# Patient Record
Sex: Male | Born: 1935 | Race: White | Hispanic: No | Marital: Married | Smoking: Former smoker
Health system: Southern US, Community
[De-identification: ages and names within clinical notes are randomized; demographics above are authoritative.]

## PROBLEM LIST (undated history)

## (undated) DIAGNOSIS — C61 Malignant neoplasm of prostate: Secondary | ICD-10-CM

## (undated) DIAGNOSIS — I480 Paroxysmal atrial fibrillation: Principal | ICD-10-CM

## (undated) DIAGNOSIS — E079 Disorder of thyroid, unspecified: Secondary | ICD-10-CM

## (undated) DIAGNOSIS — I499 Cardiac arrhythmia, unspecified: Secondary | ICD-10-CM

## (undated) DIAGNOSIS — I10 Essential (primary) hypertension: Secondary | ICD-10-CM

## (undated) DIAGNOSIS — D509 Iron deficiency anemia, unspecified: Secondary | ICD-10-CM

## (undated) DIAGNOSIS — M1990 Unspecified osteoarthritis, unspecified site: Secondary | ICD-10-CM

## (undated) DIAGNOSIS — E7800 Pure hypercholesterolemia, unspecified: Secondary | ICD-10-CM

## (undated) DIAGNOSIS — K5790 Diverticulosis of intestine, part unspecified, without perforation or abscess without bleeding: Secondary | ICD-10-CM

## (undated) DIAGNOSIS — J45909 Unspecified asthma, uncomplicated: Secondary | ICD-10-CM

## (undated) DIAGNOSIS — Z889 Allergy status to unspecified drugs, medicaments and biological substances status: Secondary | ICD-10-CM

## (undated) HISTORY — DX: Essential (primary) hypertension: I10

## (undated) HISTORY — DX: Malignant neoplasm of prostate: C61

## (undated) HISTORY — PX: RETINAL DETACHMENT SURGERY: SHX105

## (undated) HISTORY — DX: Disorder of thyroid, unspecified: E07.9

## (undated) HISTORY — PX: APPENDECTOMY: SHX54

## (undated) HISTORY — DX: Pure hypercholesterolemia, unspecified: E78.00

## (undated) HISTORY — DX: Paroxysmal atrial fibrillation: I48.0

## (undated) HISTORY — DX: Iron deficiency anemia, unspecified: D50.9

## (undated) HISTORY — DX: Allergy status to unspecified drugs, medicaments and biological substances: Z88.9

## (undated) HISTORY — DX: Unspecified osteoarthritis, unspecified site: M19.90

## (undated) HISTORY — PX: HERNIA REPAIR: SHX51

## (undated) HISTORY — DX: Diverticulosis of intestine, part unspecified, without perforation or abscess without bleeding: K57.90

## (undated) HISTORY — DX: Unspecified asthma, uncomplicated: J45.909

---

## 1998-11-25 ENCOUNTER — Ambulatory Visit (HOSPITAL_COMMUNITY): Admission: RE | Admit: 1998-11-25 | Discharge: 1998-11-26 | Payer: Self-pay | Admitting: Ophthalmology

## 1998-11-25 ENCOUNTER — Encounter: Payer: Self-pay | Admitting: Ophthalmology

## 2001-04-14 ENCOUNTER — Ambulatory Visit (HOSPITAL_COMMUNITY): Admission: RE | Admit: 2001-04-14 | Discharge: 2001-04-14 | Payer: Self-pay | Admitting: Gastroenterology

## 2001-04-14 ENCOUNTER — Encounter (INDEPENDENT_AMBULATORY_CARE_PROVIDER_SITE_OTHER): Payer: Self-pay | Admitting: Specialist

## 2001-10-17 ENCOUNTER — Ambulatory Visit (HOSPITAL_BASED_OUTPATIENT_CLINIC_OR_DEPARTMENT_OTHER): Admission: RE | Admit: 2001-10-17 | Discharge: 2001-10-17 | Payer: Self-pay | Admitting: Surgery

## 2010-09-04 ENCOUNTER — Ambulatory Visit
Admission: RE | Admit: 2010-09-04 | Discharge: 2010-12-03 | Payer: Self-pay | Source: Home / Self Care | Attending: Radiation Oncology | Admitting: Radiation Oncology

## 2010-09-14 ENCOUNTER — Ambulatory Visit (HOSPITAL_COMMUNITY): Admission: RE | Admit: 2010-09-14 | Discharge: 2010-09-14 | Payer: Self-pay | Admitting: Urology

## 2010-10-23 ENCOUNTER — Ambulatory Visit (HOSPITAL_BASED_OUTPATIENT_CLINIC_OR_DEPARTMENT_OTHER)
Admission: RE | Admit: 2010-10-23 | Discharge: 2010-10-23 | Payer: Self-pay | Source: Home / Self Care | Admitting: Urology

## 2010-12-05 ENCOUNTER — Ambulatory Visit
Admission: RE | Admit: 2010-12-05 | Discharge: 2011-01-09 | Payer: Self-pay | Source: Home / Self Care | Attending: Radiation Oncology | Admitting: Radiation Oncology

## 2011-01-10 ENCOUNTER — Ambulatory Visit: Payer: Medicare Other | Admitting: Radiation Oncology

## 2011-02-20 LAB — CBC
HCT: 41.2 % (ref 39.0–52.0)
Hemoglobin: 14.1 g/dL (ref 13.0–17.0)
MCH: 33.3 pg (ref 26.0–34.0)
MCHC: 34.1 g/dL (ref 30.0–36.0)
MCV: 97.6 fL (ref 78.0–100.0)
Platelets: 318 10*3/uL (ref 150–400)
RBC: 4.22 MIL/uL (ref 4.22–5.81)
RDW: 12.9 % (ref 11.5–15.5)
WBC: 7.3 10*3/uL (ref 4.0–10.5)

## 2011-02-20 LAB — COMPREHENSIVE METABOLIC PANEL
ALT: 21 U/L (ref 0–53)
AST: 31 U/L (ref 0–37)
Albumin: 3.8 g/dL (ref 3.5–5.2)
Alkaline Phosphatase: 73 U/L (ref 39–117)
BUN: 26 mg/dL — ABNORMAL HIGH (ref 6–23)
CO2: 29 mEq/L (ref 19–32)
Calcium: 9.4 mg/dL (ref 8.4–10.5)
Chloride: 96 mEq/L (ref 96–112)
Creatinine, Ser: 1.43 mg/dL (ref 0.4–1.5)
GFR calc Af Amer: 58 mL/min — ABNORMAL LOW (ref >60)
GFR calc non Af Amer: 48 mL/min — ABNORMAL LOW (ref >60)
Glucose, Bld: 101 mg/dL — ABNORMAL HIGH (ref 70–99)
Potassium: 4.3 mEq/L (ref 3.5–5.1)
Sodium: 134 mEq/L — ABNORMAL LOW (ref 135–145)
Total Bilirubin: 0.7 mg/dL (ref 0.3–1.2)
Total Protein: 7.3 g/dL (ref 6.0–8.3)

## 2011-02-20 LAB — PROTIME-INR
INR: 0.93 (ref 0.00–1.49)
Prothrombin Time: 12.7 seconds (ref 11.6–15.2)

## 2011-04-27 NOTE — Procedures (Signed)
[place]  Patient:    Roy David, Roy David                         MRN: [id number] Proc. Date: 04/14/01 Adm. Date:  [id number] Attending:  Rich Brave CC:         Abran Cantor. Clovis Riley, M.D.   Procedure Report  PROCEDURE:  Colonoscopy with biopsy.  INDICATION:  A 77 year old with Hemoccult-positive stool and a history of intermittent small-volume hematochezia.  He is on Advil twice a day.  FINDINGS:  Diminutive polyp at 40 cm.  Extensive sigmoid diverticulosis.  DESCRIPTION OF PROCEDURE:  The nature, purpose, and risks of the procedure had been discussed with the patient, who provided written consent.  Sedation was fentanyl 75 mcg and Versed 6 mg IV prior to the procedure without arrhythmias or desaturation.  Digital exam of the prostate was unremarkable.  The Olympus adult video colonoscope was advanced through a somewhat fixated, tortuous sigmoid region due to diverticular disease, but with the patient on his back and some external abdominal compression, we were able to reach the cecum and then advance for a short distance into a normal-appearing terminal ileum, whereupon pullback was initiated.  The quality of the prep was very good, and it is felt that all areas were well-seen.  At about 40 cm, I encountered what appeared to be a sessile 3 mm hyperplastic-appearing polyp, although it may have simply been a mucosal tag. I cold biopsied it one time, and this seemed to essentially excise it.  There was quite a bit of sigmoid diverticular disease.  Retroflexion in the rectum was unremarkable.  Pullout through the anal canal demonstrated mild internal hemorrhoids.  No large polyps, cancer, colitis, or vascular malformations were observed during this exam, and the patient tolerated it well.  There were no apparent complications.  IMPRESSION: 1. Probable colon polyp. 2. Sigmoid diverticulosis. 3. Mild internal hemorrhoids, which might  account for intermittent    small-volume hematochezia. 4. No evident cause for heme-positive stool seen.  It is presumed to be due    to gastric irritation from his Advil.  PLAN:  Await pathology on the polyp with consideration for repeat colonoscopy in five years if it is adenomatous in Editor, commissioning. DD:  04/14/01 TD:  04/15/01 Job: [id number]

## 2011-04-27 NOTE — Op Note (Signed)
[place]  Patient:    Roy David, Roy David Visit Number: [id number] MRN: [id number]          Service Type: DSU Location: [facility] Attending Physician:  Charlton Haws Dictated by:   Currie Paris, M.D. Proc. Date: 10/17/01 Admit Date:  10/17/2001   CC:         Melvyn Neth D. Clovis Riley, M.D.   Operative Report  VISIT NUMBER:  [id number]  CCS NUMBER:  [id number]  PREOPERATIVE DIAGNOSIS:  Right inguinal hernia.  POSTOPERATIVE DIAGNOSIS:  Right inguinal hernia.  OPERATION:  Repair of right inguinal hernia with mesh.  SURGEON:  Currie Paris, M.D.  ANESTHESIA:  MAC.  CLINICAL HISTORY:  The patient is a 77 year old who has had a prior left inguinal hernia, and had now presented with a right inguinal hernia which was symptomatic, and he wished to have fixed.  DESCRIPTION OF PROCEDURE:  The patient was seen in the holding area and the operative side identified and marked.  He was taken to the operating room.  He was given IV sedation.  The groin area was prepped and draped.  A combination of 1% Xylocaine with epinephrine with 0.5% Marcaine plain was mixed and used for local.  It was infiltrated along the incision line in a subfascial level at the anterior-superior iliac spine.  A skin incision was made and deepened to the external oblique aponeurosis with bleeders either tied with 4-0 Vicryl or electrocoagulated.  The external oblique was opened in line of its fibers.  The cord was dissected up off the inguinal floor.  There was what appeared to be some preperitoneal fat protruding through the ring, right in the area where there was a hernia, and I did strip this off and simply reduced it, and there appeared to be an indirect defect.  However, the inguinal floor was also quite attenuated.  I took a small mesh plug and actually took one of the three layers off to make it a little smaller, and used it to occlude the deep ring, and held it  in place with a 2-0 Prolene.  The fascia was then overlaid and sutured in with a running suture starting medially and running to the level of the deep ring, and then tacked along the medial aspect, and the superior aspect with several sutures.  Tails were crossed to go around the cord and tacked laterally.  This produced a nice coverage in a tension-free manner.  Everything appeared to be dry.  The external oblique was closed over with 3-0 Vicryl, the Scarpas with 3-0 Vicryl, and the skin with 4-0 Monocryl subcuticular plus Steri-Strips.  The patient tolerated the procedure well.  There were no operative complications, and all counts were correct. Dictated by:   Currie Paris, M.D. Attending Physician:  Charlton Haws DD:  10/17/01 TD:  10/19/01 Job: [id number]

## 2011-12-06 ENCOUNTER — Encounter: Payer: Self-pay | Admitting: *Deleted

## 2011-12-06 NOTE — Progress Notes (Signed)
I-PSS results 09/04/10=1111112 score=8

## 2014-10-01 ENCOUNTER — Other Ambulatory Visit (HOSPITAL_COMMUNITY): Payer: Self-pay | Admitting: Family Medicine

## 2014-10-01 ENCOUNTER — Ambulatory Visit (HOSPITAL_COMMUNITY): Payer: Medicare Other | Attending: Cardiology | Admitting: Cardiology

## 2014-10-01 DIAGNOSIS — I350 Nonrheumatic aortic (valve) stenosis: Secondary | ICD-10-CM

## 2014-10-01 DIAGNOSIS — I10 Essential (primary) hypertension: Secondary | ICD-10-CM | POA: Insufficient documentation

## 2014-10-01 DIAGNOSIS — E785 Hyperlipidemia, unspecified: Secondary | ICD-10-CM | POA: Diagnosis not present

## 2014-10-01 NOTE — Progress Notes (Signed)
Echo performed. 

## 2014-11-19 ENCOUNTER — Other Ambulatory Visit: Payer: Self-pay | Admitting: Physician Assistant

## 2015-02-25 ENCOUNTER — Other Ambulatory Visit: Payer: Self-pay | Admitting: Gastroenterology

## 2015-08-17 ENCOUNTER — Other Ambulatory Visit (HOSPITAL_COMMUNITY): Payer: Self-pay | Admitting: Ophthalmology

## 2015-08-17 ENCOUNTER — Ambulatory Visit (HOSPITAL_COMMUNITY)
Admission: RE | Admit: 2015-08-17 | Discharge: 2015-08-17 | Disposition: A | Discharge status: Home / Self Care | Payer: Medicare Other | Source: Ambulatory Visit | Attending: Ophthalmology | Admitting: Ophthalmology

## 2015-08-17 DIAGNOSIS — H539 Unspecified visual disturbance: Secondary | ICD-10-CM

## 2015-08-17 DIAGNOSIS — H538 Other visual disturbances: Secondary | ICD-10-CM | POA: Diagnosis not present

## 2015-08-17 NOTE — Progress Notes (Signed)
VASCULAR LAB PRELIMINARY  PRELIMINARY  PRELIMINARY  PRELIMINARY  Carotid duplex  completed.    Preliminary report:  Bilateral:  1-39% ICA stenosis.  Vertebral artery flow is antegrade.      Dishon Kehoe, RVT 08/17/2015, 3:48 PM

## 2015-09-13 ENCOUNTER — Telehealth: Payer: Self-pay | Admitting: Cardiovascular Disease

## 2015-09-13 NOTE — Telephone Encounter (Signed)
Received records from Eugenio Saenz for appointment on 10/17/15 with Dr Sallyanne Kuster.  Records given to [facility] (medical records) for Dr Croitoru's schedule on 10/17/15. lp

## 2015-10-17 ENCOUNTER — Ambulatory Visit: Payer: Medicare Other | Admitting: Cardiovascular Disease

## 2015-10-31 ENCOUNTER — Ambulatory Visit: Payer: Medicare Other | Admitting: Cardiovascular Disease

## 2015-11-23 ENCOUNTER — Encounter: Payer: Self-pay | Admitting: Cardiovascular Disease

## 2015-11-23 ENCOUNTER — Ambulatory Visit (INDEPENDENT_AMBULATORY_CARE_PROVIDER_SITE_OTHER): Payer: Medicare Other | Admitting: Cardiovascular Disease

## 2015-11-23 VITALS — BP 134/86 | HR 83 | Ht 68.0 in | Wt 158.0 lb

## 2015-11-23 DIAGNOSIS — I10 Essential (primary) hypertension: Secondary | ICD-10-CM

## 2015-11-23 DIAGNOSIS — I350 Nonrheumatic aortic (valve) stenosis: Secondary | ICD-10-CM

## 2015-11-23 DIAGNOSIS — H3411 Central retinal artery occlusion, right eye: Secondary | ICD-10-CM

## 2015-11-23 DIAGNOSIS — E785 Hyperlipidemia, unspecified: Secondary | ICD-10-CM

## 2015-11-23 DIAGNOSIS — E7800 Pure hypercholesterolemia, unspecified: Secondary | ICD-10-CM | POA: Insufficient documentation

## 2015-11-23 NOTE — Patient Instructions (Addendum)
Your physician has recommended that you receive an implantable loop recorder.  Loop recorders are medical devices that record the heart's electrical activity. Doctors most often use these monitors to diagnose arrhythmias, especially to diagnose the cause for unexplained stroke. Arrhythmias are problems with the speed or rhythm of the heartbeat. The monitor is a small device,  Implanted under the skin of the chest using local anesthesia.   Your physician recommends that you schedule a follow-up appointment in:  3 months

## 2015-11-23 NOTE — Addendum Note (Signed)
Addended by: Sanda Klein on: 11/23/2015 04:38 PM   Modules accepted: Orders

## 2015-11-23 NOTE — Progress Notes (Signed)
Cardiology Office Note   Date:  11/23/2015   ID:  Roy David, DOB 08/02/1930, MRN [id number]  PCP:  Roy Coffin, MD  Cardiologist:   Roy Casino, MD   Chief Complaint  Patient presents with  . New Evaluation    Early September visual disturbance right eye.  Seen by Roy. Ellie David and told he did not have a detached retina.  Carotid doppler done showed less than 39% blockage.      History of Present Illness: Roy David is a 81 y.o. male who presents for evaluation of transient vision loss. He was seen by Roy. Ellie David and thought not to have a detatched retina. He subsequently was send to have carotid dopplers, due to concern for retinal artery occlusion, which showed very minimal carotid disease. He has a history of hypertension, dyslipidemia, detatched retina in the past, hypothyroidism, seasonal allergies, bilateral knee OA and history of prostate cancer in 2011 (with radiation seed implant). Currently, he reports some central vision loss. He denies any chest pain, shortness of breath, palpitations, presyncope or syncope. He was told that he had a heart murmur about 14 years ago - he did have an echocardiogram in 09/2014, which demonstrated moderate aortic stenosis and mild AI, EF 60%. He was not taking daily aspirin at the time of the event.  Past Medical History  Diagnosis Date  . Hypertension   . Hypercholesteremia   . Thyroid disease     hypothyroidism  . H/O seasonal allergies     Roy Roy David  . Asthma   . Diverticulosis   . Arthritis   . Prostate cancer [facility])     12/10- Roy David/Roy David    No past surgical history on file.  Current Outpatient Prescriptions  Medication Sig Dispense Refill  . aspirin 81 MG tablet Take 81 mg by mouth daily.    Marland Kitchen ezetimibe-simvastatin (VYTORIN) 10-20 MG per tablet Take 1 tablet by mouth daily.    Marland Kitchen levothyroxine (SYNTHROID, LEVOTHROID) 112 MCG tablet Take 1 tablet by mouth daily.    Marland Kitchen lisinopril-hydrochlorothiazide  (PRINZIDE,ZESTORETIC) 20-12.5 MG per tablet Take 1 tablet by mouth daily.    . mometasone (ASMANEX 60 METERED DOSES) 220 MCG/INH inhaler Inhale 1 Inhaler into the lungs daily.     No current facility-administered medications for this visit.    Allergies:   Review of patient's allergies indicates no known allergies.   Social History:  The patient  reports that he quit smoking about 55 years ago. He does not have any smokeless tobacco history on file.   Family History:  The patient's family history includes Heart attack in his mother; Stroke in his father and sister.   ROS:   Please see the history of present illness.    Review of Systems  Respiratory: Positive for cough.    All other systems reviewed and are negative.   PHYSICAL EXAM: VS:  BP 134/86 mmHg  Pulse 83  Ht 5\' 8"  (1.727 m)  Wt 158 lb (71.668 kg)  BMI 24.03 kg/m2 , Body mass index is 24.03 kg/(m^2). GEN: Well nourished, well developed, in no acute distress HEENT: normal Neck: no JVD, bilateteral carotid bruits or masses Cardiac: RRR; early peaking 2/6 SEM, rubs, or gallops,no edema  Respiratory:  clear to auscultation bilaterally, normal work of breathing GI: soft, nontender, nondistended, + BS MS: no deformity or atrophy Skin: warm and dry, no rash Neuro:  Alert and Oriented x 3, Strength and sensation are intact Psych: euthymic mood, full  affect   EKG:  EKG is ordered today.  The ekg ordered today demonstrates  Normal sinus rhythm with first-degree AV block  Recent Labs: No results found for requested labs within last 365 days.   Lipid Panel  HDL 73, LDL 67, total cholesterol 153, triglycerides 68, creatinine 1.22   Wt Readings from Last 3 Encounters:  11/23/15 158 lb (71.668 kg)     Other studies Reviewed: Additional studies/ records that were reviewed today include: carotid duplex US. Review of the above records demonstrates: Summary:  Carotid doppler (08/17/2015): - The vertebral arteries appear  patent with antegrade flow. - Findings consistent with 1-39 percent stenosis involving the  right internal carotid artery and the left internal carotid  artery.  Echocardiogram (10/01/14):  Study Conclusions  - Left ventricle: The cavity size was normal. There was mild focal basal hypertrophy of the septum. Systolic function was normal. The estimated ejection fraction was in the range of 55% to 60%. Wall motion was normal; there were no regional wall motion abnormalities. Doppler parameters are consistent with abnormal left ventricular relaxation (grade 1 diastolic dysfunction). Doppler parameters are consistent with high ventricular filling pressure. - Aortic valve: Cusp separation was reduced. There was moderate stenosis. There was mild regurgitation. Mean gradient (S): 20 mm Hg. Valve area (Vmax): 1.17 cm^2. - Mitral valve: Moderately calcified annulus. - Left atrium: The atrium was mildly dilated. - Right ventricle: The cavity size was mildly dilated. Wall thickness was normal.  ASSESSMENT AND PLAN:  1. Central retinal artery occlusion of right eye   2. Central retinal artery occlusion of right eye   3. Moderate aortic valve stenosis   4. Essential hypertension   5. Hyperlipidemia     Roy. David is describing right eye central retinal artery occlusion , the equivalent of a stroke. Carotid duplex ultrasonography has not shown a cause. He has well treated hypertension and hyperlipidemia. Consider asymptomatic atrial fibrillation. We discussed options of wearable event monitor versus implantable loop recorder , I recommended the latter. The procedure was discussed in detail and he agrees to proceed with it.This procedure has been fully reviewed with the patient and written informed consent has been obtained.   He has clear evidence of moderate aortic valve stenosis by echocardiography and physical exam, but this is currently asymptomatic disorder, unlikely to  be related to his visual problem.   He has well treated hypertension and hyperlipidemia.   Medication Adjustments/Labs and Tests Ordered: Current medicines are reviewed at length with the patient today.  Concerns regarding medicines are outlined above.  Labs/Tests ordered today are listed below. Patient Instructions  Your physician has recommended that you receive an implantable loop recorder.  Loop recorders are medical devices that record the heart's electrical activity. Doctors most often use these monitors to diagnose arrhythmias, especially to diagnose the cause for unexplained stroke. Arrhythmias are problems with the speed or rhythm of the heartbeat. The monitor is a small device,  Implanted under the skin of the chest using local anesthesia.   Your physician recommends that you schedule a follow-up appointment in:  3 months     Signed, Roy Casino, MD  11/23/2015 3:27 PM    [place]

## 2015-11-25 ENCOUNTER — Other Ambulatory Visit: Payer: Self-pay | Admitting: *Deleted

## 2015-11-29 ENCOUNTER — Encounter (HOSPITAL_COMMUNITY)
Admission: RE | Disposition: A | Discharge status: Home / Self Care | Payer: Self-pay | Source: Ambulatory Visit | Attending: Cardiovascular Disease

## 2015-11-29 ENCOUNTER — Ambulatory Visit (HOSPITAL_COMMUNITY)
Admission: RE | Admit: 2015-11-29 | Discharge: 2015-11-29 | Disposition: A | Discharge status: Home / Self Care | Payer: Medicare Other | Source: Ambulatory Visit | Attending: Cardiovascular Disease | Admitting: Cardiovascular Disease

## 2015-11-29 DIAGNOSIS — E039 Hypothyroidism, unspecified: Secondary | ICD-10-CM | POA: Insufficient documentation

## 2015-11-29 DIAGNOSIS — I639 Cerebral infarction, unspecified: Secondary | ICD-10-CM | POA: Diagnosis not present

## 2015-11-29 DIAGNOSIS — Z8546 Personal history of malignant neoplasm of prostate: Secondary | ICD-10-CM | POA: Insufficient documentation

## 2015-11-29 DIAGNOSIS — J45909 Unspecified asthma, uncomplicated: Secondary | ICD-10-CM | POA: Diagnosis not present

## 2015-11-29 DIAGNOSIS — E7800 Pure hypercholesterolemia, unspecified: Secondary | ICD-10-CM | POA: Diagnosis not present

## 2015-11-29 DIAGNOSIS — Z7982 Long term (current) use of aspirin: Secondary | ICD-10-CM | POA: Insufficient documentation

## 2015-11-29 DIAGNOSIS — G459 Transient cerebral ischemic attack, unspecified: Secondary | ICD-10-CM | POA: Diagnosis not present

## 2015-11-29 DIAGNOSIS — I350 Nonrheumatic aortic (valve) stenosis: Secondary | ICD-10-CM | POA: Insufficient documentation

## 2015-11-29 DIAGNOSIS — H3410 Central retinal artery occlusion, unspecified eye: Secondary | ICD-10-CM | POA: Insufficient documentation

## 2015-11-29 DIAGNOSIS — Z87891 Personal history of nicotine dependence: Secondary | ICD-10-CM | POA: Insufficient documentation

## 2015-11-29 DIAGNOSIS — M170 Bilateral primary osteoarthritis of knee: Secondary | ICD-10-CM | POA: Diagnosis not present

## 2015-11-29 DIAGNOSIS — H3411 Central retinal artery occlusion, right eye: Secondary | ICD-10-CM | POA: Diagnosis present

## 2015-11-29 DIAGNOSIS — I10 Essential (primary) hypertension: Secondary | ICD-10-CM | POA: Diagnosis not present

## 2015-11-29 HISTORY — PX: EP IMPLANTABLE DEVICE: SHX172B

## 2015-11-29 SURGERY — LOOP RECORDER INSERTION
Anesthesia: LOCAL

## 2015-11-29 MED ORDER — LIDOCAINE HCL (PF) 1 % IJ SOLN
INTRAMUSCULAR | Status: DC | PRN
  Administered 2015-11-29: 20 mL

## 2015-11-29 SURGICAL SUPPLY — 2 items
LOOP REVEAL LINQSYS (Prosthesis & Implant Heart) ×2 IMPLANT
PACK LOOP INSERTION (CUSTOM PROCEDURE TRAY) ×2 IMPLANT

## 2015-11-29 NOTE — Interval H&P Note (Signed)
History and Physical Interval Note:  11/29/2015 1:30 PM  Roy David  has presented today for surgery, with the diagnosis of Palpations, blurred vision  The various methods of treatment have been discussed with the patient and family. After consideration of risks, benefits and other options for treatment, the patient has consented to  Procedure(s): Loop Recorder Insertion (N/A) as a surgical intervention .  The patient's history has been reviewed, patient examined, no change in status, stable for surgery.  I have reviewed the patient's chart and labs.  Questions were answered to the patient's satisfaction.     Leaf [employer]

## 2015-11-29 NOTE — Op Note (Addendum)
LOOP RECORDER IMPLANT    Procedure performed:  Loop recorder implantation   Reason for procedure:  1. Cryptogenic stroke equivalent - central retinal artery embolic occlusion Procedure performed by:  Sanda Klein, MD  Complications:  None  Estimated blood loss:  <5 mL  Medications administered during procedure:  Lidocaine 1% with 1/10,000 epinephrine 10 mL locally Device details:  Medtronic Reveal Linq model number [id number], serial number [id number] Procedure details:  After the risks and benefits of the procedure were discussed the patient provided informed consent. The patient was prepped and draped in usual sterile fashion. Local anesthesia was administered to an area 2 cm to the left of the sternum in the 4th intercostal space. A cutaneous incision was made using the incision tool. The introducer was then used to create a subcutaneous tunnel and carefully deploy the device. Local pressure was held to ensure hemostasis.  The incision was closed with SteriStrips and a sterile dressing was applied.  R waves 0.39 mV  Sanda Klein, MD, [facility] [phone] office [phone] pager 11/29/2015 3:56 PM

## 2015-11-29 NOTE — Progress Notes (Signed)
Procedure completed.  Pt tolerated well. Discharge instructions given verbally and in writing. Pt and family verbalize understanding.  Pt discharged to home with wife.

## 2015-11-29 NOTE — H&P (View-Only) (Signed)
Cardiology Office Note   Date:  11/23/2015   ID:  Roy David, DOB Feb 17, 1930, MRN [id number]  PCP:  Roy Coffin, MD  Cardiologist:   Roy Casino, MD   Chief Complaint  Patient presents with  . New Evaluation    Early September visual disturbance right eye.  Seen by Dr. Ellie Lunch and told he did not have a detached retina.  Carotid doppler done showed less than 39% blockage.      History of Present Illness: Roy David is a 81 y.o. male who presents for evaluation of transient vision loss. He was seen by Dr. Ellie Lunch and thought not to have a detatched retina. He subsequently was send to have carotid dopplers, due to concern for retinal artery occlusion, which showed very minimal carotid disease. He has a history of hypertension, dyslipidemia, detatched retina in the past, hypothyroidism, seasonal allergies, bilateral knee OA and history of prostate cancer in 2011 (with radiation seed implant). Currently, he reports some central vision loss. He denies any chest pain, shortness of breath, palpitations, presyncope or syncope. He was told that he had a heart murmur about 14 years ago - he did have an echocardiogram in 09/2014, which demonstrated moderate aortic stenosis and mild AI, EF 60%. He was not taking daily aspirin at the time of the event.  Past Medical History  Diagnosis Date  . Hypertension   . Hypercholesteremia   . Thyroid disease     hypothyroidism  . H/O seasonal allergies     Dr Roy David  . Asthma   . Diverticulosis   . Arthritis   . Prostate cancer [facility])     12/10- Dr Roy David/Dr Roy David    No past surgical history on file.  Current Outpatient Prescriptions  Medication Sig Dispense Refill  . aspirin 81 MG tablet Take 81 mg by mouth daily.    Marland Kitchen ezetimibe-simvastatin (VYTORIN) 10-20 MG per tablet Take 1 tablet by mouth daily.    Marland Kitchen levothyroxine (SYNTHROID, LEVOTHROID) 112 MCG tablet Take 1 tablet by mouth daily.    Marland Kitchen lisinopril-hydrochlorothiazide  (PRINZIDE,ZESTORETIC) 20-12.5 MG per tablet Take 1 tablet by mouth daily.    . mometasone (ASMANEX 60 METERED DOSES) 220 MCG/INH inhaler Inhale 1 Inhaler into the lungs daily.     No current facility-administered medications for this visit.    Allergies:   Review of patient's allergies indicates no known allergies.   Social History:  The patient  reports that he quit smoking about 55 years ago. He does not have any smokeless tobacco history on file.   Family History:  The patient's family history includes Heart attack in his mother; Stroke in his father and sister.   ROS:   Please see the history of present illness.    Review of Systems  Respiratory: Positive for cough.    All other systems reviewed and are negative.   PHYSICAL EXAM: VS:  BP 134/86 mmHg  Pulse 83  Ht 5\' 8"  (1.727 m)  Wt 158 lb (71.668 kg)  BMI 24.03 kg/m2 , Body mass index is 24.03 kg/(m^2). GEN: Well nourished, well developed, in no acute distress HEENT: normal Neck: no JVD, bilateteral carotid bruits or masses Cardiac: RRR; early peaking 2/6 SEM, rubs, or gallops,no edema  Respiratory:  clear to auscultation bilaterally, normal work of breathing GI: soft, nontender, nondistended, + BS MS: no deformity or atrophy Skin: warm and dry, no rash Neuro:  Alert and Oriented x 3, Strength and sensation are intact Psych: euthymic mood, full  affect   EKG:  EKG is ordered today.  The ekg ordered today demonstrates  Normal sinus rhythm with first-degree AV block  Recent Labs: No results found for requested labs within last 365 days.   Lipid Panel  HDL 73, LDL 67, total cholesterol 153, triglycerides 68, creatinine 1.22   Wt Readings from Last 3 Encounters:  11/23/15 158 lb (71.668 kg)     Other studies Reviewed: Additional studies/ records that were reviewed today include: carotid duplex US. Review of the above records demonstrates: Summary:  Carotid doppler (08/17/2015): - The vertebral arteries appear  patent with antegrade flow. - Findings consistent with 1-39 percent stenosis involving the  right internal carotid artery and the left internal carotid  artery.  Echocardiogram (10/01/14):  Study Conclusions  - Left ventricle: The cavity size was normal. There was mild focal basal hypertrophy of the septum. Systolic function was normal. The estimated ejection fraction was in the range of 55% to 60%. Wall motion was normal; there were no regional wall motion abnormalities. Doppler parameters are consistent with abnormal left ventricular relaxation (grade 1 diastolic dysfunction). Doppler parameters are consistent with high ventricular filling pressure. - Aortic valve: Cusp separation was reduced. There was moderate stenosis. There was mild regurgitation. Mean gradient (S): 20 mm Hg. Valve area (Vmax): 1.17 cm^2. - Mitral valve: Moderately calcified annulus. - Left atrium: The atrium was mildly dilated. - Right ventricle: The cavity size was mildly dilated. Wall thickness was normal.  ASSESSMENT AND PLAN:  1. Central retinal artery occlusion of right eye   2. Central retinal artery occlusion of right eye   3. Moderate aortic valve stenosis   4. Essential hypertension   5. Hyperlipidemia     [name] is describing right eye central retinal artery occlusion , the equivalent of a stroke. Carotid duplex ultrasonography has not shown a cause. He has well treated hypertension and hyperlipidemia. Consider asymptomatic atrial fibrillation. We discussed options of wearable event monitor versus implantable loop recorder , I recommended the latter. The procedure was discussed in detail and he agrees to proceed with it.This procedure has been fully reviewed with the patient and written informed consent has been obtained.   He has clear evidence of moderate aortic valve stenosis by echocardiography and physical exam, but this is currently asymptomatic disorder, unlikely to  be related to his visual problem.   He has well treated hypertension and hyperlipidemia.   Medication Adjustments/Labs and Tests Ordered: Current medicines are reviewed at length with the patient today.  Concerns regarding medicines are outlined above.  Labs/Tests ordered today are listed below. Patient Instructions  Your physician has recommended that you receive an implantable loop recorder.  Loop recorders are medical devices that record the heart's electrical activity. Doctors most often use these monitors to diagnose arrhythmias, especially to diagnose the cause for unexplained stroke. Arrhythmias are problems with the speed or rhythm of the heartbeat. The monitor is a small device,  Implanted under the skin of the chest using local anesthesia.   Your physician recommends that you schedule a follow-up appointment in:  3 months     Signed, Roy Casino, MD  11/23/2015 3:27 PM    [place]

## 2015-11-29 NOTE — Discharge Instructions (Signed)
Loop instructions given to family and pt

## 2015-11-30 ENCOUNTER — Encounter (HOSPITAL_COMMUNITY): Payer: Self-pay | Admitting: Cardiovascular Disease

## 2015-12-15 ENCOUNTER — Ambulatory Visit (INDEPENDENT_AMBULATORY_CARE_PROVIDER_SITE_OTHER): Payer: Medicare Other | Admitting: *Deleted

## 2015-12-15 DIAGNOSIS — H3411 Central retinal artery occlusion, right eye: Secondary | ICD-10-CM

## 2015-12-15 LAB — CUP PACEART INCLINIC DEVICE CHECK: Date Time Interrogation Session: 20170105143436

## 2015-12-15 NOTE — Progress Notes (Signed)
Loop wound check appointment. Steri-strips previously removed by patient. Small amount dried blood cleaned from R lateral incision, incision well healed and without redness or edema. Slight healing purplish to yellow bruising noted below incision. Small red bump noted slightly below R lateral incision, no pain or abnormal warmth, possible ingrown hair from having chest shaved per patient. Chanetta Marshall, NP, checked site, advised washing area well with soap and water. Patient aware to call with any worsening symptoms or signs of infection at site. R-waves 0.70mV. No symptom, tachy, brady, pause, or AF episodes. Monthly summary reports and ROV with Vincent on 02/28/16 at 10:30am.

## 2015-12-22 NOTE — Addendum Note (Signed)
Addended by: Therisa Doyne on: 12/22/2015 08:04 AM   Modules accepted: Orders

## 2015-12-29 ENCOUNTER — Ambulatory Visit (INDEPENDENT_AMBULATORY_CARE_PROVIDER_SITE_OTHER): Payer: Medicare Other | Admitting: *Deleted

## 2015-12-29 DIAGNOSIS — I639 Cerebral infarction, unspecified: Secondary | ICD-10-CM | POA: Diagnosis not present

## 2015-12-30 NOTE — Progress Notes (Signed)
Carelink Summary Report / Loop Recorder 

## 2016-01-09 ENCOUNTER — Telehealth: Payer: Self-pay | Admitting: *Deleted

## 2016-01-09 ENCOUNTER — Encounter: Payer: Self-pay | Admitting: *Deleted

## 2016-01-09 ENCOUNTER — Encounter: Payer: Self-pay | Admitting: Cardiovascular Disease

## 2016-01-09 ENCOUNTER — Ambulatory Visit: Payer: Medicare Other | Admitting: Cardiovascular Disease

## 2016-01-09 NOTE — Telephone Encounter (Signed)
Opened in error.  See documentation encounter.

## 2016-01-09 NOTE — Progress Notes (Signed)
AF episodes from 01/06/16 and 01/07/16 printed for review. Episode from 01/06/16 appropriate, true AF, duration 16min, +ASA 81mg . Dr. Sallyanne Kuster made aware. Monthly summary reports and ROV with Dr. Sallyanne Kuster on 02/28/16 at 10:30am.

## 2016-01-09 NOTE — Progress Notes (Signed)
Called Roy David and discuss findings on loop recorder. We debated whether or not it is time to start true anticoagulation instead of just aspirin. After some discussion, decided to defer this decision until his follow-up appointment in March.

## 2016-01-17 ENCOUNTER — Encounter: Payer: Self-pay | Admitting: Cardiovascular Disease

## 2016-01-22 ENCOUNTER — Encounter: Payer: Self-pay | Admitting: Cardiovascular Disease

## 2016-01-23 DIAGNOSIS — J069 Acute upper respiratory infection, unspecified: Secondary | ICD-10-CM | POA: Diagnosis not present

## 2016-01-30 ENCOUNTER — Ambulatory Visit (INDEPENDENT_AMBULATORY_CARE_PROVIDER_SITE_OTHER): Payer: Medicare Other | Admitting: *Deleted

## 2016-01-30 DIAGNOSIS — I639 Cerebral infarction, unspecified: Secondary | ICD-10-CM

## 2016-02-01 NOTE — Progress Notes (Signed)
Carelink Summary Report / Loop Recorder 

## 2016-02-02 ENCOUNTER — Encounter: Payer: Self-pay | Admitting: Cardiovascular Disease

## 2016-02-06 ENCOUNTER — Encounter: Payer: Self-pay | Admitting: Cardiovascular Disease

## 2016-02-07 ENCOUNTER — Encounter: Payer: Self-pay | Admitting: Cardiovascular Disease

## 2016-02-17 LAB — CUP PACEART REMOTE DEVICE CHECK: MDC IDC SESS DTM: 20170119200521

## 2016-02-27 ENCOUNTER — Ambulatory Visit (INDEPENDENT_AMBULATORY_CARE_PROVIDER_SITE_OTHER): Payer: Medicare Other | Admitting: *Deleted

## 2016-02-27 DIAGNOSIS — I639 Cerebral infarction, unspecified: Secondary | ICD-10-CM

## 2016-02-28 ENCOUNTER — Encounter: Payer: Medicare Other | Admitting: Cardiovascular Disease

## 2016-02-28 NOTE — Progress Notes (Signed)
Carelink Summary Report / Loop Recorder 

## 2016-03-06 ENCOUNTER — Ambulatory Visit (INDEPENDENT_AMBULATORY_CARE_PROVIDER_SITE_OTHER): Payer: Medicare Other | Admitting: Cardiovascular Disease

## 2016-03-06 ENCOUNTER — Encounter: Payer: Self-pay | Admitting: Cardiovascular Disease

## 2016-03-06 VITALS — BP 168/92 | HR 100 | Ht 68.5 in | Wt 157.6 lb

## 2016-03-06 DIAGNOSIS — I350 Nonrheumatic aortic (valve) stenosis: Secondary | ICD-10-CM

## 2016-03-06 DIAGNOSIS — H3411 Central retinal artery occlusion, right eye: Secondary | ICD-10-CM

## 2016-03-06 DIAGNOSIS — E785 Hyperlipidemia, unspecified: Secondary | ICD-10-CM

## 2016-03-06 DIAGNOSIS — I480 Paroxysmal atrial fibrillation: Secondary | ICD-10-CM

## 2016-03-06 DIAGNOSIS — I10 Essential (primary) hypertension: Secondary | ICD-10-CM | POA: Diagnosis not present

## 2016-03-06 HISTORY — DX: Paroxysmal atrial fibrillation: I48.0

## 2016-03-06 MED ORDER — APIXABAN 5 MG PO TABS: 5.0000 mg | ORAL_TABLET | Freq: Two times a day (BID) | ORAL | Status: DC

## 2016-03-06 NOTE — Progress Notes (Signed)
Patient ID: Roy David, male   DOB: 30-Jul-1930, 82 y.o.   MRN: [id number]    Cardiology Office Note    Date:  03/06/2016   ID:  Roy David, DOB [birth year], MRN [id number]  PCP:  Donnie Coffin, MD  Cardiologist:   Sanda Klein, MD   Chief Complaint  Patient presents with  . Follow-up    pt states no chest pain    History of Present Illness:  Roy David is a 82 y.o. 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 The device has now recorded 3 separate episodes of brief paroxysmal atrial fibrillation. Most recently he had a 4 minute episode on March 23 and a 10 minute episode on March 26. Both episodes showed extremely irregular rhythm with very high likelihood that this is atrial fibrillation. On both occasions it was with rapid ventricular response, up to about 170 bpm. He was unaware of palpitations. He thinks at least one of the events happened while he was working in the yard. He denies syncope, dizziness, lightheadedness, shortness of breath or angina pectoris. He has not had any new ophthalmological or neurological problems.  Additional problems include hypertension, hypercholesterolemia and moderate aortic valve stenosis. His blood pressure is quite elevated today. He states that at home this morning's blood pressure was 133/68 mmHg, a typical blood pressure for him. He offers that his blood pressure is often elevated when he comes to the doctor's office.  Past Medical History  Diagnosis Date  . Hypertension   . Hypercholesteremia   . Thyroid disease     hypothyroidism  . H/O seasonal allergies     Dr [name]  . Asthma   . Diverticulosis   . Arthritis   . Prostate cancer [facility])     12/10- Dr Dahlstedt/Dr Valere Dross    Past Surgical History  Procedure Laterality Date  . Ep implantable device N/A 11/29/2015    Procedure: Loop Recorder Insertion;  Surgeon: Sanda Klein,  MD;  Location: [place] [facility];  Service: Cardiovascular;  Laterality: N/A;    Outpatient Prescriptions Prior to Visit  Medication Sig Dispense Refill  . albuterol (PROVENTIL HFA;VENTOLIN HFA) 108 (90 BASE) MCG/ACT inhaler Inhale 1 puff into the lungs every 6 (six) hours as needed for wheezing or shortness of breath.    Marland Kitchen aspirin 81 MG tablet Take 81 mg by mouth daily.    Marland Kitchen ezetimibe-simvastatin (VYTORIN) 10-20 MG per tablet Take 1 tablet by mouth daily.    . hydrOXYzine (ATARAX/VISTARIL) 10 MG tablet Reported on 12/15/2015  2  . levothyroxine (SYNTHROID, LEVOTHROID) 112 MCG tablet Take 1 tablet by mouth daily.    Marland Kitchen lisinopril-hydrochlorothiazide (PRINZIDE,ZESTORETIC) 20-25 MG tablet Take 1 tablet by mouth daily.    . mometasone (ASMANEX 60 METERED DOSES) 220 MCG/INH inhaler Inhale 1 Inhaler into the lungs daily.    Marland Kitchen triamcinolone cream (KENALOG) 0.1 % Reported on 12/15/2015  2   No facility-administered medications prior to visit.     Allergies:   Bee venom   Social History   Social History  . Marital Status: Married    Spouse Name: N/A  . Number of Children: N/A  . Years of Education: N/A   Social History Main Topics  . Smoking status: Former Smoker    Quit date: 12/09/1959  . Smokeless tobacco: None  . Alcohol Use: 3.6 oz/week    0 Standard drinks or equivalent, 6 Shots of liquor  per week  . Drug Use: None  . Sexual Activity: Not Asked   Other Topics Concern  . None   Social History Narrative   Tobacco use cigarettes : Former smoker, Quit in year 1955   Occupation : retired/  married     Family History:  The patient's family history includes Heart attack in his mother; Stroke in his father and sister.   ROS:   Please see the history of present illness.    ROS All other systems reviewed and are negative.   PHYSICAL EXAM:   VS:  BP 168/92 mmHg  Pulse 100  Ht 5' 8.5" (1.74 m)  Wt 71.487 kg (157 lb 9.6 oz)  BMI 23.61 kg/m2   GEN: Well nourished, well  developed, in no acute distress HEENT: normal Neck: no JVD, carotid bruits, or masses Cardiac: RRR; no murmurs, rubs, or gallops,no edema  Respiratory:  clear to auscultation bilaterally, normal work of breathing GI: soft, nontender, nondistended, + BS MS: no deformity or atrophy Skin: warm and dry, no rash Neuro:  Alert and Oriented x 3, Strength and sensation are intact Psych: euthymic mood, full affect  Wt Readings from Last 3 Encounters:  03/06/16 71.487 kg (157 lb 9.6 oz)  11/29/15 71.668 kg (158 lb)  11/23/15 71.668 kg (158 lb)      Studies/Labs Reviewed:   EKG:  EKG is not ordered today.     ASSESSMENT:    1. Paroxysmal atrial fibrillation (HCC)   2. History of recent right central artery occlusion   3. Essential hypertension   4. Hyperlipidemia      PLAN:  In order of problems listed above:  1. PAFib: He has now had 3 episodes of atrial fibrillation, all of them asymptomatic. His risk of future embolic events as high. CHADSVasc 5 (age 12, "CVA"  2, HTN). We discussed the relative value of aspirin versus true anticoagulants and prevention of future stroke or other embolic events. We also reviewed and compared the pros and cons of INR targeted warfarin anticoagulation versus a direct oral anticoagulant.  Discussed possible bleeding complications. In the end I recommended that he start treatment with Eliquis 5 mg twice daily. The burden of atrial fibrillation is low. Even though he has very fast ventricular response, at this point I did not recommend starting any rate control medications or antiarrhythmics. 2. History of embolism: His central retinal artery occlusion was likely a cardioembolic event, related to atrial fibrillation. 3. HTN: Blood pressure unusually high for him today, may be due to anxiety. Asked him to continue monitoring at home.Consider switching from lisinopril-hydrochlorothiazide to a beta blocker. 4. HLP: Target LDL less than 100   Medication  Adjustments/Labs and Tests Ordered: Current medicines are reviewed at length with the patient today.  Concerns regarding medicines are outlined above.  Medication changes, Labs and Tests ordered today are listed in the Patient Instructions below. Patient Instructions  Dr Sallyanne Kuster has recommended making the following medication changes: 1. START Eliquis 5 mg - take 1 tablet by mouth twice daily  Dr Sallyanne Kuster recommends that you schedule a follow-up appointment in 3 months.  If you need a refill on your cardiac medications before your next appointment, please call your pharmacy.   Medication samples have been provided to the patient. Drug name: Eliquis 5 mg Qty: 28 tabs LOT: [id number] Exp.Date: 04/2018  Donivan Scull 11:29 AM 03/06/2016      Mikael Spray, MD  03/06/2016 7:50 PM    [facility]  [address], [place], [place]  [address] Phone: [phone]; Fax: [phone]

## 2016-03-06 NOTE — Patient Instructions (Addendum)
Dr Sallyanne Kuster has recommended making the following medication changes: 1. START Eliquis 5 mg - take 1 tablet by mouth twice daily  Dr Sallyanne Kuster recommends that you schedule a follow-up appointment in 3 months.  If you need a refill on your cardiac medications before your next appointment, please call your pharmacy.   Medication samples have been provided to the patient. Drug name: Eliquis 5 mg Qty: 28 tabs LOT: [id number] Exp.Date: 04/2018  Roy David 11:29 AM 03/06/2016

## 2016-03-09 ENCOUNTER — Telehealth: Payer: Self-pay | Admitting: Cardiovascular Disease

## 2016-03-09 NOTE — Telephone Encounter (Signed)
Pt called in wanting to speak with a nurse about the Eliquis . He states that he has noticed some dark blood in his stool. Please f/u with him  Thanks
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
## 2016-03-12 ENCOUNTER — Encounter: Payer: Self-pay | Admitting: Cardiovascular Disease

## 2016-03-12 NOTE — Telephone Encounter (Signed)
A little bleeding after BM from hemmorhoids/irritation is a common complaint, as is gingival bleeding after brushing teeth, small nose bleeds and easy bruising. Please remind him that his device detected 3 separate episodes of AFib and that we are concerned that this was the cause of his stroke.  He should call back and stop Eliquis if he has bleeding between BMs or large amounts of bleeding after BM.

## 2016-03-13 NOTE — Telephone Encounter (Signed)
Returned call to patient, recommendations communicated.  Patient voiced understanding and thanks.

## 2016-03-28 ENCOUNTER — Ambulatory Visit (INDEPENDENT_AMBULATORY_CARE_PROVIDER_SITE_OTHER): Payer: Medicare Other | Admitting: *Deleted

## 2016-03-28 DIAGNOSIS — I639 Cerebral infarction, unspecified: Secondary | ICD-10-CM | POA: Diagnosis not present

## 2016-03-29 NOTE — Progress Notes (Signed)
Carelink Summary Report / Loop Recorder 

## 2016-04-03 DIAGNOSIS — E039 Hypothyroidism, unspecified: Secondary | ICD-10-CM | POA: Diagnosis not present

## 2016-04-03 DIAGNOSIS — I350 Nonrheumatic aortic (valve) stenosis: Secondary | ICD-10-CM | POA: Diagnosis not present

## 2016-04-03 DIAGNOSIS — E7800 Pure hypercholesterolemia, unspecified: Secondary | ICD-10-CM | POA: Diagnosis not present

## 2016-04-03 DIAGNOSIS — M150 Primary generalized (osteo)arthritis: Secondary | ICD-10-CM | POA: Diagnosis not present

## 2016-04-03 DIAGNOSIS — J4520 Mild intermittent asthma, uncomplicated: Secondary | ICD-10-CM | POA: Diagnosis not present

## 2016-04-03 DIAGNOSIS — I10 Essential (primary) hypertension: Secondary | ICD-10-CM | POA: Diagnosis not present

## 2016-04-13 LAB — CUP PACEART REMOTE DEVICE CHECK: MDC IDC SESS DTM: 20170218200626

## 2016-04-27 ENCOUNTER — Ambulatory Visit (INDEPENDENT_AMBULATORY_CARE_PROVIDER_SITE_OTHER): Payer: Medicare Other | Admitting: *Deleted

## 2016-04-27 DIAGNOSIS — I639 Cerebral infarction, unspecified: Secondary | ICD-10-CM | POA: Diagnosis not present

## 2016-04-30 NOTE — Progress Notes (Signed)
Carelink Summary Report / Loop Recorder 

## 2016-05-02 ENCOUNTER — Encounter: Payer: Self-pay | Admitting: Cardiovascular Disease

## 2016-05-04 NOTE — Progress Notes (Signed)
This encounter was created in error - please disregard.

## 2016-05-05 LAB — CUP PACEART REMOTE DEVICE CHECK: MDC IDC SESS DTM: 20170320203630

## 2016-05-05 NOTE — Progress Notes (Signed)
Carelink summary report received. Battery status OK. Normal device function. No new symptom episodes, tachy episodes, brady, or pause episodes. 1.5% AF burden, V rates overall slightly elevated. 3 tachy episodes are AF with RVR. +Eliquis. Monthly summary reports and ROV/PRN

## 2016-05-07 LAB — CUP PACEART REMOTE DEVICE CHECK: Date Time Interrogation Session: 20170419210618

## 2016-05-07 NOTE — Progress Notes (Signed)
Carelink summary report received. Battery status OK. Normal device function. No new symptom episodes, brady, or pause episodes. 0.7% AF, +Eliquis, V rates relatively well controlled. Monthly summary reports and ROV/PRN

## 2016-05-24 ENCOUNTER — Ambulatory Visit: Payer: Medicare Other | Admitting: Cardiovascular Disease

## 2016-05-28 ENCOUNTER — Ambulatory Visit (INDEPENDENT_AMBULATORY_CARE_PROVIDER_SITE_OTHER): Payer: Medicare Other | Admitting: *Deleted

## 2016-05-28 DIAGNOSIS — I639 Cerebral infarction, unspecified: Secondary | ICD-10-CM

## 2016-05-28 NOTE — Progress Notes (Signed)
Carelink Summary Report / Loop Recorder 

## 2016-06-06 LAB — CUP PACEART REMOTE DEVICE CHECK
Date Time Interrogation Session: 20170519213549
MDC IDC SESS DTM: 20170618213758

## 2016-06-19 DIAGNOSIS — E039 Hypothyroidism, unspecified: Secondary | ICD-10-CM | POA: Diagnosis not present

## 2016-06-26 ENCOUNTER — Ambulatory Visit (INDEPENDENT_AMBULATORY_CARE_PROVIDER_SITE_OTHER): Payer: Medicare Other | Admitting: *Deleted

## 2016-06-26 DIAGNOSIS — I639 Cerebral infarction, unspecified: Secondary | ICD-10-CM

## 2016-06-27 NOTE — Progress Notes (Signed)
Carelink Summary Report / Loop Recorder 

## 2016-07-19 LAB — CUP PACEART REMOTE DEVICE CHECK: Date Time Interrogation Session: 20170718223718

## 2016-07-25 ENCOUNTER — Ambulatory Visit: Payer: Medicare Other | Admitting: Cardiovascular Disease

## 2016-07-26 ENCOUNTER — Encounter: Payer: Self-pay | Admitting: Cardiovascular Disease

## 2016-07-26 ENCOUNTER — Encounter (INDEPENDENT_AMBULATORY_CARE_PROVIDER_SITE_OTHER): Payer: Self-pay

## 2016-07-26 ENCOUNTER — Ambulatory Visit (INDEPENDENT_AMBULATORY_CARE_PROVIDER_SITE_OTHER): Payer: Medicare Other | Admitting: *Deleted

## 2016-07-26 ENCOUNTER — Ambulatory Visit (INDEPENDENT_AMBULATORY_CARE_PROVIDER_SITE_OTHER): Payer: Medicare Other | Admitting: Cardiovascular Disease

## 2016-07-26 VITALS — BP 144/84 | HR 71 | Ht 68.0 in | Wt 155.0 lb

## 2016-07-26 DIAGNOSIS — I480 Paroxysmal atrial fibrillation: Secondary | ICD-10-CM | POA: Diagnosis not present

## 2016-07-26 DIAGNOSIS — I639 Cerebral infarction, unspecified: Secondary | ICD-10-CM | POA: Diagnosis not present

## 2016-07-26 DIAGNOSIS — I10 Essential (primary) hypertension: Secondary | ICD-10-CM | POA: Diagnosis not present

## 2016-07-26 DIAGNOSIS — I350 Nonrheumatic aortic (valve) stenosis: Secondary | ICD-10-CM

## 2016-07-26 DIAGNOSIS — Z4509 Encounter for adjustment and management of other cardiac device: Secondary | ICD-10-CM

## 2016-07-26 DIAGNOSIS — E785 Hyperlipidemia, unspecified: Secondary | ICD-10-CM

## 2016-07-26 LAB — CUP PACEART INCLINIC DEVICE CHECK: Date Time Interrogation Session: 20170817125742

## 2016-07-26 NOTE — Progress Notes (Signed)
Patient ID: Roy David, male   DOB: June 28, 1930, 82 y.o.   MRN: [id number]    Cardiology Office Note    Date:  07/27/2016   ID:  Roy David, DOB 1929/12/23, MRN [id number]  PCP:  Donnie Coffin, MD  Cardiologist:   Sanda Klein, MD   Chief Complaint  Patient presents with  . Follow-up    3 MONTHS    History of Present Illness:  Roy David is a 82 y.o. 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 Many episodes of atrial fibrillation seem to start during sinus tachycardia related to physical activity. He remains unaware of palpitations. He denies syncope, dizziness, lightheadedness, shortness of breath or angina pectoris.   Vice check today shows at least one clear-cut episode of atrial fibrillation, but also several recordings that represent sinus tachycardia with or without PACs. Sinus tachycardia events are always just barely above the upper recording rate.  He has easy bruising after starting on oral anticoagulants but has not had any serious bleeding complications. He has not had any new ophthalmological or neurological problems.  Additional problems include hypertension, hypercholesterolemia and moderate aortic valve stenosis. He reports that his blood pressure is often elevated when he comes to the doctor's office. It is only marginally high today  Past Medical History:  Diagnosis Date  . Arthritis   . Asthma   . Diverticulosis   . H/O seasonal allergies    Dr [name]  . Hypercholesteremia   . Hypertension   . Paroxysmal atrial fibrillation ([place]) 03/06/2016  . Prostate cancer [facility])    12/10- Dr Dahlstedt/Dr Valere Dross  . Thyroid disease    hypothyroidism    Past Surgical History:  Procedure Laterality Date  . EP IMPLANTABLE DEVICE N/A 11/29/2015   Procedure: Loop Recorder  Insertion;  Surgeon: Sanda Klein, MD;  Location: [facility];  Service: Cardiovascular;  Laterality: N/A;    Outpatient Medications Prior to Visit  Medication Sig Dispense Refill  . albuterol (PROVENTIL HFA;VENTOLIN HFA) 108 (90 BASE) MCG/ACT inhaler Inhale 1 puff into the lungs every 6 (six) hours as needed for wheezing or shortness of breath.    Marland Kitchen aspirin 81 MG tablet Take 81 mg by mouth daily.    Marland Kitchen doxycycline (VIBRAMYCIN) 100 MG capsule Take 100 mg by mouth as directed.  2  . ezetimibe-simvastatin (VYTORIN) 10-20 MG per tablet Take 1 tablet by mouth daily.    Marland Kitchen levothyroxine (SYNTHROID, LEVOTHROID) 112 MCG tablet Take 110 mcg by mouth daily.     Marland Kitchen lisinopril-hydrochlorothiazide (PRINZIDE,ZESTORETIC) 20-25 MG tablet Take 1 tablet by mouth daily.    . mometasone (ASMANEX 60 METERED DOSES) 220 MCG/INH inhaler Inhale 1 Inhaler into the lungs daily.    . montelukast (SINGULAIR) 10 MG tablet Take 1 tablet by mouth at bedtime.    . predniSONE (DELTASONE) 10 MG tablet Take 10 mg by mouth as directed.    . triamcinolone cream (KENALOG) 0.1 % Reported on 12/15/2015  2  . hydrOXYzine (ATARAX/VISTARIL) 10 MG tablet Reported on 12/15/2015  2  . apixaban (ELIQUIS) 5 MG TABS tablet Take 1 tablet (5 mg total) by mouth 2 (two) times daily. (Patient not taking: Reported on 07/26/2016) 60 tablet 11   No facility-administered medications prior to visit.      Allergies:   Bee venom   Social History  Social History  . Marital status: Married    Spouse name: N/A  . Number of children: N/A  . Years of education: N/A   Social History Main Topics  . Smoking status: Former Smoker    Quit date: 12/09/1959  . Smokeless tobacco: Never Used  . Alcohol use 3.6 oz/week    6 Shots of liquor per week  . Drug use: Unknown  . Sexual activity: Not Asked   Other Topics Concern  . None   Social History Narrative   Tobacco use cigarettes : Former smoker, Quit in year 1955   Occupation : retired/   married     Family History:  The patient's family history includes Heart attack in his mother; Stroke in his father and sister.   ROS:   Please see the history of present illness.    ROS All other systems reviewed and are negative.   PHYSICAL EXAM:   VS:  BP (!) 144/84   Pulse 71   Ht 5\' 8"  (1.727 m)   Wt 155 lb (70.3 kg)   BMI 23.57 kg/m    GEN: Well nourished, well developed, in no acute distress  HEENT: normal  Neck: no JVD, carotid bruits, or masses Cardiac: RRR; no murmurs, rubs, or gallops,no edema  Respiratory:  clear to auscultation bilaterally, normal work of breathing GI: soft, nontender, nondistended, + BS MS: no deformity or atrophy  Skin: warm and dry, no rash Neuro:  Alert and Oriented x 3, Strength and sensation are intact Psych: euthymic mood, full affect  Wt Readings from Last 3 Encounters:  07/26/16 155 lb (70.3 kg)  03/06/16 157 lb 9.6 oz (71.5 kg)  11/29/15 158 lb (71.7 kg)      Studies/Labs Reviewed:   EKG:  EKG is ordered today.  It shows sinus rhythm with a QTc interval of 441 ms   ASSESSMENT:    1. Paroxysmal atrial fibrillation (HCC)   2. Moderate aortic valve stenosis   3. Essential hypertension   4. Hyperlipidemia   5. Encounter for loop recorder check       PLAN:  In order of problems listed above:  1. PAFib: His risk of future embolic events as high. CHADSVasc 5 (age 38, "CVA"  2, HTN). Continue Eliquis 5 mg twice daily. The burden of atrial fibrillation is low. Even though he has very fast ventricular response, at this point I did not recommend starting any rate control medications or antiarrhythmics. 2. AS: Moderate, asymptomatic 3. HTN: Blood pressure is usually well within target range. If the prevalence of atrial fibrillation increases, consider switching from lisinopril-hydrochlorothiazide to a beta blocker. 4. HLP: Target LDL less than 100. On statin. 5. ILR: The recording range was increased to 154 beats per minutes and  the length of detection increase to 48 beats to avoid recording of episodes of sinus tachycardia   Medication Adjustments/Labs and Tests Ordered: Current medicines are reviewed at length with the patient today.  Concerns regarding medicines are outlined above.  Medication changes, Labs and Tests ordered today are listed in the Patient Instructions below. Patient Instructions  Dr Sallyanne Kuster recommends that you schedule a follow-up appointment in 12 months. You will receive a reminder letter in the mail two months in advance. If you don't receive a letter, please call our office to schedule the follow-up appointment.  If you need a refill on your cardiac medications before your next appointment, please call your pharmacy.      Signed, Sanda Klein, MD  07/27/2016 6:36 PM    [facility] [address], [place], [place]  [address] Phone: [phone]; Fax: [phone]

## 2016-07-26 NOTE — Patient Instructions (Signed)
Dr Croitoru recommends that you schedule a follow-up appointment in 12 months. You will receive a reminder letter in the mail two months in advance. If you don't receive a letter, please call our office to schedule the follow-up appointment.  If you need a refill on your cardiac medications before your next appointment, please call your pharmacy. 

## 2016-07-27 DIAGNOSIS — Z4509 Encounter for adjustment and management of other cardiac device: Secondary | ICD-10-CM | POA: Insufficient documentation

## 2016-07-27 NOTE — Progress Notes (Signed)
Carelink Summary Report / Loop Recorder 

## 2016-07-31 DIAGNOSIS — Z961 Presence of intraocular lens: Secondary | ICD-10-CM | POA: Diagnosis not present

## 2016-07-31 DIAGNOSIS — H35371 Puckering of macula, right eye: Secondary | ICD-10-CM | POA: Diagnosis not present

## 2016-07-31 DIAGNOSIS — H2511 Age-related nuclear cataract, right eye: Secondary | ICD-10-CM | POA: Diagnosis not present

## 2016-08-07 LAB — CUP PACEART INCLINIC DEVICE CHECK: MDC IDC SESS DTM: 20170817125742

## 2016-08-10 ENCOUNTER — Encounter: Payer: Self-pay | Admitting: Cardiovascular Disease

## 2016-08-21 LAB — CUP PACEART REMOTE DEVICE CHECK: Date Time Interrogation Session: 20170817223639

## 2016-08-27 ENCOUNTER — Ambulatory Visit (INDEPENDENT_AMBULATORY_CARE_PROVIDER_SITE_OTHER): Payer: Medicare Other | Admitting: *Deleted

## 2016-08-27 DIAGNOSIS — I639 Cerebral infarction, unspecified: Secondary | ICD-10-CM

## 2016-08-27 NOTE — Progress Notes (Signed)
Carelink Summary Report / Loop Recorder 

## 2016-09-05 DIAGNOSIS — M1712 Unilateral primary osteoarthritis, left knee: Secondary | ICD-10-CM | POA: Diagnosis not present

## 2016-09-05 DIAGNOSIS — M1711 Unilateral primary osteoarthritis, right knee: Secondary | ICD-10-CM | POA: Diagnosis not present

## 2016-09-13 DIAGNOSIS — Z23 Encounter for immunization: Secondary | ICD-10-CM | POA: Diagnosis not present

## 2016-09-16 LAB — CUP PACEART REMOTE DEVICE CHECK: Date Time Interrogation Session: 20170916223742

## 2016-09-16 NOTE — Progress Notes (Signed)
Carelink summary report received. Battery status OK. Normal device function. No new symptom episodes, tachy episodes, brady, or pause episodes. No new AF episodes. Monthly summary reports and ROV/PRN 

## 2016-09-24 ENCOUNTER — Ambulatory Visit (INDEPENDENT_AMBULATORY_CARE_PROVIDER_SITE_OTHER): Payer: Medicare Other | Admitting: *Deleted

## 2016-09-24 DIAGNOSIS — I639 Cerebral infarction, unspecified: Secondary | ICD-10-CM | POA: Diagnosis not present

## 2016-09-25 NOTE — Progress Notes (Signed)
Carelink Summary Report / Loop Recorder 

## 2016-10-04 DIAGNOSIS — I10 Essential (primary) hypertension: Secondary | ICD-10-CM | POA: Diagnosis not present

## 2016-10-04 DIAGNOSIS — E7800 Pure hypercholesterolemia, unspecified: Secondary | ICD-10-CM | POA: Diagnosis not present

## 2016-10-04 DIAGNOSIS — H9190 Unspecified hearing loss, unspecified ear: Secondary | ICD-10-CM | POA: Diagnosis not present

## 2016-10-04 DIAGNOSIS — M150 Primary generalized (osteo)arthritis: Secondary | ICD-10-CM | POA: Diagnosis not present

## 2016-10-04 DIAGNOSIS — I350 Nonrheumatic aortic (valve) stenosis: Secondary | ICD-10-CM | POA: Diagnosis not present

## 2016-10-04 DIAGNOSIS — J4520 Mild intermittent asthma, uncomplicated: Secondary | ICD-10-CM | POA: Diagnosis not present

## 2016-10-04 DIAGNOSIS — E039 Hypothyroidism, unspecified: Secondary | ICD-10-CM | POA: Diagnosis not present

## 2016-10-24 ENCOUNTER — Ambulatory Visit (INDEPENDENT_AMBULATORY_CARE_PROVIDER_SITE_OTHER): Payer: Medicare Other | Admitting: *Deleted

## 2016-10-24 DIAGNOSIS — I639 Cerebral infarction, unspecified: Secondary | ICD-10-CM

## 2016-10-25 NOTE — Progress Notes (Signed)
Carelink Summary Report / Loop Recorder 

## 2016-10-27 LAB — CUP PACEART REMOTE DEVICE CHECK
Date Time Interrogation Session: 20171017000603
Implantable Pulse Generator Implant Date: 20161220

## 2016-10-27 NOTE — Progress Notes (Signed)
Carelink summary report received. Battery status OK. Normal device function. No new symptom episodes, tachy episodes, brady, or pause episodes. 1 AF episode (appears SR with PAC's), burden 0%, +Eliquis.  Monthly summary reports and ROV/PRN

## 2016-11-06 DIAGNOSIS — H5340 Unspecified visual field defects: Secondary | ICD-10-CM | POA: Diagnosis not present

## 2016-11-23 ENCOUNTER — Ambulatory Visit (INDEPENDENT_AMBULATORY_CARE_PROVIDER_SITE_OTHER): Payer: Medicare Other | Admitting: *Deleted

## 2016-11-23 DIAGNOSIS — I639 Cerebral infarction, unspecified: Secondary | ICD-10-CM

## 2016-11-24 LAB — CUP PACEART REMOTE DEVICE CHECK
Date Time Interrogation Session: 20171216013855
MDC IDC PG IMPLANT DT: 20161220

## 2016-11-26 NOTE — Progress Notes (Signed)
Carelink Summary Report / Loop Recorder 

## 2016-12-06 LAB — CUP PACEART REMOTE DEVICE CHECK
Date Time Interrogation Session: 20171116010709
MDC IDC PG IMPLANT DT: 20161220

## 2016-12-24 ENCOUNTER — Ambulatory Visit (INDEPENDENT_AMBULATORY_CARE_PROVIDER_SITE_OTHER): Payer: Medicare Other | Admitting: *Deleted

## 2016-12-24 DIAGNOSIS — I639 Cerebral infarction, unspecified: Secondary | ICD-10-CM

## 2016-12-25 NOTE — Progress Notes (Signed)
Carelink Summary Report 

## 2016-12-28 DIAGNOSIS — R972 Elevated prostate specific antigen [PSA]: Secondary | ICD-10-CM | POA: Diagnosis not present

## 2017-01-04 DIAGNOSIS — N5201 Erectile dysfunction due to arterial insufficiency: Secondary | ICD-10-CM | POA: Diagnosis not present

## 2017-01-04 DIAGNOSIS — Z8546 Personal history of malignant neoplasm of prostate: Secondary | ICD-10-CM | POA: Diagnosis not present

## 2017-01-04 DIAGNOSIS — R351 Nocturia: Secondary | ICD-10-CM | POA: Diagnosis not present

## 2017-01-13 NOTE — Progress Notes (Signed)
Carelink summary report received. Battery status OK. Normal device function. No new symptom episodes, tachy episodes, brady, or pause episodes. 1 AF 0% +Eliquis. Monthly summary reports and ROV/PRN

## 2017-01-22 ENCOUNTER — Ambulatory Visit (INDEPENDENT_AMBULATORY_CARE_PROVIDER_SITE_OTHER): Payer: Medicare Other | Admitting: *Deleted

## 2017-01-22 DIAGNOSIS — I639 Cerebral infarction, unspecified: Secondary | ICD-10-CM | POA: Diagnosis not present

## 2017-01-23 NOTE — Progress Notes (Signed)
Carelink Summary Report / Loop Recorder 

## 2017-02-02 LAB — CUP PACEART REMOTE DEVICE CHECK
Date Time Interrogation Session: 20180115023811
Implantable Pulse Generator Implant Date: 20161220

## 2017-02-02 NOTE — Progress Notes (Signed)
Carelink summary report received. Battery status OK. Normal device function. No new symptom episodes, tachy episodes, brady, or pause episodes. 8 AF 0% +Eliquis. Monthly summary reports and ROV/PRN

## 2017-02-18 LAB — CUP PACEART REMOTE DEVICE CHECK
Implantable Pulse Generator Implant Date: 20161220
MDC IDC SESS DTM: 20180214023810

## 2017-02-21 ENCOUNTER — Ambulatory Visit (INDEPENDENT_AMBULATORY_CARE_PROVIDER_SITE_OTHER): Payer: Medicare Other | Admitting: *Deleted

## 2017-02-21 DIAGNOSIS — I639 Cerebral infarction, unspecified: Secondary | ICD-10-CM

## 2017-02-22 NOTE — Progress Notes (Signed)
Carelink Summary Report / Loop Recorder 

## 2017-03-02 LAB — CUP PACEART REMOTE DEVICE CHECK
Implantable Pulse Generator Implant Date: 20161220
MDC IDC SESS DTM: 20180316034053

## 2017-03-02 NOTE — Progress Notes (Signed)
Carelink summary report received. Battery status OK. Normal device function. No new symptom episodes, tachy episodes, brady, or pause episodes. 13 AF 0.1% 3 w/ ECG appear SR w/ PACS +Eliquis for known PAF. Monthly summary reports and ROV/PRN

## 2017-03-25 ENCOUNTER — Ambulatory Visit (INDEPENDENT_AMBULATORY_CARE_PROVIDER_SITE_OTHER): Payer: Medicare Other | Admitting: *Deleted

## 2017-03-25 DIAGNOSIS — I639 Cerebral infarction, unspecified: Secondary | ICD-10-CM

## 2017-03-25 NOTE — Progress Notes (Signed)
Carelink Summary Report / Loop Recorder 

## 2017-04-04 DIAGNOSIS — I10 Essential (primary) hypertension: Secondary | ICD-10-CM | POA: Diagnosis not present

## 2017-04-04 DIAGNOSIS — E039 Hypothyroidism, unspecified: Secondary | ICD-10-CM | POA: Diagnosis not present

## 2017-04-04 DIAGNOSIS — M150 Primary generalized (osteo)arthritis: Secondary | ICD-10-CM | POA: Diagnosis not present

## 2017-04-04 DIAGNOSIS — E7800 Pure hypercholesterolemia, unspecified: Secondary | ICD-10-CM | POA: Diagnosis not present

## 2017-04-06 LAB — CUP PACEART REMOTE DEVICE CHECK
Date Time Interrogation Session: 20180415034025
MDC IDC PG IMPLANT DT: 20161220

## 2017-04-06 NOTE — Progress Notes (Signed)
Carelink summary report received. Battery status OK. Normal device function. No new symptom episodes, tachy episodes, brady, or pause episodes. No new AF episodes. Monthly summary reports and ROV/PRN 

## 2017-04-22 ENCOUNTER — Ambulatory Visit (INDEPENDENT_AMBULATORY_CARE_PROVIDER_SITE_OTHER): Payer: Medicare Other | Admitting: *Deleted

## 2017-04-22 DIAGNOSIS — I639 Cerebral infarction, unspecified: Secondary | ICD-10-CM | POA: Diagnosis not present

## 2017-04-23 NOTE — Progress Notes (Signed)
Carelink Summary Report / Loop Recorder 

## 2017-04-26 LAB — CUP PACEART REMOTE DEVICE CHECK
Implantable Pulse Generator Implant Date: 20161220
MDC IDC SESS DTM: 20180515033849

## 2017-05-14 DIAGNOSIS — E871 Hypo-osmolality and hyponatremia: Secondary | ICD-10-CM | POA: Diagnosis not present

## 2017-05-22 ENCOUNTER — Ambulatory Visit (INDEPENDENT_AMBULATORY_CARE_PROVIDER_SITE_OTHER): Payer: Medicare Other | Admitting: *Deleted

## 2017-05-22 DIAGNOSIS — I639 Cerebral infarction, unspecified: Secondary | ICD-10-CM | POA: Diagnosis not present

## 2017-05-23 NOTE — Progress Notes (Signed)
Carelink Summary Report / Loop Recorder 

## 2017-06-02 LAB — CUP PACEART REMOTE DEVICE CHECK
Date Time Interrogation Session: 20180614033949
Implantable Pulse Generator Implant Date: 20161220

## 2017-06-02 NOTE — Progress Notes (Signed)
Carelink summary report received. Battery status OK. Normal device function. No new symptom episodes, tachy episodes, brady, or pause episodes. 2 AF 0% ECGs appear SR w/ PACS. +Eliquis. Monthly summary reports and ROV/PRN

## 2017-06-03 DIAGNOSIS — B029 Zoster without complications: Secondary | ICD-10-CM | POA: Diagnosis not present

## 2017-06-19 ENCOUNTER — Telehealth: Payer: Self-pay | Admitting: Cardiovascular Disease

## 2017-06-19 MED ORDER — RIVAROXABAN 15 MG PO TABS: 15.0000 mg | ORAL_TABLET | Freq: Every day | ORAL | 0 refills | Status: DC

## 2017-06-19 NOTE — Telephone Encounter (Signed)
Per Raquel, Cr clearance - 49, recommended Xarelto 15 mg QD. Rx(s) sent to patient's preferred pharmacy electronically.  Patient will pick up trial card and have prescription filled tomorrow.  Patient inquired whether he still needed to continue Aspirin with the new medication. Please advise?

## 2017-06-19 NOTE — Telephone Encounter (Signed)
Returned call to patient. Patient is agreeable to starting Xarelto. Trial card placed at front desk for patient to pick-up. Patient requested 30-day supply be sent to the [facility].   Discussed history (see below)and recent labs from 05/14/17 (found through [facility] with Raquel, PharmD, for Xarelto dosing.  History from last remote device check: Notes recorded by Sanda Klein, MD on 06/14/2017 at 1:21 PM EDT Is he willing to try an alternative medicine such as Xarelto? If yes, please give 30 day free card before we call in Rx. If not, please have him stay on ASA, but explain that it is a much less effective treatment for stroke prevention (only minimally better than nothing). ------  Notes recorded by Diana Eves, CMA on 06/14/2017 at 10:54 AM EDT Called patient, spoke with wife, [name] (ok per DPR).  Wife states he is not taking Eliquis. "He only took it a few times, he didn't like the way it made him feel."  Please advise.  62 - Wife reports that Mr Choyce has had the shingles x2-3 months. Dermatologist started him on Gabapentin 300 mg - pt only takes 1 daily. ------  Notes recorded by Sanda Klein, MD on 06/13/2017 at 4:38 PM EDT Normal implantable loop recorder download. Two new brief episodes of rapid atrial rhythm, one is clearly atrial fibrillation, the other probably sinus tachycardia with PACs. Normal battery status.  Chelley, I see he is on both aspirin and Eliquis. He should stop the aspirin, please.

## 2017-06-19 NOTE — Telephone Encounter (Signed)
°  New Message   pt verbalized that he is returning call for rn   He said its about a medication for a 30 day trial

## 2017-06-19 NOTE — Telephone Encounter (Signed)
Returned call to patient, states he has been speaking to Dr. Sallyanne Kuster nurse in regards to medication change.  Will forward to nurse.

## 2017-06-21 ENCOUNTER — Ambulatory Visit (INDEPENDENT_AMBULATORY_CARE_PROVIDER_SITE_OTHER): Payer: Medicare Other | Admitting: *Deleted

## 2017-06-21 DIAGNOSIS — H5213 Myopia, bilateral: Secondary | ICD-10-CM | POA: Diagnosis not present

## 2017-06-21 DIAGNOSIS — I639 Cerebral infarction, unspecified: Secondary | ICD-10-CM | POA: Diagnosis not present

## 2017-06-24 NOTE — Progress Notes (Signed)
Carelink Summary Report / Loop Recorder 

## 2017-06-27 LAB — CUP PACEART REMOTE DEVICE CHECK
Date Time Interrogation Session: 20180714085202
MDC IDC PG IMPLANT DT: 20161220

## 2017-07-04 NOTE — Telephone Encounter (Signed)
Patient aware of recommendations. Verbalized understanding and agreed with plan.

## 2017-07-04 NOTE — Telephone Encounter (Signed)
Discussed with MCr today, 07/04/17. Okay to stop ASA. No need for anti-platelet therapy.   lmtcb

## 2017-07-04 NOTE — Telephone Encounter (Signed)
Follow up ° ° °Pt is returning call to nurse.  °

## 2017-07-18 ENCOUNTER — Other Ambulatory Visit: Payer: Self-pay | Admitting: Cardiovascular Disease

## 2017-07-18 NOTE — Telephone Encounter (Signed)
Request received for Xarelto 15mg ; pt is 83 yrs old, Wt-70.3kg, last seen by Dr. Loleta Chance on 07/26/16 & upcoming appt in September, last Crea-1.11 via Dr. [name] at [facility] on 05/14/2017, CrCl-48.1ml/min. Will send in refill request to requested Pharmacy.

## 2017-07-22 ENCOUNTER — Ambulatory Visit (INDEPENDENT_AMBULATORY_CARE_PROVIDER_SITE_OTHER): Payer: Medicare Other | Admitting: *Deleted

## 2017-07-22 DIAGNOSIS — I480 Paroxysmal atrial fibrillation: Secondary | ICD-10-CM | POA: Diagnosis not present

## 2017-07-27 LAB — CUP PACEART REMOTE DEVICE CHECK
Date Time Interrogation Session: 20180813141518
MDC IDC PG IMPLANT DT: 20161220

## 2017-07-27 NOTE — Progress Notes (Signed)
Carelink summary report received. Battery status OK. Normal device function. No new symptom episodes, tachy episodes, brady, or pause episodes. No new AF episodes. Monthly summary reports and ROV/PRN 

## 2017-08-05 NOTE — Progress Notes (Signed)
Loop Recorder Summary Report 

## 2017-08-15 ENCOUNTER — Other Ambulatory Visit: Payer: Self-pay | Admitting: Cardiovascular Disease

## 2017-08-21 ENCOUNTER — Ambulatory Visit (INDEPENDENT_AMBULATORY_CARE_PROVIDER_SITE_OTHER): Payer: Medicare Other | Admitting: *Deleted

## 2017-08-21 DIAGNOSIS — I480 Paroxysmal atrial fibrillation: Secondary | ICD-10-CM | POA: Diagnosis not present

## 2017-08-21 NOTE — Progress Notes (Signed)
Carelink Summary Report / Loop Recorder 

## 2017-08-26 LAB — CUP PACEART REMOTE DEVICE CHECK
Date Time Interrogation Session: 20180912143910
MDC IDC PG IMPLANT DT: 20161220

## 2017-08-29 DIAGNOSIS — M1711 Unilateral primary osteoarthritis, right knee: Secondary | ICD-10-CM | POA: Diagnosis not present

## 2017-08-29 DIAGNOSIS — M1712 Unilateral primary osteoarthritis, left knee: Secondary | ICD-10-CM | POA: Diagnosis not present

## 2017-09-03 ENCOUNTER — Ambulatory Visit (INDEPENDENT_AMBULATORY_CARE_PROVIDER_SITE_OTHER): Payer: Medicare Other | Admitting: Cardiovascular Disease

## 2017-09-03 ENCOUNTER — Encounter: Payer: Self-pay | Admitting: Cardiovascular Disease

## 2017-09-03 VITALS — BP 130/72 | HR 69 | Ht 68.0 in | Wt 162.0 lb

## 2017-09-03 DIAGNOSIS — I10 Essential (primary) hypertension: Secondary | ICD-10-CM

## 2017-09-03 DIAGNOSIS — Z4509 Encounter for adjustment and management of other cardiac device: Secondary | ICD-10-CM | POA: Diagnosis not present

## 2017-09-03 DIAGNOSIS — Z7901 Long term (current) use of anticoagulants: Secondary | ICD-10-CM | POA: Diagnosis not present

## 2017-09-03 DIAGNOSIS — I480 Paroxysmal atrial fibrillation: Secondary | ICD-10-CM

## 2017-09-03 DIAGNOSIS — E7800 Pure hypercholesterolemia, unspecified: Secondary | ICD-10-CM | POA: Diagnosis not present

## 2017-09-03 DIAGNOSIS — I350 Nonrheumatic aortic (valve) stenosis: Secondary | ICD-10-CM

## 2017-09-03 NOTE — Patient Instructions (Signed)
Dr Croitoru recommends that you schedule a follow-up appointment in 12 months. You will receive a reminder letter in the mail two months in advance. If you don't receive a letter, please call our office to schedule the follow-up appointment.  If you need a refill on your cardiac medications before your next appointment, please call your pharmacy. 

## 2017-09-03 NOTE — Progress Notes (Signed)
Patient ID: Roy David, male   DOB: August 05, 1930, 83 y.o.   MRN: [id number]    Cardiology Office Note    Date:  09/04/2017   ID:  Roy David, DOB [birth year], MRN [id number]  PCP:  [name], L.Marlou Sa, MD  Cardiologist:   Sanda Klein, MD   Chief Complaint  Patient presents with  . Follow-up    DOE thinks its related to the Xarelto    History of Present Illness:  Roy David is a 83 y.o. 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 Many episodes of atrial fibrillation seem to start during sinus tachycardia related to physical activity.   He feels well. He remains very active physically.The patient specifically denies any chest pain at rest exertion, dyspnea at rest or with exertion, orthopnea, paroxysmal nocturnal dyspnea, syncope, palpitations, focal neurological deficits, intermittent claudication, lower extremity edema, unexplained weight gain, cough, hemoptysis or wheezing. He complains of mild transient shortness of breath after he takes his Xarelto.  He has easy bruising of the forearms when he works in his garden, but otherwise has no bleeding complaints.. He has not had falls or serious injuries or bleeding problems.  Additional problems include hypertension, hypercholesterolemia and moderate aortic valve stenosis.   Past Medical History:  Diagnosis Date  . Arthritis   . Asthma   . Diverticulosis   . H/O seasonal allergies    Dr [name]  . Hypercholesteremia   . Hypertension   . Paroxysmal atrial fibrillation ([place]) 03/06/2016  . Prostate cancer [facility])    12/10- Dr Dahlstedt/Dr Valere Dross  . Thyroid disease    hypothyroidism    Past Surgical History:  Procedure Laterality Date  . EP IMPLANTABLE DEVICE N/A 11/29/2015   Procedure: Loop Recorder Insertion;  Surgeon: Sanda Klein, MD;  Location: [facility];  Service: Cardiovascular;  Laterality: N/A;    Outpatient Medications Prior to Visit  Medication Sig Dispense Refill  . ezetimibe-simvastatin (VYTORIN) 10-20 MG per tablet Take 1 tablet by mouth daily.    Marland Kitchen levothyroxine (SYNTHROID, LEVOTHROID) 112 MCG tablet Take 110 mcg by mouth daily.     Marland Kitchen lisinopril-hydrochlorothiazide (PRINZIDE,ZESTORETIC) 20-25 MG tablet Take 1 tablet by mouth daily.    . mometasone (ASMANEX 60 METERED DOSES) 220 MCG/INH inhaler Inhale 1 Inhaler into the lungs daily.    Alveda Reasons 15 MG TABS tablet TAKE 1 TABLET(15 MG) BY MOUTH DAILY WITH SUPPER 90 tablet 1  . albuterol (PROVENTIL HFA;VENTOLIN HFA) 108 (90 BASE) MCG/ACT inhaler Inhale 1 puff into the lungs every 6 (six) hours as needed for wheezing or shortness of breath.    Marland Kitchen aspirin 81 MG tablet Take 81 mg by mouth daily.    Marland Kitchen doxycycline (VIBRAMYCIN) 100 MG capsule Take 100 mg by mouth as directed.  2  . montelukast (SINGULAIR) 10 MG tablet Take 1 tablet by mouth at bedtime.    . predniSONE (DELTASONE) 10 MG tablet Take 10 mg by mouth as directed.    . triamcinolone cream (KENALOG) 0.1 % Reported on 12/15/2015  2   No facility-administered medications prior to visit.      Allergies:   Bee venom   Social History   Social History  . Marital status: Married    Spouse name: N/A  . Number of children: N/A  . Years of education: N/A   Social History Main  Topics  . Smoking status: Former Smoker    Quit date: 12/09/1959  . Smokeless tobacco: Never Used  . Alcohol use 3.6 oz/week    6 Shots of liquor per week  . Drug use: Unknown  . Sexual activity: Not Asked   Other Topics Concern  . None   Social History Narrative   Tobacco use cigarettes : Former smoker, Quit in year 1955   Occupation : retired/  married     Family History:  The patient's family history includes Heart attack in his mother; Stroke in his father and sister.   ROS:   Please see the history  of present illness.    ROS All other systems reviewed and are negative.   PHYSICAL EXAM:   VS:  BP 130/72   Pulse 69   Ht 5\' 8"  (1.727 m)   Wt 162 lb (73.5 kg)   BMI 24.63 kg/m     General: Alert, oriented x3, no distress, appears lean and fit Head: no evidence of trauma, PERRL, EOMI, no exophtalmos or lid lag, no myxedema, no xanthelasma; normal ears, nose and oropharynx Neck: normal jugular venous pulsations and no hepatojugular reflux; brisk carotid pulses without delay and bilateral carotid bruits, probably radiating from the chest Chest: clear to auscultation, no signs of consolidation by percussion or palpation, normal fremitus, symmetrical and full respiratory excursions Cardiovascular: normal position and quality of the apical impulse, regular rhythm, normal first and second heart sounds, 3/6 early to mid peaking systolic ejection murmur in the aortic focus radiating to the carotids no diastolic murmurs, rubs or gallops Abdomen: no tenderness or distention, no masses by palpation, no abnormal pulsatility or arterial bruits, normal bowel sounds, no hepatosplenomegaly Extremities: no clubbing, cyanosis or edema; 2+ radial, ulnar and brachial pulses bilaterally; 2+ right femoral, posterior tibial and dorsalis pedis pulses; 2+ left femoral, posterior tibial and dorsalis pedis pulses; no subclavian or femoral bruits Neurological: grossly nonfocal Psych: Normal mood and affect   Wt Readings from Last 3 Encounters:  09/03/17 162 lb (73.5 kg)  07/26/16 155 lb (70.3 kg)  03/06/16 157 lb 9.6 oz (71.5 kg)      Studies/Labs Reviewed:   EKG:  EKG is ordered today.  It shows normal sinus rhythm, normal tracing, QTC 432 ms  LABS creatinine 1.1, hemoglobin 14.1, potassium 4.4, normal LFTs, TSH 2.96 Total cholesterol 174, HDL 77, LDL 79, triglycerides 94    ASSESSMENT:    1. Paroxysmal atrial fibrillation (HCC)   2. Long term current use of anticoagulant   3. Moderate aortic valve  stenosis   4. Essential hypertension   5. Pure hypercholesterolemia   6. Encounter for loop recorder check       PLAN:  In order of problems listed above:  1. PAFib: The burden of arrhythmia is low, but his risk of future embolic events is high. CHADSVasc 5 (age 89, "CVA"  2, HTN). Continue Xarelto, dose adjusted for renal function. Antiarrhythmics and even rate control medications do not appear to be justified at this time.  2. Xarelto: Reassured him that this is unlikely to be a cause of any complaints other than bleeding. 3. AS: Moderate, asymptomatic 4. HTN: Well-controlled. If atrial arrhythmia becomes asymptomatic for more prevalent problem, with rapid ventricular rate, add beta blocker instead of ACE inhibitor  5. HLP: On statin, LDL at target. 6. ILR: Normal device function  Medication Adjustments/Labs and Tests Ordered: Current medicines are reviewed at length with the patient today.  Concerns regarding medicines  are outlined above.  Medication changes, Labs and Tests ordered today are listed in the Patient Instructions below. Patient Instructions  Dr Sallyanne Kuster recommends that you schedule a follow-up appointment in 12 months. You will receive a reminder letter in the mail two months in advance. If you don't receive a letter, please call our office to schedule the follow-up appointment.  If you need a refill on your cardiac medications before your next appointment, please call your pharmacy.      Signed, Sanda Klein, MD  09/04/2017 7:54 PM    [address], [place], [place]  [address] Phone: [phone]; Fax: [phone]

## 2017-09-04 DIAGNOSIS — Z7901 Long term (current) use of anticoagulants: Secondary | ICD-10-CM | POA: Insufficient documentation

## 2017-09-20 ENCOUNTER — Ambulatory Visit (INDEPENDENT_AMBULATORY_CARE_PROVIDER_SITE_OTHER): Payer: Medicare Other | Admitting: *Deleted

## 2017-09-20 DIAGNOSIS — I639 Cerebral infarction, unspecified: Secondary | ICD-10-CM | POA: Diagnosis not present

## 2017-09-20 NOTE — Progress Notes (Signed)
Carelink Summary Report / Loop Recorder 

## 2017-09-24 LAB — CUP PACEART REMOTE DEVICE CHECK
Date Time Interrogation Session: 20181012143747
MDC IDC PG IMPLANT DT: 20161220

## 2017-09-30 DIAGNOSIS — E7800 Pure hypercholesterolemia, unspecified: Secondary | ICD-10-CM | POA: Diagnosis not present

## 2017-09-30 DIAGNOSIS — M150 Primary generalized (osteo)arthritis: Secondary | ICD-10-CM | POA: Diagnosis not present

## 2017-09-30 DIAGNOSIS — I10 Essential (primary) hypertension: Secondary | ICD-10-CM | POA: Diagnosis not present

## 2017-09-30 DIAGNOSIS — E039 Hypothyroidism, unspecified: Secondary | ICD-10-CM | POA: Diagnosis not present

## 2017-10-21 ENCOUNTER — Ambulatory Visit (INDEPENDENT_AMBULATORY_CARE_PROVIDER_SITE_OTHER): Payer: Medicare Other | Admitting: *Deleted

## 2017-10-21 DIAGNOSIS — I639 Cerebral infarction, unspecified: Secondary | ICD-10-CM | POA: Diagnosis not present

## 2017-10-21 NOTE — Progress Notes (Signed)
Carelink Summary Report / Loop Recorder 

## 2017-10-28 LAB — CUP PACEART REMOTE DEVICE CHECK
MDC IDC PG IMPLANT DT: 20161220
MDC IDC SESS DTM: 20181111150920

## 2017-11-19 ENCOUNTER — Ambulatory Visit (INDEPENDENT_AMBULATORY_CARE_PROVIDER_SITE_OTHER): Payer: Medicare Other | Admitting: *Deleted

## 2017-11-19 DIAGNOSIS — I639 Cerebral infarction, unspecified: Secondary | ICD-10-CM | POA: Diagnosis not present

## 2017-11-19 NOTE — Progress Notes (Signed)
Carelink Summary Report / Loop Recorder 

## 2017-11-27 LAB — CUP PACEART REMOTE DEVICE CHECK
MDC IDC PG IMPLANT DT: 20161220
MDC IDC SESS DTM: 20181211151029

## 2017-12-19 ENCOUNTER — Ambulatory Visit (INDEPENDENT_AMBULATORY_CARE_PROVIDER_SITE_OTHER): Payer: Medicare Other | Admitting: *Deleted

## 2017-12-19 DIAGNOSIS — I639 Cerebral infarction, unspecified: Secondary | ICD-10-CM

## 2017-12-19 NOTE — Progress Notes (Signed)
Carelink Summary Report / Loop Recorder 

## 2017-12-31 LAB — CUP PACEART REMOTE DEVICE CHECK
Implantable Pulse Generator Implant Date: 20161220
MDC IDC SESS DTM: 20190110191923

## 2018-01-09 ENCOUNTER — Telehealth: Payer: Self-pay | Admitting: Cardiovascular Disease

## 2018-01-09 NOTE — Telephone Encounter (Signed)
Please make sure we check his H/H when he comes in [facility]

## 2018-01-09 NOTE — Telephone Encounter (Signed)
Spoke with pt, aware we will check blood work while he is here Monday.

## 2018-01-09 NOTE — Telephone Encounter (Signed)
Spoke with pt, he has noticed SOB with exertion like taking a shower, walking out to the building or raking in the yard. He feels this has been going on since he started the xarelto. He does not have swelling but in the evening he will have a sock ring on his legs. He denies orthopnea. He does notice his heart rate will also elevate with the SOB but goes down quickly at rest. His bp is running 122/48. He also feels his cholesterol being so low may have something to do with how he feels as well. He is alright with the appointment Monday and will forward to dr croitoru for his recommendations prior to appt.

## 2018-01-09 NOTE — Telephone Encounter (Signed)
Pt says he gets short of breath when he tries to do anything and has absolutely no energy when he tries to do anything.As long as he is sitting or walking around,he has no problem.I scheduled pt an appointment with Rosaria Ferries on Monday.Please call to evaluate.

## 2018-01-13 ENCOUNTER — Encounter: Payer: Self-pay | Admitting: Physician Assistant

## 2018-01-13 ENCOUNTER — Ambulatory Visit: Payer: Medicare Other | Admitting: Physician Assistant

## 2018-01-13 VITALS — BP 132/70 | HR 87 | Ht 68.0 in | Wt 166.0 lb

## 2018-01-13 DIAGNOSIS — I10 Essential (primary) hypertension: Secondary | ICD-10-CM | POA: Diagnosis not present

## 2018-01-13 DIAGNOSIS — R0609 Other forms of dyspnea: Secondary | ICD-10-CM | POA: Diagnosis not present

## 2018-01-13 DIAGNOSIS — I480 Paroxysmal atrial fibrillation: Secondary | ICD-10-CM

## 2018-01-13 DIAGNOSIS — Z7901 Long term (current) use of anticoagulants: Secondary | ICD-10-CM | POA: Diagnosis not present

## 2018-01-13 DIAGNOSIS — I350 Nonrheumatic aortic (valve) stenosis: Secondary | ICD-10-CM | POA: Diagnosis not present

## 2018-01-13 DIAGNOSIS — R0600 Dyspnea, unspecified: Secondary | ICD-10-CM

## 2018-01-13 DIAGNOSIS — E7800 Pure hypercholesterolemia, unspecified: Secondary | ICD-10-CM

## 2018-01-13 NOTE — Progress Notes (Signed)
Thanks. I had hoped we would have the H/H before you saw him MCr

## 2018-01-13 NOTE — Patient Instructions (Addendum)
Medication Instructions:   Your physician recommends that you continue on your current medications as directed. Please refer to the Current Medication list given to you today.   If you need a refill on your cardiac medications before your next appointment, please call your pharmacy.  Labwork:CBC  AND BMET TODAY    Testing/Procedures:  Your physician has requested that you have an echocardiogram. Echocardiography is a painless test that uses sound waves to create images of your heart. It provides your doctor with information about the size and shape of your heart and how well your heart's chambers and valves are working. This procedure takes approximately one hour. There are no restrictions for this procedure.      Follow-Up:  Your physician wants you to follow-up in:  [place] Roy will receive a reminder letter in the mail two months in advance. If you don't receive a letter, please call our office to schedule the follow-up appointment.  \    Any Other Special Instructions Will Be Listed Below (If Applicable).

## 2018-01-13 NOTE — Progress Notes (Addendum)
Cardiology Office Note   Date:  01/13/2018   ID:  Roy David, DOB Apr 16, 1930, MRN [id number]  PCP:  [name], L.Marlou Sa, MD  Cardiologist:  Dr Sallyanne Kuster, 09/03/2017  Daune Perch, NP   Chief Complaint  Patient presents with  . f/u visit    pt c/o SOB on exertion; occasional mild lightheadedness; and mild swelling in legs/feet/ ankles    History of Present Illness: Roy David is a 84 y.o. male with a history of central retinal artery occlusion>>ILR>>PAF, HTN, HLD, Mod AS  Phone notes 01/31 regarding increasing DOE, Dr C rec H&H, appt made.  Roy David presents for follow up today with his wife. He is noting increased DOE since he started taking Xarelto. His wife states that he is constantly busy, working in the yard and doing errands. He states that he has to take breaks more often and sooner than he is used to. He denies chest discomfort, lightheadedness, syncope/nearsyncope, orthopnea, PND, edema. He has had no unusual bleeding and no melena. He does note a hx of diverticulosis.   He was previously on Eliquis which was switched to Xarelto due to significant weakness and lethargy. He has not had this with Xarelto.   The patient does not think he has any orthostatic symptoms. His BP is 132/70 today, he has not taken his meds yet this am.    Past Medical History:  Diagnosis Date  . Arthritis   . Asthma   . Diverticulosis   . H/O seasonal allergies    Dr [name]  . Hypercholesteremia   . Hypertension   . Paroxysmal atrial fibrillation ([place]) 03/06/2016  . Prostate cancer [facility])    12/10- Dr Dahlstedt/Dr Valere Dross  . Thyroid disease    hypothyroidism    Past Surgical History:  Procedure Laterality Date  . EP IMPLANTABLE DEVICE N/A 11/29/2015   Procedure: Loop Recorder Insertion;  Surgeon: Sanda Klein, MD;  Location: [facility];  Service: Cardiovascular;  Laterality: N/A;    Current Outpatient Medications  Medication Sig Dispense Refill  . EPINEPHrine 0.3  mg/0.3 mL IJ SOAJ injection Inject 0.3 mg into the muscle once.    . ezetimibe-simvastatin (VYTORIN) 10-20 MG per tablet Take 1 tablet by mouth daily.    Marland Kitchen gabapentin (NEURONTIN) 300 MG capsule Take 300 mg by mouth 3 (three) times daily.    . Glucosamine HCl ([employer] GLUCOSAMINE) 1500 MG TABS Take 2 tablets by mouth daily.    Marland Kitchen levothyroxine (SYNTHROID, LEVOTHROID) 112 MCG tablet Take 110 mcg by mouth daily.     Marland Kitchen lisinopril-hydrochlorothiazide (PRINZIDE,ZESTORETIC) 20-25 MG tablet Take 1 tablet by mouth daily.    . mometasone (ASMANEX 60 METERED DOSES) 220 MCG/INH inhaler Inhale 1 Inhaler into the lungs daily.    Alveda Reasons 15 MG TABS tablet TAKE 1 TABLET(15 MG) BY MOUTH DAILY WITH SUPPER 90 tablet 1   No current facility-administered medications for this visit.     Allergies:   Bee venom    Social History:  The patient  reports that he quit smoking about 58 years ago. he has never used smokeless tobacco. He reports that he drinks about 3.6 oz of alcohol per week.   Family History:  The patient's family history includes Heart attack in his mother; Stroke in his father and sister.    ROS:  Please see the history of present illness. All other systems are reviewed and negative.    PHYSICAL EXAM: VS:  BP 132/70 (BP Location: Right Arm, Patient Position: Sitting,  Cuff Size: Normal)   Pulse 87   Ht 5\' 8"  (1.727 m)   Wt 166 lb (75.3 kg)   SpO2 99%   BMI 25.24 kg/m  , BMI Body mass index is 25.24 kg/m. GEN: Well nourished, well developed, male in no acute distress  HEENT: normal for age  Neck: no JVD, referred cardiac murmur audible in carotids, no masses Cardiac: RRR; 3/6 systolic murmur, no rubs, or gallops Respiratory:  clear to auscultation bilaterally, normal work of breathing GI: soft, nontender, nondistended, + BS MS: no deformity or atrophy; no edema; distal pulses are 2+ in all 4 extremities   Skin: warm and dry, no rash Neuro:  Strength and sensation are intact Psych: euthymic  mood, full affect  Orthostatic VS for the past 24 hrs:  BP- Lying BP- Sitting BP- Standing at 0 minutes  01/13/18 1205 140/64 130/68 140/72       EKG:  EKG is not ordered today.   Recent Labs: No results found for requested labs within last 8760 hours.    Lipid Panel No results found for: CHOL, TRIG, HDL, CHOLHDL, VLDL, LDLCALC, LDLDIRECT   Wt Readings from Last 3 Encounters:  01/13/18 166 lb (75.3 kg)  09/03/17 162 lb (73.5 kg)  07/26/16 155 lb (70.3 kg)     Other studies Reviewed: Additional studies/ records that were reviewed today include:  Echocardiogram 10/01/2014 Study Conclusions  - Left ventricle: The cavity size was normal. There was mild focal basal hypertrophy of the septum. Systolic function was normal. The estimated ejection fraction was in the range of 55% to 60%. Wall motion was normal; there were no regional wall motion abnormalities. Doppler parameters are consistent with abnormal left ventricular relaxation (grade 1 diastolic dysfunction). Doppler parameters are consistent with high ventricular filling pressure. - Aortic valve: Cusp separation was reduced. There was moderate stenosis. There was mild regurgitation. Mean gradient (S): 20 mm Hg. Valve area (Vmax): 1.17 cm^2. - Mitral valve: Moderately calcified annulus. - Left atrium: The atrium was mildly dilated. - Right ventricle: The cavity size was mildly dilated. Wall thickness was normal.  ASSESSMENT AND PLAN:  DOE: Mild DOE with working in the yard and busier activities. No chest discomfort. Will check CBC to evaluate for anemia and echo to evaluate aortic valve. Pt not orthostatic.   PAF: No evidence of afib on loop recorder. No palpitations. CHA2DS2/VAS Stroke Risk Score 5 (HTN, Age (4), HTN, stroke). Pt on Xarelto 15 mg (renal function) for stroke risk reduction. Pt did not tolerate Eliquis due to weakness and fatigue. Currently has mild DOE that he says started with  Xarelto. Denies unusual bleeding or melena. Will check CBC. He is not complaining of symptoms severe enough to stop Xarelto as he did on Eliquis. Will check BMP for renal function.   Hypertension: Pt reports good BP control at home. BP today 132/70. Has not taken his meds yet this am. Not orthostatic today. No lightheadedness upon standing.   Hyperlipidemia: Followed by PCP. Pt reports taht in 09/2017 LDL was 47, HDL 84. Continue current Vytorin.   Moderate aortic stenosis: Moderate by echo in 09/2014. Will recheck echo. No S/S of heart failure, except mild DOE.   Current medicines are reviewed at length with the patient today.  The patient does not have concerns regarding medicines.  The following changes have been made:  no change  Labs/ tests ordered today include: CBC, BMP  Orders Placed This Encounter  Procedures  . CBC  . Basic Metabolic  Panel (BMET)  . ECHOCARDIOGRAM COMPLETE    Disposition:   FU with Dr Sallyanne Kuster  Signed, Daune Perch, NP  01/13/2018 12:06 PM    [employer] Phone: [phone]; Fax: [phone]  This note was written with the assistance of speech recognition software. Please excuse any transcriptional errors.

## 2018-01-14 ENCOUNTER — Telehealth: Payer: Self-pay | Admitting: Physician Assistant

## 2018-01-14 LAB — BASIC METABOLIC PANEL
BUN/Creatinine Ratio: 16 (ref 10–24)
BUN: 25 mg/dL (ref 8–27)
CALCIUM: 9.3 mg/dL (ref 8.6–10.2)
CO2: 20 mmol/L (ref 20–29)
Chloride: 101 mmol/L (ref 96–106)
Creatinine, Ser: 1.61 mg/dL — ABNORMAL HIGH (ref 0.76–1.27)
GFR calc Af Amer: 44 mL/min/{1.73_m2} — ABNORMAL LOW (ref >59)
GFR calc non Af Amer: 38 mL/min/{1.73_m2} — ABNORMAL LOW (ref >59)
GLUCOSE: 77 mg/dL (ref 65–99)
POTASSIUM: 4.6 mmol/L (ref 3.5–5.2)
SODIUM: 142 mmol/L (ref 134–144)

## 2018-01-14 LAB — CBC
HEMATOCRIT: 23.2 % — AB (ref 37.5–51.0)
Hemoglobin: 6.2 g/dL — CL (ref 13.0–17.7)
MCH: 17.6 pg — ABNORMAL LOW (ref 26.6–33.0)
MCHC: 26.7 g/dL — AB (ref 31.5–35.7)
MCV: 66 fL — ABNORMAL LOW (ref 79–97)
Platelets: 388 10*3/uL — ABNORMAL HIGH (ref 150–379)
RBC: 3.52 x10E6/uL — ABNORMAL LOW (ref 4.14–5.80)
RDW: 19.1 % — AB (ref 12.3–15.4)
WBC: 8.2 10*3/uL (ref 3.4–10.8)

## 2018-01-14 NOTE — Telephone Encounter (Signed)
Thank you, I agree with your advice. MCr

## 2018-01-14 NOTE — Telephone Encounter (Signed)
   Roy David was seen in the office yesterday complaining of some dyspnea on exertion.  He did not have significant volume overload by exam.  Labs were checked including CBC.  Results are below.  When I saw the labs I called the patient.  I spoke with Roy David on the phone.  He stated that he is still having the dyspnea on exertion and weakness, but his symptoms have not progressed.  He is not aware of any blood loss.  I advised that I felt he should go to the hospital right away, get the blood work rechecked and would probably need a transfusion.  Additionally, he needs evaluation to figure out why this is happening.  He understands the need for this, but does not wish to do this at this time.  He agrees to go to the hospital in the morning.  I advised him to be evaluated by the ER staff and they would determine what to do next.    Lab Results  Component Value Date   WBC 8.2 01/13/2018   HGB 6.2 (LL) 01/13/2018   HCT 23.2 (L) 01/13/2018   MCV 66 (L) 01/13/2018   PLT 388 (H) 01/13/2018    Recent Labs  Lab 01/13/18 1213  NA 142  K 4.6  CL 101  CO2 20  BUN 25  CREATININE 1.61*  CALCIUM 9.3  GLUCOSE 77    Roy Kozma, PA-C 01/14/2018 5:22 PM [facility] [phone]

## 2018-01-15 ENCOUNTER — Observation Stay (HOSPITAL_COMMUNITY)
Admission: EM | Admit: 2018-01-15 | Discharge: 2018-01-17 | Disposition: A | Discharge status: Home / Self Care | Payer: Medicare Other | Attending: Internal Medicine | Admitting: Internal Medicine

## 2018-01-15 ENCOUNTER — Encounter (HOSPITAL_COMMUNITY): Payer: Self-pay

## 2018-01-15 ENCOUNTER — Other Ambulatory Visit: Payer: Self-pay

## 2018-01-15 DIAGNOSIS — D649 Anemia, unspecified: Principal | ICD-10-CM | POA: Insufficient documentation

## 2018-01-15 DIAGNOSIS — Z7901 Long term (current) use of anticoagulants: Secondary | ICD-10-CM | POA: Diagnosis not present

## 2018-01-15 DIAGNOSIS — R791 Abnormal coagulation profile: Secondary | ICD-10-CM | POA: Diagnosis not present

## 2018-01-15 DIAGNOSIS — Z8546 Personal history of malignant neoplasm of prostate: Secondary | ICD-10-CM | POA: Insufficient documentation

## 2018-01-15 DIAGNOSIS — Z87891 Personal history of nicotine dependence: Secondary | ICD-10-CM | POA: Insufficient documentation

## 2018-01-15 DIAGNOSIS — Z7951 Long term (current) use of inhaled steroids: Secondary | ICD-10-CM | POA: Diagnosis not present

## 2018-01-15 DIAGNOSIS — E039 Hypothyroidism, unspecified: Secondary | ICD-10-CM | POA: Insufficient documentation

## 2018-01-15 DIAGNOSIS — E785 Hyperlipidemia, unspecified: Secondary | ICD-10-CM | POA: Diagnosis not present

## 2018-01-15 DIAGNOSIS — I10 Essential (primary) hypertension: Secondary | ICD-10-CM | POA: Insufficient documentation

## 2018-01-15 DIAGNOSIS — I350 Nonrheumatic aortic (valve) stenosis: Secondary | ICD-10-CM | POA: Insufficient documentation

## 2018-01-15 DIAGNOSIS — E7800 Pure hypercholesterolemia, unspecified: Secondary | ICD-10-CM | POA: Diagnosis present

## 2018-01-15 DIAGNOSIS — R011 Cardiac murmur, unspecified: Secondary | ICD-10-CM | POA: Diagnosis not present

## 2018-01-15 DIAGNOSIS — Z79899 Other long term (current) drug therapy: Secondary | ICD-10-CM | POA: Diagnosis not present

## 2018-01-15 DIAGNOSIS — I480 Paroxysmal atrial fibrillation: Secondary | ICD-10-CM | POA: Diagnosis present

## 2018-01-15 LAB — FERRITIN: FERRITIN: 7 ng/mL — AB (ref 24–336)

## 2018-01-15 LAB — TSH: TSH: 3.866 u[IU]/mL (ref 0.350–4.500)

## 2018-01-15 LAB — COMPREHENSIVE METABOLIC PANEL
ALT: 20 U/L (ref 17–63)
AST: 30 U/L (ref 15–41)
Albumin: 3.6 g/dL (ref 3.5–5.0)
Alkaline Phosphatase: 55 U/L (ref 38–126)
Anion gap: 12 (ref 5–15)
BILIRUBIN TOTAL: 0.4 mg/dL (ref 0.3–1.2)
BUN: 22 mg/dL — ABNORMAL HIGH (ref 6–20)
CHLORIDE: 105 mmol/L (ref 101–111)
CO2: 21 mmol/L — ABNORMAL LOW (ref 22–32)
CREATININE: 1.32 mg/dL — AB (ref 0.61–1.24)
Calcium: 9 mg/dL (ref 8.9–10.3)
GFR, EST AFRICAN AMERICAN: 55 mL/min — AB (ref >60)
GFR, EST NON AFRICAN AMERICAN: 47 mL/min — AB (ref >60)
Glucose, Bld: 123 mg/dL — ABNORMAL HIGH (ref 65–99)
Potassium: 4.4 mmol/L (ref 3.5–5.1)
Sodium: 138 mmol/L (ref 135–145)
TOTAL PROTEIN: 6.6 g/dL (ref 6.5–8.1)

## 2018-01-15 LAB — PREPARE RBC (CROSSMATCH)

## 2018-01-15 LAB — PROTIME-INR
INR: >10
Prothrombin Time: >90 seconds — ABNORMAL HIGH (ref 11.4–15.2)

## 2018-01-15 LAB — IRON AND TIBC
IRON: 52 ug/dL (ref 45–182)
SATURATION RATIOS: 10 % — AB (ref 17.9–39.5)
TIBC: 507 ug/dL — AB (ref 250–450)
UIBC: 455 ug/dL

## 2018-01-15 LAB — ABO/RH: ABO/RH(D): A POS

## 2018-01-15 LAB — CBC
HCT: 23.3 % — ABNORMAL LOW (ref 39.0–52.0)
Hemoglobin: 6.2 g/dL — CL (ref 13.0–17.0)
MCH: 17.5 pg — ABNORMAL LOW (ref 26.0–34.0)
MCHC: 26.6 g/dL — ABNORMAL LOW (ref 30.0–36.0)
MCV: 65.8 fL — AB (ref 78.0–100.0)
PLATELETS: 350 10*3/uL (ref 150–400)
RBC: 3.54 MIL/uL — AB (ref 4.22–5.81)
RDW: 20 % — ABNORMAL HIGH (ref 11.5–15.5)
WBC: 6.1 10*3/uL (ref 4.0–10.5)

## 2018-01-15 LAB — VITAMIN B12: Vitamin B-12: 711 pg/mL (ref 180–914)

## 2018-01-15 LAB — HEMOGLOBIN AND HEMATOCRIT, BLOOD
HCT: 26.4 % — ABNORMAL LOW (ref 39.0–52.0)
HEMOGLOBIN: 7.6 g/dL — AB (ref 13.0–17.0)

## 2018-01-15 LAB — RETICULOCYTES
RBC.: 3.46 MIL/uL — ABNORMAL LOW (ref 4.22–5.81)
Retic Count, Absolute: 96.9 10*3/uL (ref 19.0–186.0)
Retic Ct Pct: 2.8 % (ref 0.4–3.1)

## 2018-01-15 LAB — FOLATE: FOLATE: 26.3 ng/mL (ref >5.9)

## 2018-01-15 LAB — POC OCCULT BLOOD, ED: Fecal Occult Bld: NEGATIVE

## 2018-01-15 MED ORDER — LISINOPRIL 20 MG PO TABS
20.0000 mg | ORAL_TABLET | Freq: Every day | ORAL | Status: DC
  Administered 2018-01-16 – 2018-01-17 (×2): 20 mg via ORAL
  Filled 2018-01-15 (×2): qty 1

## 2018-01-15 MED ORDER — HYDROCHLOROTHIAZIDE 25 MG PO TABS
25.0000 mg | ORAL_TABLET | Freq: Every day | ORAL | Status: DC
  Administered 2018-01-16 – 2018-01-17 (×2): 25 mg via ORAL
  Filled 2018-01-15 (×2): qty 1

## 2018-01-15 MED ORDER — GABAPENTIN 300 MG PO CAPS
300.0000 mg | ORAL_CAPSULE | Freq: Two times a day (BID) | ORAL | Status: DC
  Administered 2018-01-16 – 2018-01-17 (×3): 300 mg via ORAL
  Filled 2018-01-15 (×4): qty 1

## 2018-01-15 MED ORDER — LEVOTHYROXINE SODIUM 100 MCG PO TABS
100.0000 ug | ORAL_TABLET | Freq: Every day | ORAL | Status: DC
  Administered 2018-01-16 – 2018-01-17 (×2): 100 ug via ORAL
  Filled 2018-01-15 (×2): qty 1

## 2018-01-15 MED ORDER — SODIUM CHLORIDE 0.9 % IV SOLN
Freq: Once | INTRAVENOUS | Status: AC
  Administered 2018-01-15: 12:00:00 via INTRAVENOUS

## 2018-01-15 MED ORDER — PHYTONADIONE 5 MG PO TABS
10.0000 mg | ORAL_TABLET | Freq: Every day | ORAL | Status: AC
  Administered 2018-01-15 – 2018-01-17 (×3): 10 mg via ORAL
  Filled 2018-01-15 (×3): qty 2

## 2018-01-15 MED ORDER — ACETAMINOPHEN 325 MG PO TABS: 650.0000 mg | ORAL_TABLET | Freq: Four times a day (QID) | ORAL | Status: DC | PRN

## 2018-01-15 MED ORDER — ACETAMINOPHEN 650 MG RE SUPP: 650.0000 mg | Freq: Four times a day (QID) | RECTAL | Status: DC | PRN

## 2018-01-15 MED ORDER — MOMETASONE FUROATE 220 MCG/INH IN AEPB: 1.0000 | INHALATION_SPRAY | Freq: Every day | RESPIRATORY_TRACT | Status: DC

## 2018-01-15 MED ORDER — ONDANSETRON HCL 4 MG/2ML IJ SOLN: 4.0000 mg | Freq: Four times a day (QID) | INTRAMUSCULAR | Status: DC | PRN

## 2018-01-15 MED ORDER — BUDESONIDE 0.25 MG/2ML IN SUSP
0.2500 mg | Freq: Two times a day (BID) | RESPIRATORY_TRACT | Status: DC
  Administered 2018-01-15 – 2018-01-17 (×4): 0.25 mg via RESPIRATORY_TRACT
  Filled 2018-01-15 (×4): qty 2

## 2018-01-15 MED ORDER — ONDANSETRON HCL 4 MG PO TABS: 4.0000 mg | ORAL_TABLET | Freq: Four times a day (QID) | ORAL | Status: DC | PRN

## 2018-01-15 MED ORDER — DOCUSATE SODIUM 100 MG PO CAPS
100.0000 mg | ORAL_CAPSULE | Freq: Two times a day (BID) | ORAL | Status: DC
  Administered 2018-01-16: 100 mg via ORAL
  Filled 2018-01-15 (×2): qty 1

## 2018-01-15 MED ORDER — LISINOPRIL-HYDROCHLOROTHIAZIDE 20-25 MG PO TABS: 1.0000 | ORAL_TABLET | Freq: Every day | ORAL | Status: DC

## 2018-01-15 MED ORDER — EZETIMIBE-SIMVASTATIN 10-20 MG PO TABS
1.0000 | ORAL_TABLET | Freq: Every day | ORAL | Status: DC
  Administered 2018-01-16 – 2018-01-17 (×2): 1 via ORAL
  Filled 2018-01-15 (×2): qty 1

## 2018-01-15 NOTE — H&P (Signed)
History and Physical    Roy David:[id number] DOB: 1930-05-17 DOA: 01/15/2018  PCP: Roy David, Roy Sa, David Consultants:  Roy David - cardiology; Roy David - GI; Roy David - urology Patient coming from:  Home - lives with wife; NOK: wife, [phone]  Chief Complaint:  weakness  HPI: Roy David is a 84 y.o. 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  He went on Xarelto in 7/18 and shortly therafter he got tired with no energy and then he got SOB.  He was seen for DOE in 9/18 which the patient attributed to the Xarelto; labs appear to have been ordered in the office that day and show Hgb 14.1.  Symptoms continued and he thought it was his heart so he called the cardiology office and they scheduled an appointment.  He was seen by cards on Monday and they drew blood.  His Hgb was 6.2; they called him yesterday and encouraged him to come in.  SOB is with any exertion.  No cough.  No fever.  He does bleed easily with injuries but there has been no apparent blood in the stool and he has noticed any obvious bleeding.  He was previously started on Eliquis after an implantable loop recorder clearly showed PAF and was also unable to tolerate this medication (? Different reasons).     ED Course:  3 months of gradual fatigue, weakness, exertional dyspnea.  On Xarelto for h/o afib and central retinal artery occlusion.  Heme negative.  Hgb 6.2.  Otherwise stable.  Review of Systems: As per HPI; otherwise review of systems reviewed and negative.   Ambulatory Status:  Ambulates without assistance  Past Medical History:  Diagnosis Date  . Arthritis   . Asthma    followed at [facility], stable on Asmanex  . Diverticulosis   . H/O seasonal allergies    Dr Roy David  . Hypercholesteremia   . Hypertension   . Paroxysmal atrial fibrillation ([place])  03/06/2016   on Xarelto  . Prostate cancer [facility])    12/10- Dr Roy David/Dr Roy David  . Thyroid disease    hypothyroidism    Past Surgical History:  Procedure Laterality Date  . APPENDECTOMY    . EP IMPLANTABLE DEVICE N/A 11/29/2015   Procedure: Loop Recorder Insertion;  Surgeon: Roy David;  Location: [place] [facility];  Service: Cardiovascular;  Laterality: N/A;  . HERNIA REPAIR    . RETINAL DETACHMENT SURGERY      Social History   Socioeconomic History  . Marital status: Married    Spouse name: Not on file  . Number of children: Not on file  . Years of education: Not on file  . Highest education level: Not on file  Social Needs  . Financial resource strain: Not on file  . Food insecurity - worry: Not on file  . Food insecurity - inability: Not on file  . Transportation needs - medical: Not on file  . Transportation needs - non-medical: Not on file  Occupational History  . Occupation: retired  Tobacco Use  . Smoking status: Former Smoker    Last attempt to quit: 12/09/1959    Years since quitting: 58.1  . Smokeless tobacco: Never Used  Substance and Sexual Activity  . Alcohol use: Yes    Alcohol/week: 8.4 oz    Types: 7 Shots of liquor, 7 Cans of beer per  week  . Drug use: No  . Sexual activity: Not on file  Other Topics Concern  . Not on file  Social History Narrative   Tobacco use cigarettes : Former smoker, Quit in year 1955   Occupation : retired/  married    Allergies  Allergen Reactions  . Bee Venom Nausea Only and Other (See Comments)    Wasp and yellow jackets    Family History  Problem Relation Age of Onset  . Heart attack Mother   . Stroke Father   . Stroke Sister     Prior to Admission medications   Medication Sig Start Date End Date Taking? Authorizing Provider  EPINEPHrine 0.3 mg/0.3 mL IJ SOAJ injection Inject 0.3 mg into the muscle once.    Provider, Historical, David  ezetimibe-simvastatin (VYTORIN) 10-20 MG per tablet Take 1  tablet by mouth daily.    Provider, Historical, David  gabapentin (NEURONTIN) 300 MG capsule Take 300 mg by mouth 3 (three) times daily.    Provider, Historical, David  Glucosamine HCl ([facility]S GLUCOSAMINE) 1500 MG TABS Take 2 tablets by mouth daily.    Provider, Historical, David  levothyroxine (SYNTHROID, LEVOTHROID) 112 MCG tablet Take 110 mcg by mouth daily.  10/06/15   Provider, Historical, David  lisinopril-hydrochlorothiazide (PRINZIDE,ZESTORETIC) 20-25 MG tablet Take 1 tablet by mouth daily.    Provider, Historical, David  mometasone (ASMANEX 60 METERED DOSES) 220 MCG/INH inhaler Inhale 1 Inhaler into the lungs daily. 10/18/15   Provider, Historical, David  XARELTO 15 MG TABS tablet TAKE 1 TABLET(15 MG) BY MOUTH DAILY WITH SUPPER 08/15/17   Roy David, Roy Gobble, David    Physical Exam: Vitals:   01/15/18 1230 01/15/18 1244 01/15/18 1245 01/15/18 1259  BP: 135/70  (!) 155/82 (!) 148/82  Pulse: 69  80 79  Resp: 18  (!) 21 20  Temp:    98.1 F (36.7 C)  TempSrc:  Oral  Oral  SpO2: 100%  98% 97%     General:  Appears calm and comfortable and is NAD; appears younger than stated age Eyes:  PERRL, EOMI, normal lids, iris ENT:  grossly normal hearing, lips & tongue, mmm; appropriate dentition Neck:  no LAD, masses or thyromegaly Cardiovascular:  RRR, 4/6 murmur, no r/g. No LE edema.  Respiratory:   CTA bilaterally with no wheezes/rales/rhonchi.  Normal respiratory effort. Abdomen:  soft, NT, ND, NABS Skin:  no rash or induration seen on limited exam Musculoskeletal:  grossly normal tone BUE/BLE, good ROM, no bony abnormality Psychiatric:  grossly normal mood and affect, speech fluent and appropriate, AOx3 Neurologic:  CN 2-12 grossly intact, moves all extremities in coordinated fashion, sensation intact    Radiological Exams on Admission: No results found.  EKG: not done   Labs on Admission: I have personally reviewed the available labs and imaging studies at the time of the admission.  Pertinent labs:    CO2 21 Glucose 123 BUN 22/Creatinine 1.32/GFR 47 WBC 6.1 Hgb 6.2; 14.1 in 11/11 Platelets 350 Heme negative  Assessment/Plan Principal Problem:   Symptomatic anemia Active Problems:   Essential hypertension   Hyperlipidemia   Paroxysmal atrial fibrillation (HCC)   Long term current use of anticoagulant   Supratherapeutic INR   Symptomatic anemia -Patient with progressive weakness and DOE resulting in Hgb 6.2 -MCV is 65.8; <80 is microcytic anemia and < 70 indicates probable iron deficiency -With an MCV <70, elevated RDW (20.0) - iron deficiency can be presumed -I suspect that this is the result of chronic occult  blood loss directly associated with the Xarelto (+/- associated with the elevated INR, see below) -Will admit to med surg bed. -Transfuse 2 units PRBC to start and recheck Hgb afterwards.  -With starting Hgb close to 7 and no significant known CAD, 2 units is likely sufficient. -Patient counseled about short- and long-term risks associated with transfusion and consents to receive blood products. -Given heme negative stools and likely source of bleeding, no GI consult at this time. -IV iron could also be considered but if the Xarelto is the source and the patient feels better after transfusion, IV iron is likely an unnecessary risk.  Afib on Xarelto with supratherapeutic INR -Documented a fib on loop recorder (still implanted) -Patient had difficulties with Eliquis and now this with Xarelto and his wife took Coumadin in the past -After a long discussion regarding stroke risk off AC, the patient and his wife are quite clear that they prefer to take their risks with 81 mg ASA daily and no longer desire anticoagulation -Xarelto has been discontinued -Regarding supratherapeutic INR, patient was initially discussed with PharmD who reports that there can be a reaction between the INR assay and the Xarelto resulting in a falsely elevated INR; she did not recommend KCentra or FFP  for this patient. -Patient was then discussed with Dr. Alen Blew.  He thinks the Xarelto is less likely the culprit and wonders whether the patient has an underlying vitamin K deficiency.  Regardless, he recommends PO vitamin K 10 mg x 3 days with daily INR. -I then discussed both of these conversations with the patient and his family.  We are in agreement with discontinuation of Xarelto; initiation of 81 mg ASA daily once INR approaches therapeutic range; and the need for more thorough investigation again in the future if this issue (either symptomatic anemia or supratherapeutic INR) ever recurs off anticoagulation.  HTN -He is taking Lisinopril-HCTZ chronically -Will continue but consider discontinuation of HCTZ component based on CKD stage 3 (present on last check in 2011 and today)  HLD -Continue Vytorin  Hypothyroidism -Check TSH -Continue Synthroid at current dose for now   DVT prophylaxis: SCDs Code Status: Full - confirmed with patient/family Family Communication: Wife and son present throughout evaluation Disposition Plan:  Home once clinically improved Consults called: Heme/onc and pharmacy by telephone only  Admission status: Admit - It is my clinical opinion that admission to INPATIENT is reasonable and necessary because of the expectation that this patient will require hospital care that crosses at least 2 midnights to treat this condition based on the medical complexity of the problems presented.  Given the aforementioned information, the predictability of an adverse outcome is felt to be significant.    Karmen Bongo David [facility]  If note is complete, please contact covering daytime or nighttime physician. [email or web address] Password [email or web address]  01/15/2018, 1:22 PM

## 2018-01-15 NOTE — ED Notes (Signed)
Diet tray ordered for pt 

## 2018-01-15 NOTE — ED Notes (Signed)
Vitals were completed at 1658, unable to change charting time for blood administration

## 2018-01-15 NOTE — ED Triage Notes (Signed)
Patient sent by his MD for 3 months of increased fatigue. Denies pain, alert and oriented. Was called this am and told Hgb 6.5, no SOB, denies dark stools. ambulatory

## 2018-01-15 NOTE — ED Provider Notes (Signed)
[place] EMERGENCY DEPARTMENT Provider Note   CSN: [id number] Arrival date & time: 01/15/18  2536     History   Chief Complaint Chief Complaint  Patient presents with  . Hgb 6.5    HPI Roy David is a 84 y.o. male.  Patient is an 84 year old male with a history of hypertension, paroxysmal atrial fibrillation, prostate cancer, thyroid disease, asthma who is currently on Xarelto after having a central retinal artery occlusion presenting today with 3 months of worsening fatigue, weakness, exertional dyspnea.  Patient saw his doctor on Monday for the symptoms and was told that his hemoglobin was low and was sent here for further care.  He denies any abdominal pain, weight loss and no dark stools.  He denies any chest pain or tightness.  Symptoms are all with exertion.   The history is provided by the patient.    Past Medical History:  Diagnosis Date  . Arthritis   . Asthma   . Diverticulosis   . H/O seasonal allergies    Dr [name]  . Hypercholesteremia   . Hypertension   . Paroxysmal atrial fibrillation ([place]) 03/06/2016  . Prostate cancer [facility])    12/10- Dr Dahlstedt/Dr Valere Dross  . Thyroid disease    hypothyroidism    Patient Active Problem List   Diagnosis Date Noted  . Long term current use of anticoagulant 09/04/2017  . Encounter for loop recorder check 07/27/2016  . Paroxysmal atrial fibrillation ([place]) 03/06/2016  . Central retinal artery occlusion of right eye 11/23/2015  . Moderate aortic valve stenosis 11/23/2015  . Essential hypertension 11/23/2015  . Hyperlipidemia 11/23/2015    Past Surgical History:  Procedure Laterality Date  . EP IMPLANTABLE DEVICE N/A 11/29/2015   Procedure: Loop Recorder Insertion;  Surgeon: Sanda Klein, MD;  Location: [place] [facility];  Service: Cardiovascular;  Laterality: N/A;       Home Medications    Prior to Admission medications   Medication Sig Start Date End Date Taking? Authorizing Provider    EPINEPHrine 0.3 mg/0.3 mL IJ SOAJ injection Inject 0.3 mg into the muscle once.    [provider]  ezetimibe-simvastatin (VYTORIN) 10-20 MG per tablet Take 1 tablet by mouth daily.    [provider]  gabapentin (NEURONTIN) 300 MG capsule Take 300 mg by mouth 3 (three) times daily.    [provider]  Glucosamine HCl ([facility]S GLUCOSAMINE) 1500 MG TABS Take 2 tablets by mouth daily.    [provider]  levothyroxine (SYNTHROID, LEVOTHROID) 112 MCG tablet Take 110 mcg by mouth daily.  10/06/15   [provider]  lisinopril-hydrochlorothiazide (PRINZIDE,ZESTORETIC) 20-25 MG tablet Take 1 tablet by mouth daily.    [provider]  mometasone (ASMANEX 60 METERED DOSES) 220 MCG/INH inhaler Inhale 1 Inhaler into the lungs daily. 10/18/15   [provider]  XARELTO 15 MG TABS tablet TAKE 1 TABLET(15 MG) BY MOUTH DAILY WITH SUPPER 08/15/17   Croitoru, Dani Gobble, MD    Family History Family History  Problem Relation Age of Onset  . Heart attack Mother   . Stroke Father   . Stroke Sister     Social History Social History   Tobacco Use  . Smoking status: Former Smoker    Last attempt to quit: 12/09/1959    Years since quitting: 58.1  . Smokeless tobacco: Never Used  Substance Use Topics  . Alcohol use: Yes    Alcohol/week: 3.6 oz    Types: 6 Shots of liquor per  week  . Drug use: Not on file     Allergies   Bee venom   Review of Systems Review of Systems  All other systems reviewed and are negative.    Physical Exam Updated Vital Signs BP 136/76 (BP Location: Right Arm)   Pulse 88   Temp 98 F (36.7 C) (Oral)   Resp 18   SpO2 99%   Physical Exam  Constitutional: He is oriented to person, place, and time. He appears well-developed and well-nourished. No distress.  HENT:  Head: Normocephalic and atraumatic.  Mouth/Throat: Oropharynx is clear and moist.  Eyes: EOM are normal. Pupils are equal, round, and reactive to  light.  Pale conjunctive a  Neck: Normal range of motion. Neck supple.  Cardiovascular: Normal rate, regular rhythm and intact distal pulses.  Murmur heard.  Systolic murmur is present with a grade of 3/6. Pulmonary/Chest: Effort normal and breath sounds normal. No respiratory distress. He has no wheezes. He has no rales.  Abdominal: Soft. He exhibits no distension. There is no tenderness. There is no rebound and no guarding.  Musculoskeletal: Normal range of motion. He exhibits no edema or tenderness.  Neurological: He is alert and oriented to person, place, and time.  Skin: Skin is warm and dry. Capillary refill takes less than 2 seconds. No rash noted. No erythema. There is pallor.  Psychiatric: He has a normal mood and affect. His behavior is normal.  Nursing note and vitals reviewed.    ED Treatments / Results  Labs (all labs ordered are listed, but only abnormal results are displayed) Labs Reviewed  COMPREHENSIVE METABOLIC PANEL - Abnormal; Notable for the following components:      Result Value   CO2 21 (*)    Glucose, Bld 123 (*)    BUN 22 (*)    Creatinine, Ser 1.32 (*)    GFR calc non Af Amer 47 (*)    GFR calc Af Amer 55 (*)    All other components within normal limits  CBC - Abnormal; Notable for the following components:   RBC 3.54 (*)    Hemoglobin 6.2 (*)    HCT 23.3 (*)    MCV 65.8 (*)    MCH 17.5 (*)    MCHC 26.6 (*)    RDW 20.0 (*)    All other components within normal limits  PROTIME-INR - Abnormal; Notable for the following components:   Prothrombin Time >90.0 (*)    INR >10.00 (*)    All other components within normal limits  VITAMIN B12  FOLATE  IRON AND TIBC  FERRITIN  RETICULOCYTES  POC OCCULT BLOOD, ED  TYPE AND SCREEN  ABO/RH  PREPARE RBC (CROSSMATCH)    EKG  EKG Interpretation None       Radiology No results found.  Procedures Procedures (including critical care time)  Medications Ordered in ED Medications  0.9 %  sodium  chloride infusion (not administered)     Initial Impression / Assessment and Plan / ED Course  I have reviewed the triage vital signs and the nursing notes.  Pertinent labs & imaging results that were available during my care of the patient were reviewed by me and considered in my medical decision making (see chart for details).    Patient presenting today with symptoms of worsening fatigue, exertional dyspnea and weakness that has been ongoing in the last 3 months.  Patient found to have a hemoglobin of 6.2 today which is most likely the cause of his  symptoms.  Patient does take Xarelto but denies any dark stool or signs of bleeding.  Hemoccult here today is negative.  Anemia panel was placed.  Patient's CMP was unchanged from his baseline.  Will transfuse 2 units which patient is agreeable to.  Will admit for further care.  PT/INR is >10.  Pt is not on coumadin  CRITICAL CARE Performed by: Evany Schecter Total critical care time: 30 minutes Critical care time was exclusive of separately billable procedures and treating other patients. Critical care was necessary to treat or prevent imminent or life-threatening deterioration. Critical care was time spent personally by me on the following activities: development of treatment plan with patient and/or surrogate as well as nursing, discussions with consultants, evaluation of patient's response to treatment, examination of patient, obtaining history from patient or surrogate, ordering and performing treatments and interventions, ordering and review of laboratory studies, ordering and review of radiographic studies, pulse oximetry and re-evaluation of patient's condition.   Final Clinical Impressions(s) / ED Diagnoses   Final diagnoses:  Anemia, unspecified type    ED Discharge Orders    None       Blanchie Dessert, MD 01/15/18 1205

## 2018-01-16 DIAGNOSIS — I350 Nonrheumatic aortic (valve) stenosis: Secondary | ICD-10-CM | POA: Diagnosis not present

## 2018-01-16 DIAGNOSIS — Z7951 Long term (current) use of inhaled steroids: Secondary | ICD-10-CM | POA: Diagnosis not present

## 2018-01-16 DIAGNOSIS — Z79899 Other long term (current) drug therapy: Secondary | ICD-10-CM | POA: Diagnosis not present

## 2018-01-16 DIAGNOSIS — Z87891 Personal history of nicotine dependence: Secondary | ICD-10-CM | POA: Diagnosis not present

## 2018-01-16 DIAGNOSIS — Z8546 Personal history of malignant neoplasm of prostate: Secondary | ICD-10-CM | POA: Diagnosis not present

## 2018-01-16 DIAGNOSIS — I10 Essential (primary) hypertension: Secondary | ICD-10-CM | POA: Diagnosis not present

## 2018-01-16 DIAGNOSIS — D649 Anemia, unspecified: Secondary | ICD-10-CM

## 2018-01-16 DIAGNOSIS — Z7901 Long term (current) use of anticoagulants: Secondary | ICD-10-CM | POA: Diagnosis not present

## 2018-01-16 DIAGNOSIS — E039 Hypothyroidism, unspecified: Secondary | ICD-10-CM | POA: Diagnosis not present

## 2018-01-16 DIAGNOSIS — R791 Abnormal coagulation profile: Secondary | ICD-10-CM | POA: Diagnosis not present

## 2018-01-16 DIAGNOSIS — E785 Hyperlipidemia, unspecified: Secondary | ICD-10-CM | POA: Diagnosis not present

## 2018-01-16 DIAGNOSIS — I480 Paroxysmal atrial fibrillation: Secondary | ICD-10-CM | POA: Diagnosis not present

## 2018-01-16 LAB — BASIC METABOLIC PANEL
Anion gap: 12 (ref 5–15)
BUN: 17 mg/dL (ref 6–20)
CHLORIDE: 102 mmol/L (ref 101–111)
CO2: 21 mmol/L — ABNORMAL LOW (ref 22–32)
CREATININE: 1.29 mg/dL — AB (ref 0.61–1.24)
Calcium: 8.8 mg/dL — ABNORMAL LOW (ref 8.9–10.3)
GFR calc Af Amer: 57 mL/min — ABNORMAL LOW (ref >60)
GFR, EST NON AFRICAN AMERICAN: 49 mL/min — AB (ref >60)
GLUCOSE: 99 mg/dL (ref 65–99)
Potassium: 3.9 mmol/L (ref 3.5–5.1)
SODIUM: 135 mmol/L (ref 135–145)

## 2018-01-16 LAB — BPAM RBC
BLOOD PRODUCT EXPIRATION DATE: 201902122359
BLOOD PRODUCT EXPIRATION DATE: 201902192359
ISSUE DATE / TIME: 201902061220
ISSUE DATE / TIME: 201902061624
UNIT TYPE AND RH: 6200
Unit Type and Rh: 600

## 2018-01-16 LAB — TYPE AND SCREEN
ABO/RH(D): A POS
Antibody Screen: NEGATIVE
UNIT DIVISION: 0
UNIT DIVISION: 0

## 2018-01-16 LAB — CBC
HCT: 25.7 % — ABNORMAL LOW (ref 39.0–52.0)
Hemoglobin: 7.5 g/dL — ABNORMAL LOW (ref 13.0–17.0)
MCH: 20.1 pg — ABNORMAL LOW (ref 26.0–34.0)
MCHC: 29.2 g/dL — AB (ref 30.0–36.0)
MCV: 68.9 fL — AB (ref 78.0–100.0)
PLATELETS: 302 10*3/uL (ref 150–400)
RBC: 3.73 MIL/uL — ABNORMAL LOW (ref 4.22–5.81)
RDW: 21.8 % — AB (ref 11.5–15.5)
WBC: 7.2 10*3/uL (ref 4.0–10.5)

## 2018-01-16 LAB — PROTIME-INR
INR: 1.28
Prothrombin Time: 15.9 seconds — ABNORMAL HIGH (ref 11.4–15.2)

## 2018-01-16 NOTE — Care Management Note (Signed)
Case Management Note  Patient Details  Name: Roy David MRN: [id number] Date of Birth: 1930/10/14  Subjective/Objective:   Admitted for Acute Symptomatic Anemia  Action/Plan: Likely exacerbant is xarelto as symptoms match timeline of starting it 3 months ago. s/p 2 U PRBC on 2/6, last two hgb checks have remained stable.Plan to monitor over 24 hours both cbc and INR and likely d/c home on aspirin on 2/8   Expected Discharge Date:  01/17/18               Expected Discharge Plan:  Home/Self Care  Discharge planning Services  CM Consult  Status of Service:  In process, will continue to follow  Additional Comments: Prior to admission lived at home with spouse.  PCP is Dr L. Donnie Coffin. Patent attorney.  NCM will continue to follow for discharge needs.  Kristen Cardinal, RN  Nurse case manager [phone]. 01/16/2018, 3:05 PM

## 2018-01-16 NOTE — Progress Notes (Signed)
PROGRESS NOTE  Roy David POE:[id number] DOB: 04/30/1930 DOA: 01/15/2018 PCP: [name], L.Marlou Sa, MD  HPI/Recap of past 24 hours:  Roy David is a 84 y.o. 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 Patient has since discussed benefits vs risks of continuing xarelto and has opted to do aspirin for his stroke risk related to his atrial fibrillation.      This morning he is doing very well. Family ( wife, son, extended family at bedside). Denies any chest pain or shortness of breath  Assessment/Plan: Principal Problem:   Symptomatic anemia Active Problems:   Essential hypertension   Hyperlipidemia   Paroxysmal atrial fibrillation (HCC)   Long term current use of anticoagulant   Supratherapeutic INR  Acute Symptomatic Anemia, suspect due to blood loss No signs of bleeding here or prior to admission. Iron profile consistent with iron deficiency anemia. Likely exacerbant is xarelto as symptoms match timeline of starting it 3 months ago. s/p 2 U PRBC on 2/6, last two hgb checks have remained stable. Plan to monitor over 24 hours both cbc and INR and likely d/c home on aspirin on 2/8   Supratherapeutic INR Patient not on coumadin. Unclear why INR elevated. LFTs, t bili all normal do not suspect liver dysfunction.On xarelto currently, previously on Eliquis( made him very drowsy). Its possible xarelto could cross react with pharmacy Receiving vitamin K 10 mg x 3 doses while here. Monitor INR accordingly   Paroxsymal Atrial fibrillation, rate controlled  CHADSVASC 5( HTN:1, Age:62, CVA2, HTN 1) Previously on elquis and now xarelto. Patient understands increase risk for stroke with discontinuation of xarelto but would like  to do aspirin from now on in light of profound anemia and associated symptoms.  Will make his cardiologist aware of change  Moderate aortic stenosis, stable  HTN, stable Continue home lisinopril-HCTZ  HLD, stable Continue vytorin  Hypothyroidism, stable TSH 3.8. Continue home synthroid  Code Status: Full Code  Family Communication: Family updated at bedside   Disposition Plan: monitor over 24 hours if hgb and INR remain stable discharge on 2/8   Consultants:  None   Procedures:  None   Antimicrobials:  None   Cultures:  None  DVT prophylaxis:  SCDs   Objective: Vitals:   01/15/18 2216 01/16/18 0422 01/16/18 0949 01/16/18 0950  BP: 126/66 114/67  129/66  Pulse: 71 65  74  Resp: 18 16  16   Temp: 99.3 F (37.4 C) 98.7 F (37.1 C)  97.9 F (36.6 C)  TempSrc: Oral Oral  Oral  SpO2: 98% 96% 97% 99%  Weight: 73.8 kg (162 lb 12.8 oz)       Intake/Output Summary (Last 24 hours) at 01/16/2018 1148 Last data filed at 01/16/2018 0900 Gross per 24 hour  Intake 945 ml  Output 0 ml  Net 945 ml   Filed Weights   01/15/18 2216  Weight: 73.8 kg (162 lb 12.8 oz)    Exam:  Gen. Sitting in bed, pleasant CV. RRR, SEM 4/6 loudest in LUSB, no peripheral edema GI. Soft, nontender, nondistended Neuro. alert and oriented x3, moving all extremities with no deficits Skin. No bruising  Data Reviewed: CBC: Recent Labs  Lab 01/13/18 1213 01/15/18 0944 01/15/18 2045 01/16/18 0604  WBC 8.2 6.1  --  7.2  HGB 6.2* 6.2* 7.6* 7.5*  HCT 23.2* 23.3* 26.4* 25.7*  MCV 66* 65.8*  --  68.9*  PLT 388* 350  --  283   Basic Metabolic Panel: Recent Labs  Lab 01/13/18 1213 01/15/18 0944 01/16/18 0604  NA 142 138 135  K 4.6 4.4 3.9  CL 101 105 102  CO2 20 21* 21*  GLUCOSE 77 123* 99  BUN 25 22* 17  CREATININE 1.61* 1.32* 1.29*  CALCIUM 9.3 9.0 8.8*   GFR: Estimated Creatinine Clearance: 40.5 mL/min (A) (by C-G formula based on SCr of 1.29 mg/dL (H)). Liver Function  Tests: Recent Labs  Lab 01/15/18 0944  AST 30  ALT 20  ALKPHOS 55  BILITOT 0.4  PROT 6.6  ALBUMIN 3.6   No results for input(s): LIPASE, AMYLASE in the last 168 hours. No results for input(s): AMMONIA in the last 168 hours. Coagulation Profile: Recent Labs  Lab 01/15/18 0944 01/16/18 0604  INR >10.00* 1.28   Cardiac Enzymes: No results for input(s): CKTOTAL, CKMB, CKMBINDEX, TROPONINI in the last 168 hours. BNP (last 3 results) No results for input(s): PROBNP in the last 8760 hours. HbA1C: No results for input(s): HGBA1C in the last 72 hours. CBG: No results for input(s): GLUCAP in the last 168 hours. Lipid Profile: No results for input(s): CHOL, HDL, LDLCALC, TRIG, CHOLHDL, LDLDIRECT in the last 72 hours. Thyroid Function Tests: Recent Labs    01/15/18 1901  TSH 3.866   Anemia Panel: Recent Labs    01/15/18 1145  VITAMINB12 711  FOLATE 26.3  FERRITIN 7*  TIBC 507*  IRON 52  RETICCTPCT 2.8   Urine analysis: No results found for: COLORURINE, APPEARANCEUR, LABSPEC, PHURINE, GLUCOSEU, HGBUR, BILIRUBINUR, KETONESUR, PROTEINUR, UROBILINOGEN, NITRITE, LEUKOCYTESUR Sepsis Labs: @LABRCNTIP (procalcitonin:4,lacticidven:4)  )No results found for this or any previous visit (from the past 240 hour(s)).    Studies: No results found.  Scheduled Meds: . budesonide  0.25 mg Nebulization BID  . docusate sodium  100 mg Oral BID  . ezetimibe-simvastatin  1 tablet Oral Daily  . gabapentin  300 mg Oral BID  . hydrochlorothiazide  25 mg Oral Daily  . levothyroxine  100 mcg Oral QAC breakfast  . lisinopril  20 mg Oral Daily  . phytonadione  10 mg Oral Daily    Continuous Infusions:   LOS: 1 day     Desiree Hane, MD [facility] Pager [phone]  If 7PM-7AM, please contact night-coverage [email or web address] Password [facility] 01/16/2018, 11:48 AM

## 2018-01-16 NOTE — Care Management Obs Status (Signed)
[place] NOTIFICATION   Patient Details  Name: Roy David MRN: [id number] Date of Birth: 08/16/1930   Medicare Observation Status Notification Given:  Yes    Apolonio Schneiders, RN 01/16/2018, 6:04 PM

## 2018-01-16 NOTE — Care Management CC44 (Signed)
Condition Code 44 Documentation Completed  Patient Details  Name: Roy David MRN: [id number] Date of Birth: 20-May-1930   Condition Code 44 given:  Yes Patient signature on Condition Code 44 notice:  Yes Documentation of 2 MD's agreement:  Yes Code 44 added to claim:  Yes    Apolonio Schneiders, RN 01/16/2018, 6:22 PM

## 2018-01-17 ENCOUNTER — Other Ambulatory Visit (HOSPITAL_COMMUNITY): Payer: Medicare Other

## 2018-01-17 DIAGNOSIS — Z7901 Long term (current) use of anticoagulants: Secondary | ICD-10-CM | POA: Diagnosis not present

## 2018-01-17 DIAGNOSIS — I480 Paroxysmal atrial fibrillation: Secondary | ICD-10-CM | POA: Diagnosis not present

## 2018-01-17 DIAGNOSIS — Z7951 Long term (current) use of inhaled steroids: Secondary | ICD-10-CM | POA: Diagnosis not present

## 2018-01-17 DIAGNOSIS — I350 Nonrheumatic aortic (valve) stenosis: Secondary | ICD-10-CM | POA: Diagnosis not present

## 2018-01-17 DIAGNOSIS — I10 Essential (primary) hypertension: Secondary | ICD-10-CM | POA: Diagnosis not present

## 2018-01-17 DIAGNOSIS — D649 Anemia, unspecified: Secondary | ICD-10-CM | POA: Diagnosis not present

## 2018-01-17 DIAGNOSIS — Z8546 Personal history of malignant neoplasm of prostate: Secondary | ICD-10-CM | POA: Diagnosis not present

## 2018-01-17 DIAGNOSIS — E039 Hypothyroidism, unspecified: Secondary | ICD-10-CM | POA: Diagnosis not present

## 2018-01-17 DIAGNOSIS — Z87891 Personal history of nicotine dependence: Secondary | ICD-10-CM | POA: Diagnosis not present

## 2018-01-17 DIAGNOSIS — R791 Abnormal coagulation profile: Secondary | ICD-10-CM | POA: Diagnosis not present

## 2018-01-17 DIAGNOSIS — Z79899 Other long term (current) drug therapy: Secondary | ICD-10-CM | POA: Diagnosis not present

## 2018-01-17 DIAGNOSIS — E785 Hyperlipidemia, unspecified: Secondary | ICD-10-CM | POA: Diagnosis not present

## 2018-01-17 LAB — PROTIME-INR
INR: 1.08
Prothrombin Time: 14 seconds (ref 11.4–15.2)

## 2018-01-17 LAB — CBC
HEMATOCRIT: 27.6 % — AB (ref 39.0–52.0)
HEMOGLOBIN: 8 g/dL — AB (ref 13.0–17.0)
MCH: 20.4 pg — ABNORMAL LOW (ref 26.0–34.0)
MCHC: 29 g/dL — ABNORMAL LOW (ref 30.0–36.0)
MCV: 70.4 fL — ABNORMAL LOW (ref 78.0–100.0)
Platelets: 306 10*3/uL (ref 150–400)
RBC: 3.92 MIL/uL — ABNORMAL LOW (ref 4.22–5.81)
RDW: 22.8 % — ABNORMAL HIGH (ref 11.5–15.5)
WBC: 6.9 10*3/uL (ref 4.0–10.5)

## 2018-01-17 MED ORDER — ASPIRIN EC 81 MG PO TBEC: 81.0000 mg | DELAYED_RELEASE_TABLET | Freq: Every day | ORAL | 2 refills | Status: DC

## 2018-01-17 NOTE — Discharge Instructions (Signed)
Anemia Anemia is a condition in which you do not have enough red blood cells or hemoglobin. Hemoglobin is a substance in red blood cells that carries oxygen. When you do not have enough red blood cells or hemoglobin (are anemic), your body cannot get enough oxygen and your organs may not work properly. As a result, you may feel very tired or have other problems. What are the causes? Common causes of anemia include:  Excessive bleeding. Anemia can be caused by excessive bleeding inside or outside the body, including bleeding from the intestine or from periods in women.  Poor nutrition.  Long-lasting (chronic) kidney, thyroid, and liver disease.  Bone marrow disorders.  Cancer and treatments for cancer.  HIV (human immunodeficiency virus) and AIDS (acquired immunodeficiency syndrome).  Treatments for HIV and AIDS.  Spleen problems.  Blood disorders.  Infections, medicines, and autoimmune disorders that destroy red blood cells.  What are the signs or symptoms? Symptoms of this condition include:  Minor weakness.  Dizziness.  Headache.  Feeling heartbeats that are irregular or faster than normal (palpitations).  Shortness of breath, especially with exercise.  Paleness.  Cold sensitivity.  Indigestion.  Nausea.  Difficulty sleeping.  Difficulty concentrating.  Symptoms may occur suddenly or develop slowly. If your anemia is mild, you may not have symptoms. How is this diagnosed? This condition is diagnosed based on:  Blood tests.  Your medical history.  A physical exam.  Bone marrow biopsy.  Your health care provider may also check your stool (feces) for blood and may do additional testing to look for the cause of your bleeding. You may also have other tests, including:  Imaging tests, such as a CT scan or MRI.  Endoscopy.  Colonoscopy.  How is this treated? Treatment for this condition depends on the cause. If you continue to lose a lot of  blood, you may need to be treated at a hospital. Treatment may include:  Taking supplements of iron, vitamin O16, or folic acid.  Taking a hormone medicine (erythropoietin) that can help to stimulate red blood cell growth.  Having a blood transfusion. This may be needed if you lose a lot of blood.  Making changes to your diet.  Having surgery to remove your spleen.  Follow these instructions at home:  Take over-the-counter and prescription medicines only as told by your health care provider.  Take supplements only as told by your health care provider.  Follow any diet instructions that you were given.  Keep all follow-up visits as told by your health care provider. This is important. Contact a health care provider if:  You develop new bleeding anywhere in the body. Get help right away if:  You are very weak.  You are short of breath.  You have pain in your abdomen or chest.  You are dizzy or feel faint.  You have trouble concentrating.  You have bloody or black, tarry stools.  You vomit repeatedly or you vomit up blood. Summary  Anemia is a condition in which you do not have enough red blood cells or enough of a substance in your red blood cells that carries oxygen (hemoglobin).  Symptoms may occur suddenly or develop slowly.  If your anemia is mild, you may not have symptoms.  This condition is diagnosed with blood tests as well as a medical history and physical exam. Other tests may be needed.  Treatment for this condition depends on the cause of the anemia. This information is not intended to  advice given to you by your health care provider. Make sure you discuss any questions you have with your health care provider. Document Released: 01/03/2005 Document Revised: 12/28/2016 Document Reviewed: 12/28/2016 Elsevier Interactive Patient Education  2018 [place] has been discontinued. No longer take this

## 2018-01-17 NOTE — Progress Notes (Signed)
Patient discharged to home, AVS reviewed with pateint and family, IV removed, patient left floor via wheelchair with staff.

## 2018-01-17 NOTE — Discharge Summary (Signed)
Discharge Summary  Roy David HKV:[id number] DOB: [birth year]  PCP: [name], L.Marlou Sa, MD  Admit date: 01/15/2018 Discharge date: 01/17/2018  Time spent: Less than 25 minutes  Admitted From: Home Disposition: Home  Recommendations for Outpatient Follow-up:  1. Follow up with PCP/cardiologist in 1-2 weeks.  Repeat INR and CBC for hemoglobin   Home Health: No Equipment/Devices: None  Discharge Diagnoses:  Active Hospital Problems   Diagnosis Date Noted  . Symptomatic anemia 01/15/2018  . Supratherapeutic INR 01/15/2018  . Long term current use of anticoagulant 09/04/2017  . Paroxysmal atrial fibrillation ([place]) 03/06/2016  . Essential hypertension 11/23/2015  . Hyperlipidemia 11/23/2015    Resolved Hospital Problems  No resolved problems to display.    Discharge Condition: Stable CODE STATUS: Full Diet recommendation: Heart Healthy   Vitals:   01/17/18 0926 01/17/18 0947  BP:  122/72  Pulse:  71  Resp:  18  Temp:  98.2 F (36.8 C)  SpO2: 99% 100%    History of present illness:  Keyan Folson is a 84 y.o. 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  None none     Hospital Course:  Principal Problem:   Symptomatic anemia Active Problems:   Essential hypertension   Hyperlipidemia   Paroxysmal atrial fibrillation (H[facility])   Long term current use of anticoagulant   Supratherapeutic INR   Acute symptomatic anemia, secondary to blood loss Hemoglobin on admission 6.1 Baseline hgb 14 (2011) no overt signs of bleeding here in hospital (heme-negative stool) or prior to admission(no melena, no bright red blood per stool) .  Iron profile consistent with iron deficiency anemia.  Likely exacerbated by  Xarelto as symptoms of dyspnea on exertion fit timeline of starting this new medication.  Patient received 2 units packed red blood cells on 2/6.  Hemoglobin remained stable throughout observation and on discharge day was 8.  After long discussion on stroke risk off anticoagulation patient and his wife clearly preferred to discontinue Xarelto continue with daily 81 mg aspirin knowing the risk for stroke given his chadsvasc score.  Paroxysmal atrial fibrillation, remained rate controlled CHADSVASC5. Previously on Eliquis but did not tolerate because of weakness.  Patient understands increased risk for stroke with discontinuation of Xarelto would like to do aspirin now in light of profound anemia and associated symptoms.   Consultations: None   Procedures/Studies:  No results found.   Discharge Exam: BP 122/72 (BP Location: Right Arm)   Pulse 71   Temp 98.2 F (36.8 C) (Oral)   Resp 18   Wt 73 kg (160 lb 15 oz)   SpO2 100%   BMI 24.47 kg/m   General- sitting up in bed, no distress, pleasant in conversation Cardiovascular- regular rate and rhythm, systolic ejection murmur 4/6 loudest at left upper sternal border Gastrointestinal-soft, nontender, nondistended, normal bowel sounds Neuro-alert and oriented x4, moving all extremities with no appreciable focal deficits Skin- no bruising   Discharge Instructions You were cared for by a hospitalist during your hospital stay. If you have any questions about your discharge medications or the care you received while you were in the hospital after you are discharged, you can call the unit and asked to speak with the hospitalist on call if the hospitalist that took care of you is not  available. Once you are discharged, your primary care physician will handle any further medical issues. Please note that NO REFILLS for any discharge medications will be authorized once you are discharged, as it is imperative that you return to your primary care  physician (or establish a relationship with a primary care physician if you do not have one) for your aftercare needs so that they can reassess your need for medications and monitor your lab values.  Discharge Instructions    Diet - low sodium heart healthy   Complete by:  As directed    Increase activity slowly   Complete by:  As directed      Allergies as of 01/17/2018      Reactions   Bee Venom Nausea Only, Other (See Comments)   Wasp and yellow jackets      Medication List    TAKE these medications   ASMANEX 60 METERED DOSES 220 MCG/INH inhaler Generic drug:  mometasone Inhale 1 Inhaler into the lungs daily.   aspirin EC 81 MG tablet Take 1 tablet (81 mg total) by mouth daily.   cholecalciferol 1000 units tablet Commonly known as:  VITAMIN D Take 1,000 Units by mouth daily as needed.   co-enzyme Q-10 30 MG capsule Take 200 mg by mouth daily.   [facility] GLUCOSAMINE 1500 MG Tabs Generic drug:  Glucosamine HCl Take 2 tablets by mouth daily.   EPINEPHrine 0.3 mg/0.3 mL Soaj injection Commonly known as:  EPI-PEN Inject 0.3 mg into the muscle once.   ezetimibe-simvastatin 10-20 MG tablet Commonly known as:  VYTORIN Take 1 tablet by mouth daily.   gabapentin 300 MG capsule Commonly known as:  NEURONTIN Take 300 mg by mouth 2 (two) times daily.   glucosamine-chondroitin 500-400 MG tablet Take 3 tablets by mouth 2 (two) times daily.   levothyroxine 112 MCG tablet Commonly known as:  SYNTHROID, LEVOTHROID Take 100 mcg by mouth daily.   lisinopril-hydrochlorothiazide 20-25 MG tablet Commonly known as:  PRINZIDE,ZESTORETIC Take 1 tablet by mouth daily.   vitamin B-12 1000 MCG tablet Commonly known as:  CYANOCOBALAMIN Take 1,000 mcg by mouth daily.      Allergies  Allergen Reactions  . Bee Venom Nausea Only and Other (See Comments)    Wasp and yellow jackets      The results of significant diagnostics from this hospitalization (including imaging, microbiology,  ancillary and laboratory) are listed below for reference.    Significant Diagnostic Studies: No results found.  Microbiology: No results found for this or any previous visit (from the past 240 hour(s)).   Labs: Basic Metabolic Panel: Recent Labs  Lab 01/13/18 1213 01/15/18 0944 01/16/18 0604  NA 142 138 135  K 4.6 4.4 3.9  CL 101 105 102  CO2 20 21* 21*  GLUCOSE 77 123* 99  BUN 25 22* 17  CREATININE 1.61* 1.32* 1.29*  CALCIUM 9.3 9.0 8.8*   Liver Function Tests: Recent Labs  Lab 01/15/18 0944  AST 30  ALT 20  ALKPHOS 55  BILITOT 0.4  PROT 6.6  ALBUMIN 3.6   No results for input(s): LIPASE, AMYLASE in the last 168 hours. No results for input(s): AMMONIA in the last 168 hours. CBC: Recent Labs  Lab 01/13/18 1213 01/15/18 0944 01/15/18 2045 01/16/18 0604 01/17/18 0614  WBC 8.2 6.1  --  7.2 6.9  HGB 6.2* 6.2* 7.6* 7.5* 8.0*  HCT 23.2* 23.3* 26.4* 25.7* 27.6*  MCV 66* 65.8*  --  68.9* 70.4*  PLT 388* 350  --  302 306   Cardiac Enzymes: No results for input(s): CKTOTAL, CKMB, CKMBINDEX, TROPONINI in the last 168 hours. BNP: BNP (last 3 results) No results for input(s): BNP in the last 8760 hours.  ProBNP (last 3 results) No results for input(s): PROBNP in the last 8760 hours.  CBG: No results for input(s): GLUCAP in the last 168 hours.     Signed:  Desiree Hane, MD [facility] 01/17/2018, 11:03 AM

## 2018-01-20 ENCOUNTER — Ambulatory Visit (INDEPENDENT_AMBULATORY_CARE_PROVIDER_SITE_OTHER): Payer: Medicare Other | Admitting: *Deleted

## 2018-01-20 DIAGNOSIS — I639 Cerebral infarction, unspecified: Secondary | ICD-10-CM | POA: Diagnosis not present

## 2018-01-21 ENCOUNTER — Telehealth: Payer: Self-pay | Admitting: Physician Assistant

## 2018-01-21 ENCOUNTER — Telehealth: Payer: Self-pay | Admitting: Cardiovascular Disease

## 2018-01-21 NOTE — Telephone Encounter (Signed)
Patient calling, states that his hemoglobin was at 6.1 and he was sent to [facility] hospital and he was discharged. Patient states his hemoglobin was at 8 when he left the hospital, patient believes that his hemoglobin levels should be higher and is concerned

## 2018-01-21 NOTE — Telephone Encounter (Signed)
Returned call to patient, patient states he was discharged from the hospital on 2/8 and was suppose to follow up with cardiology in 1-2 weeks.  States he is concerned and would like to schedule appt to follow up and discuss his recent admission.    appt scheduled with Rosaria Ferries PA Thursday 2/14 at 11AM.   Patient aware and verbalized understanding.

## 2018-01-21 NOTE — Progress Notes (Signed)
Carelink Summary Report / Loop Recorder 

## 2018-01-21 NOTE — Telephone Encounter (Signed)
Close encounter 

## 2018-01-23 ENCOUNTER — Ambulatory Visit: Payer: Medicare Other | Admitting: Physician Assistant

## 2018-01-23 ENCOUNTER — Encounter: Payer: Self-pay | Admitting: Physician Assistant

## 2018-01-23 VITALS — BP 118/68 | HR 96 | Ht 68.0 in | Wt 163.0 lb

## 2018-01-23 DIAGNOSIS — R0609 Other forms of dyspnea: Secondary | ICD-10-CM | POA: Diagnosis not present

## 2018-01-23 DIAGNOSIS — D649 Anemia, unspecified: Secondary | ICD-10-CM | POA: Insufficient documentation

## 2018-01-23 DIAGNOSIS — I350 Nonrheumatic aortic (valve) stenosis: Secondary | ICD-10-CM | POA: Diagnosis not present

## 2018-01-23 DIAGNOSIS — D509 Iron deficiency anemia, unspecified: Secondary | ICD-10-CM

## 2018-01-23 DIAGNOSIS — R0600 Dyspnea, unspecified: Secondary | ICD-10-CM

## 2018-01-23 NOTE — Progress Notes (Signed)
Cardiology Office Note   Date:  01/23/2018   ID:  Roy David, DOB [birth year], MRN [id number]  PCP:  [name], L.Marlou Sa, MD  Cardiologist: Dr. Sallyanne Kuster, 09/03/2017 Rosaria Ferries, PA-C 01/13/2018  Chief Complaint  Patient presents with  . Follow-up    History of Present Illness: Roy David is a 84 y.o. male with a history of central retinal artery occlusion>>ILR>>PAF, HTN, HLD, Mod AS,  CHA2DS2/VAS Stroke Risk Score 4 (HTN, Age x 2, stroke), shingles.   01/13/2018 office visit, patient reporting some dyspnea on exertion, no significant volume overload on exam although weight was up 4 pounds, CBC and BMET checked and hemoglobin was 6.2>>hospital admit, patient and wife discussed the situation with the admitting MD and will no longer be anticoagulated, take aspirin 81 mg only  Roy David presents for cardiology follow up.  He does not know why his H&H dropped, no dark or tarry stools.  Stool was guaiac negative in the hospital.  His INR was > 10 on admission, H&H 6.2/23.2. He was bleeding when he scratched himself, or his skin would rub on the sheets.  He would wake up with blood spots on the sheets, but did not think he was losing that much blood..   Since d/c from the hospital, off the [place], he has had no bleeding issues.   He is breathing better, can walk around and do more. He has not been stopping to rest as much. He still likes to stay busy. No CP w/ exertion.  No LE edema, no orthopnea or PND, nocturia 1-2 x night.   Dr [name] follows his labs, his cholesterol has been good.    Past Medical History:  Diagnosis Date  . Arthritis   . Asthma    followed at [facility], stable on Asmanex  . Diverticulosis   . H/O seasonal allergies    Dr [name]  . Hypercholesteremia   . Hypertension   . Hypochromic-microcytic anemia   . Microcytic anemia   . Paroxysmal atrial fibrillation ([place]) 03/06/2016   on Xarelto  . Prostate cancer [facility])    12/10- Dr Dahlstedt/Dr Valere Dross  .  Thyroid disease    hypothyroidism    Past Surgical History:  Procedure Laterality Date  . APPENDECTOMY    . EP IMPLANTABLE DEVICE N/A 11/29/2015   Procedure: Loop Recorder Insertion;  Surgeon: Sanda Klein, MD;  Location: [facility];  Service: Cardiovascular;  Laterality: N/A;  . HERNIA REPAIR    . RETINAL DETACHMENT SURGERY      Current Outpatient Medications  Medication Sig Dispense Refill  . aspirin EC 81 MG tablet Take 1 tablet (81 mg total) by mouth daily. 150 tablet 2  . cholecalciferol (VITAMIN D) 1000 units tablet Take 1,000 Units by mouth daily as needed.    Marland Kitchen co-enzyme Q-10 30 MG capsule Take 200 mg by mouth daily.    Marland Kitchen EPINEPHrine 0.3 mg/0.3 mL IJ SOAJ injection Inject 0.3 mg into the muscle once.    . ezetimibe-simvastatin (VYTORIN) 10-20 MG per tablet Take 1 tablet by mouth daily.    Marland Kitchen gabapentin (NEURONTIN) 300 MG capsule Take 300 mg by mouth 2 (two) times daily.     . Glucosamine HCl ([employer]S GLUCOSAMINE) 1500 MG TABS Take 2 tablets by mouth daily.    Marland Kitchen glucosamine-chondroitin 500-400 MG tablet Take 3 tablets by mouth 2 (two) times daily.    Marland Kitchen levothyroxine (SYNTHROID, LEVOTHROID) 112 MCG tablet Take 100 mcg by mouth daily.     Marland Kitchen lisinopril-hydrochlorothiazide (  PRINZIDE,ZESTORETIC) 20-25 MG tablet Take 1 tablet by mouth daily.    . mometasone (ASMANEX 60 METERED DOSES) 220 MCG/INH inhaler Inhale 1 Inhaler into the lungs daily.    . vitamin B-12 (CYANOCOBALAMIN) 1000 MCG tablet Take 1,000 mcg by mouth daily.     No current facility-administered medications for this visit.     Allergies:   Bee venom    Social History:  The patient  reports that he quit smoking about 58 years ago. he has never used smokeless tobacco. He reports that he drinks about 8.4 oz of alcohol per week. He reports that he does not use drugs.   Family History:  The patient's family history includes Heart attack in his mother; Stroke in his father and sister.    ROS:  Please see the  history of present illness. All other systems are reviewed and negative.    PHYSICAL EXAM: VS:  BP 118/68   Pulse 96   Ht 5\' 8"  (1.727 m)   Wt 163 lb (73.9 kg)   BMI 24.78 kg/m  , BMI Body mass index is 24.78 kg/m. GEN: Well nourished, well developed, male in no acute distress  HEENT: normal for age  Neck: no JVD, no carotid bruit but murmur radiates to both carotids, no masses Cardiac: RRR; 2-3/6 murmur, no rubs, or gallops Respiratory:  clear to auscultation bilaterally, normal work of breathing GI: soft, nontender, nondistended, + BS MS: no deformity or atrophy; no edema; distal pulses are 2+ in all 4 extremities   Skin: warm and dry, no rash Neuro:  Strength and sensation are intact Psych: euthymic mood, full affect   EKG:  EKG is not ordered today.  Echocardiogram 10/01/2014 Study Conclusions - Left ventricle: The cavity size was normal. There was mild focal basal hypertrophy of the septum. Systolic function was normal. The estimated ejection fraction was in the range of 55% to 60%. Wall motion was normal; there were no regional wall motion abnormalities. Doppler parameters are consistent with abnormal left ventricular relaxation (grade 1 diastolic dysfunction). Doppler parameters are consistent with high ventricular filling pressure. - Aortic valve: Cusp separation was reduced. There was moderate stenosis. There was mild regurgitation. Mean gradient (S): 20 mm Hg. Valve area (Vmax): 1.17 cm^2. - Mitral valve: Moderately calcified annulus. - Left atrium: The atrium was mildly dilated. - Right ventricle: The cavity size was mildly dilated. Wall thickness was normal.     Recent Labs: 01/15/2018: ALT 20; TSH 3.866 01/16/2018: BUN 17; Creatinine, Ser 1.29; Potassium 3.9; Sodium 135 01/17/2018: Hemoglobin 8.0; Platelets 306    Lipid Panel No results found for: CHOL, TRIG, HDL, CHOLHDL, VLDL, LDLCALC, LDLDIRECT   Wt Readings from Last 3 Encounters:    01/23/18 163 lb (73.9 kg)  01/16/18 160 lb 15 oz (73 kg)  01/13/18 166 lb (75.3 kg)     Other studies Reviewed: Additional studies/ records that were reviewed today include: Office notes, hospital records and testing.  ASSESSMENT AND PLAN:  1.  Dyspnea on exertion: His symptoms have improved some since his hemoglobin has improved, but are still present.  His aortic stenosis may have progressed, get the echo as previously ordered.  I explained briefly the concept of the TAVR clinic and explained that I felt he would be a candidate once he was symptomatic from the aortic stenosis or his valve got severe enough to need it.  They understand that being referred to the TAVR clinic does not mean that he will have to have bypass  surgery.  He is requested to look for things like orthostatic dizziness, increasing dyspnea on exertion or chest pain.  He has not experienced any of those.  On exam, he has a distinct S2.  2.  Anemia: His iron level was normal at 52 although his ferritin level was extremely low.  I will go ahead and recheck a CBC today, but he is encouraged to follow-up with his PCP for further evaluation and management of this.  3.  Anticoagulation: Although his iron level was normal, other labs were abnormal and his hemoglobin was extremely low.  He is unwilling to consider anticoagulation at this time except with aspirin 81 mg daily.  I advised that should he have another CVA, we would revisit this.   Current medicines are reviewed at length with the patient today.  The patient does not have concerns regarding medicines.  The following changes have been made:  no change  Labs/ tests ordered today include:   Orders Placed This Encounter  Procedures  . CBC     Disposition:   FU with Dr. Sallyanne Kuster  Signed, Rosaria Ferries, PA-C  01/23/2018 5:53 PM    [place] Phone: [phone]; Fax: [phone]  This note was written with the assistance of  speech recognition software. Please excuse any transcriptional errors.

## 2018-01-23 NOTE — Patient Instructions (Signed)
Medication Instructions:  Your physician recommends that you continue on your current medications as directed. Please refer to the Current Medication list given to you today.  Labwork: Your physician recommends that you return for lab work in: [facility]  Testing/Procedures: [place] already in chart   Follow-Up: Your physician recommends that you schedule a follow-up appointment in: after ECHO with DR CROITORU.  Any Other Special Instructions Will Be Listed Below (If Applicable). If you need a refill on your cardiac medications before your next appointment, please call your pharmacy.

## 2018-01-24 LAB — CBC
Hematocrit: 31.9 % — ABNORMAL LOW (ref 37.5–51.0)
Hemoglobin: 9.1 g/dL — ABNORMAL LOW (ref 13.0–17.7)
MCH: 20.3 pg — ABNORMAL LOW (ref 26.6–33.0)
MCHC: 28.5 g/dL — ABNORMAL LOW (ref 31.5–35.7)
MCV: 71 fL — AB (ref 79–97)
PLATELETS: 368 10*3/uL (ref 150–379)
RBC: 4.49 x10E6/uL (ref 4.14–5.80)
RDW: 23.8 % — AB (ref 12.3–15.4)
WBC: 8.4 10*3/uL (ref 3.4–10.8)

## 2018-02-03 ENCOUNTER — Ambulatory Visit (HOSPITAL_COMMUNITY): Payer: Medicare Other | Attending: Cardiovascular Disease

## 2018-02-03 ENCOUNTER — Other Ambulatory Visit: Payer: Self-pay

## 2018-02-03 DIAGNOSIS — I480 Paroxysmal atrial fibrillation: Secondary | ICD-10-CM | POA: Insufficient documentation

## 2018-02-03 DIAGNOSIS — Z8673 Personal history of transient ischemic attack (TIA), and cerebral infarction without residual deficits: Secondary | ICD-10-CM | POA: Insufficient documentation

## 2018-02-03 DIAGNOSIS — E785 Hyperlipidemia, unspecified: Secondary | ICD-10-CM | POA: Diagnosis not present

## 2018-02-03 DIAGNOSIS — I119 Hypertensive heart disease without heart failure: Secondary | ICD-10-CM | POA: Diagnosis not present

## 2018-02-03 DIAGNOSIS — I77810 Thoracic aortic ectasia: Secondary | ICD-10-CM | POA: Diagnosis not present

## 2018-02-03 DIAGNOSIS — I352 Nonrheumatic aortic (valve) stenosis with insufficiency: Secondary | ICD-10-CM | POA: Diagnosis not present

## 2018-02-03 DIAGNOSIS — I350 Nonrheumatic aortic (valve) stenosis: Secondary | ICD-10-CM

## 2018-02-18 LAB — CUP PACEART REMOTE DEVICE CHECK
Date Time Interrogation Session: 20190209193743
MDC IDC PG IMPLANT DT: 20161220

## 2018-02-20 ENCOUNTER — Ambulatory Visit (INDEPENDENT_AMBULATORY_CARE_PROVIDER_SITE_OTHER): Payer: Medicare Other | Admitting: *Deleted

## 2018-02-20 DIAGNOSIS — I639 Cerebral infarction, unspecified: Secondary | ICD-10-CM | POA: Diagnosis not present

## 2018-02-21 DIAGNOSIS — D649 Anemia, unspecified: Secondary | ICD-10-CM | POA: Diagnosis not present

## 2018-02-21 DIAGNOSIS — I350 Nonrheumatic aortic (valve) stenosis: Secondary | ICD-10-CM | POA: Diagnosis not present

## 2018-02-21 DIAGNOSIS — I480 Paroxysmal atrial fibrillation: Secondary | ICD-10-CM | POA: Diagnosis not present

## 2018-02-21 NOTE — Progress Notes (Signed)
Carelink Summary Report / Loop Recorder 

## 2018-03-04 ENCOUNTER — Ambulatory Visit: Payer: Medicare Other | Admitting: Cardiovascular Disease

## 2018-03-04 ENCOUNTER — Encounter: Payer: Self-pay | Admitting: Cardiovascular Disease

## 2018-03-04 VITALS — BP 132/66 | HR 72 | Ht 68.5 in | Wt 163.0 lb

## 2018-03-04 DIAGNOSIS — I10 Essential (primary) hypertension: Secondary | ICD-10-CM

## 2018-03-04 DIAGNOSIS — I350 Nonrheumatic aortic (valve) stenosis: Secondary | ICD-10-CM

## 2018-03-04 DIAGNOSIS — E7800 Pure hypercholesterolemia, unspecified: Secondary | ICD-10-CM

## 2018-03-04 DIAGNOSIS — Z95818 Presence of other cardiac implants and grafts: Secondary | ICD-10-CM

## 2018-03-04 DIAGNOSIS — I480 Paroxysmal atrial fibrillation: Secondary | ICD-10-CM

## 2018-03-04 NOTE — Progress Notes (Signed)
Patient ID: Roy David, male   DOB: 13-Apr-1930, 84 y.o.   MRN: [id number]    Cardiology Office Note    Date:  03/05/2018   ID:  Roy David, DOB [birth year], MRN [id number]  PCP:  [name], L.Marlou Sa, MD  Cardiologist:   Sanda Klein, MD   No chief complaint on file.   History of Present Illness:  Roy David is a 84 y.o. 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   Unfortunately, he developed severe symptomatic anemia after starting anticoagulation.  His hemoglobin was as low as 6, with microcytic indices.  Xarelto was stopped.  He received a transfusion.  Stool samples were negative for occult bleeding.  He had colonoscopy about 3 years ago and had a couple of benign polyps removed at that time.  He has no symptoms of dyspepsia or acid reflux.  Has not had obvious melena or hematochezia.  He eats a varied diet.  Repeat hemoglobin testing on February 14 and again on March 15 shows no improvement in hemoglobin which is gone from 9.4 to 9.1.  He still has evidence of iron deficiency with a ferritin of 7 and microcytic indices.  He has only now recently started an iron supplement.  He did not take one for the past month.  He has more energy and less dyspnea.  The patient specifically denies any chest pain at rest exertion, dyspnea at rest or with exertion, orthopnea, paroxysmal nocturnal dyspnea, syncope, palpitations, focal neurological deficits, intermittent claudication, lower extremity edema, unexplained weight gain, cough, hemoptysis or wheezing.  Still has not noticed any bleeding in his stool or melena.  He does complain that the generic ezetimibe-simvastatin causes diarrhea, but he did not have this problem with Vytorin, brand name.  Diarrhea is not dark colored.  Previous problems with  easy bruising and bleeding have resolved when he stopped the Xarelto.  Additional problems include hypertension, hypercholesterolemia and moderate aortic valve stenosis.   Past Medical History:  Diagnosis Date  . Arthritis   . Asthma    [facility]ed at [facility], stable on Asmanex  . Diverticulosis   . H/O seasonal allergies    Dr [name]  . Hypercholesteremia   . Hypertension   . Hypochromic-microcytic anemia   . Microcytic anemia   . Paroxysmal atrial fibrillation ([place]) 03/06/2016   on Xarelto  . Prostate cancer [facility])    12/10- Dr Dahlstedt/Dr Valere Dross  . Thyroid disease    hypothyroidism    Past Surgical History:  Procedure Laterality Date  . APPENDECTOMY    . EP IMPLANTABLE DEVICE N/A 11/29/2015   Procedure: Loop Recorder Insertion;  Surgeon: Sanda Klein, MD;  Location: [place] [facility];  Service: Cardiovascular;  Laterality: N/A;  . HERNIA REPAIR    . RETINAL DETACHMENT SURGERY      Outpatient Medications Prior to Visit  Medication Sig Dispense Refill  . aspirin EC 81 MG tablet Take 1 tablet (81 mg total) by mouth daily. 150 tablet 2  . cholecalciferol (VITAMIN D) 1000 units tablet Take 1,000 Units by mouth daily as needed.    Marland Kitchen co-enzyme Q-10 30 MG capsule Take 200 mg by mouth daily.    Marland Kitchen EPINEPHrine 0.3 mg/0.3 mL IJ SOAJ injection Inject 0.3 mg into the muscle once.    . ezetimibe-simvastatin (VYTORIN) 10-20 MG per tablet Take 1 tablet by mouth  daily.    . ferrous sulfate 325 (65 FE) MG tablet Take 325 mg by mouth daily with breakfast.    . gabapentin (NEURONTIN) 300 MG capsule Take 300 mg by mouth 2 (two) times daily.     . Glucosamine HCl ([employer] GLUCOSAMINE) 1500 MG TABS Take 2 tablets by mouth daily.    . Glucosamine-Chondroitin 1500-1200 MG/30ML LIQD Take 1 tablet by mouth 2 (two) times daily.     Marland Kitchen levothyroxine (SYNTHROID, LEVOTHROID) 100 MCG tablet Take 100 mcg by mouth daily.     Marland Kitchen lisinopril-hydrochlorothiazide (PRINZIDE,ZESTORETIC) 20-25 MG tablet Take 1 tablet by  mouth daily.    . mometasone (ASMANEX 60 METERED DOSES) 220 MCG/INH inhaler Inhale 1 Inhaler into the lungs daily.    . vitamin B-12 (CYANOCOBALAMIN) 1000 MCG tablet Take 1,000 mcg by mouth daily.     No facility-administered medications prior to visit.      Allergies:   Bee venom   Social History   Socioeconomic History  . Marital status: Married    Spouse name: Not on file  . Number of children: Not on file  . Years of education: Not on file  . Highest education level: Not on file  Occupational History  . Occupation: retired  Scientific laboratory technician  . Financial resource strain: Not on file  . Food insecurity:    Worry: Not on file    Inability: Not on file  . Transportation needs:    Medical: Not on file    Non-medical: Not on file  Tobacco Use  . Smoking status: Former Smoker    Last attempt to quit: 12/09/1959    Years since quitting: 58.2  . Smokeless tobacco: Never Used  Substance and Sexual Activity  . Alcohol use: Yes    Alcohol/week: 8.4 oz    Types: 7 Shots of liquor, 7 Cans of beer per week  . Drug use: No  . Sexual activity: Not on file  Lifestyle  . Physical activity:    Days per week: Not on file    Minutes per session: Not on file  . Stress: Not on file  Relationships  . Social connections:    Talks on phone: Not on file    Gets together: Not on file    Attends religious service: Not on file    Active member of club or organization: Not on file    Attends meetings of clubs or organizations: Not on file    Relationship status: Not on file  Other Topics Concern  . Not on file  Social History Narrative   Tobacco use cigarettes : Former smoker, Quit in year 1955   Occupation : retired/  married     Family History:  The patient's family history includes Heart attack in his mother; Stroke in his father and sister.   ROS:   Please see the history of present illness.    ROS All other systems reviewed and are negative.   PHYSICAL EXAM:   VS:  BP 132/66    Pulse 72   Ht 5' 8.5" (1.74 m)   Wt 163 lb (73.9 kg)   BMI 24.42 kg/m      General: Alert, oriented x3, no distress, only minimally pale Head: no evidence of trauma, PERRL, EOMI, no exophtalmos or lid lag, no myxedema, no xanthelasma; normal ears, nose and oropharynx Neck: normal jugular venous pulsations and no hepatojugular reflux; brisk carotid pulses without delay and no carotid bruits Chest: clear to auscultation, no signs of consolidation  by percussion or palpation, normal fremitus, symmetrical and full respiratory excursions Cardiovascular: normal position and quality of the apical impulse, regular rhythm, normal first and second heart sounds, 3/6 early to mid peaking systolic ejection murmur in the aortic focus radiating to the carotids, no diastolic murmurs, rubs or gallops Abdomen: no tenderness or distention, no masses by palpation, no abnormal pulsatility or arterial bruits, normal bowel sounds, no hepatosplenomegaly Extremities: no clubbing, cyanosis or edema; 2+ radial, ulnar and brachial pulses bilaterally; 2+ right femoral, posterior tibial and dorsalis pedis pulses; 2+ left femoral, posterior tibial and dorsalis pedis pulses; no subclavian or femoral bruits Neurological: grossly nonfocal Psych: Normal mood and affect    Wt Readings from Last 3 Encounters:  03/04/18 163 lb (73.9 kg)  01/23/18 163 lb (73.9 kg)  01/16/18 160 lb 15 oz (73 kg)      Studies/Labs Reviewed:   EKG:  EKG is ordered today.  It shows normal sinus rhythm, normal tracing  LABS creatinine 1.1, hemoglobin 14.1, potassium 4.4, normal LFTs, TSH 2.96 Total cholesterol 174, HDL 77, LDL 79, triglycerides 94    ASSESSMENT:    No diagnosis found.    PLAN:  In order of problems listed above:  1. PAFib: He continues to have a low burden of arrhythmia, but unfortunately has high embolic risk.  CHADSVasc 5 (age 50, "CVA"  2, HTN).  Would definitely keep him off the Xarelto until we clarify the  anemia because and see reversal in its severity.  If we do identify GI source of bleeding, once this seems to improve we might consider Eliquis with a lower GI bleeding profile.  I think he deserves further GI workup.  Although the Xarelto clearly may have worsened his tendency to anemia, it is not the actual cause of bleeding/iron deficiency.  Even without iron supplementation, I would have expected his iron deficiency to improve if bleeding had stopped.  Would defer to gastroenterology whether he needs another colonoscopy, upper endoscopy or capsule enteroscopy.  Since he has aortic stenosis, it is conceivable that he has arteriovenous malformations, which may not be treatable.  Antiarrhythmics and even rate control medications do not appear to be justified at this time.  2. AS: Had some symptoms when he was more anemic, but is now asymptomatic.  Aortic stenosis is only moderate. 3. HTN: Well-controlled 4. HLP: On statin with LDL at target 5. ILR: Normal device function.  Well-healed site  Medication Adjustments/Labs and Tests Ordered: Current medicines are reviewed at length with the patient today.  Concerns regarding medicines are outlined above.  Medication changes, Labs and Tests ordered today are listed in the Patient Instructions below. Patient Instructions  Dr Sallyanne Kuster recommends that you schedule a follow-up appointment in 6 months. You will receive a reminder letter in the mail two months in advance. If you don't receive a letter, please call our office to schedule the follow-up appointment.  If you need a refill on your cardiac medications before your next appointment, please call your pharmacy.      Signed, Sanda Klein, MD  03/05/2018 5:52 PM    [address], [place], [place]  [address] Phone: [phone]; Fax: [phone]

## 2018-03-04 NOTE — Patient Instructions (Signed)
Dr Croitoru recommends that you schedule a follow-up appointment in 6 months. You will receive a reminder letter in the mail two months in advance. If you don't receive a letter, please call our office to schedule the follow-up appointment.  If you need a refill on your cardiac medications before your next appointment, please call your pharmacy. 

## 2018-03-05 DIAGNOSIS — Z95818 Presence of other cardiac implants and grafts: Secondary | ICD-10-CM | POA: Insufficient documentation

## 2018-03-14 DIAGNOSIS — C61 Malignant neoplasm of prostate: Secondary | ICD-10-CM | POA: Diagnosis not present

## 2018-03-19 DIAGNOSIS — C61 Malignant neoplasm of prostate: Secondary | ICD-10-CM | POA: Diagnosis not present

## 2018-03-25 ENCOUNTER — Ambulatory Visit (INDEPENDENT_AMBULATORY_CARE_PROVIDER_SITE_OTHER): Payer: Medicare Other | Admitting: *Deleted

## 2018-03-25 DIAGNOSIS — I639 Cerebral infarction, unspecified: Secondary | ICD-10-CM | POA: Diagnosis not present

## 2018-03-26 NOTE — Progress Notes (Signed)
Carelink Summary Report / Loop Recorder 

## 2018-03-31 LAB — CUP PACEART REMOTE DEVICE CHECK
Date Time Interrogation Session: 20190314200542
MDC IDC PG IMPLANT DT: 20161220

## 2018-04-01 DIAGNOSIS — E7800 Pure hypercholesterolemia, unspecified: Secondary | ICD-10-CM | POA: Diagnosis not present

## 2018-04-01 DIAGNOSIS — I10 Essential (primary) hypertension: Secondary | ICD-10-CM | POA: Diagnosis not present

## 2018-04-01 DIAGNOSIS — E039 Hypothyroidism, unspecified: Secondary | ICD-10-CM | POA: Diagnosis not present

## 2018-04-01 DIAGNOSIS — M150 Primary generalized (osteo)arthritis: Secondary | ICD-10-CM | POA: Diagnosis not present

## 2018-04-15 DIAGNOSIS — B0229 Other postherpetic nervous system involvement: Secondary | ICD-10-CM | POA: Diagnosis not present

## 2018-04-15 DIAGNOSIS — D485 Neoplasm of uncertain behavior of skin: Secondary | ICD-10-CM | POA: Diagnosis not present

## 2018-04-15 DIAGNOSIS — L570 Actinic keratosis: Secondary | ICD-10-CM | POA: Diagnosis not present

## 2018-04-15 DIAGNOSIS — L821 Other seborrheic keratosis: Secondary | ICD-10-CM | POA: Diagnosis not present

## 2018-04-28 ENCOUNTER — Ambulatory Visit (INDEPENDENT_AMBULATORY_CARE_PROVIDER_SITE_OTHER): Payer: Medicare Other | Admitting: *Deleted

## 2018-04-28 DIAGNOSIS — I639 Cerebral infarction, unspecified: Secondary | ICD-10-CM

## 2018-04-28 LAB — CUP PACEART REMOTE DEVICE CHECK
Date Time Interrogation Session: 20190416204038
MDC IDC PG IMPLANT DT: 20161220

## 2018-04-28 NOTE — Progress Notes (Signed)
Carelink Summary Report / Loop Recorder 

## 2018-05-19 LAB — CUP PACEART REMOTE DEVICE CHECK
MDC IDC PG IMPLANT DT: 20161220
MDC IDC SESS DTM: 20190519214016

## 2018-05-30 ENCOUNTER — Encounter: Payer: Medicare Other | Admitting: *Deleted

## 2018-05-30 ENCOUNTER — Ambulatory Visit (INDEPENDENT_AMBULATORY_CARE_PROVIDER_SITE_OTHER): Payer: Medicare Other | Admitting: *Deleted

## 2018-05-30 DIAGNOSIS — I639 Cerebral infarction, unspecified: Secondary | ICD-10-CM

## 2018-06-02 NOTE — Progress Notes (Signed)
Carelink Summary Report / Loop Recorder 

## 2018-06-09 DIAGNOSIS — D649 Anemia, unspecified: Secondary | ICD-10-CM | POA: Diagnosis not present

## 2018-06-24 DIAGNOSIS — H52223 Regular astigmatism, bilateral: Secondary | ICD-10-CM | POA: Diagnosis not present

## 2018-07-02 ENCOUNTER — Ambulatory Visit (INDEPENDENT_AMBULATORY_CARE_PROVIDER_SITE_OTHER): Payer: Medicare Other | Admitting: *Deleted

## 2018-07-02 DIAGNOSIS — I639 Cerebral infarction, unspecified: Secondary | ICD-10-CM | POA: Diagnosis not present

## 2018-07-03 LAB — CUP PACEART REMOTE DEVICE CHECK
Implantable Pulse Generator Implant Date: 20161220
MDC IDC SESS DTM: 20190621221301

## 2018-07-03 NOTE — Progress Notes (Signed)
Carelink Summary Report / Loop Recorder 

## 2018-07-18 DIAGNOSIS — M170 Bilateral primary osteoarthritis of knee: Secondary | ICD-10-CM | POA: Diagnosis not present

## 2018-07-18 DIAGNOSIS — M1711 Unilateral primary osteoarthritis, right knee: Secondary | ICD-10-CM | POA: Diagnosis not present

## 2018-08-04 ENCOUNTER — Ambulatory Visit (INDEPENDENT_AMBULATORY_CARE_PROVIDER_SITE_OTHER): Payer: Medicare Other | Admitting: *Deleted

## 2018-08-04 DIAGNOSIS — I639 Cerebral infarction, unspecified: Secondary | ICD-10-CM | POA: Diagnosis not present

## 2018-08-05 NOTE — Progress Notes (Signed)
Carelink Summary Report / Loop Recorder 

## 2018-08-14 ENCOUNTER — Telehealth: Payer: Self-pay | Admitting: Cardiology

## 2018-08-14 DIAGNOSIS — J4530 Mild persistent asthma, uncomplicated: Secondary | ICD-10-CM | POA: Diagnosis not present

## 2018-08-14 NOTE — Telephone Encounter (Signed)
Spoke w/ pt wife and requested that he send a manual transmission b/c his home monitor has not updated in at least 14 days.   

## 2018-08-18 LAB — CUP PACEART REMOTE DEVICE CHECK
Date Time Interrogation Session: 20190724223830
MDC IDC PG IMPLANT DT: 20161220

## 2018-08-29 DIAGNOSIS — Z23 Encounter for immunization: Secondary | ICD-10-CM | POA: Diagnosis not present

## 2018-08-29 DIAGNOSIS — M1712 Unilateral primary osteoarthritis, left knee: Secondary | ICD-10-CM | POA: Diagnosis not present

## 2018-09-02 LAB — CUP PACEART REMOTE DEVICE CHECK
Implantable Pulse Generator Implant Date: 20161220
MDC IDC SESS DTM: 20190826223559

## 2018-09-05 DIAGNOSIS — M1712 Unilateral primary osteoarthritis, left knee: Secondary | ICD-10-CM | POA: Diagnosis not present

## 2018-09-08 ENCOUNTER — Ambulatory Visit (INDEPENDENT_AMBULATORY_CARE_PROVIDER_SITE_OTHER): Payer: Medicare Other | Admitting: *Deleted

## 2018-09-08 DIAGNOSIS — I639 Cerebral infarction, unspecified: Secondary | ICD-10-CM

## 2018-09-09 LAB — CUP PACEART REMOTE DEVICE CHECK
Date Time Interrogation Session: 20190928233832
MDC IDC PG IMPLANT DT: 20161220

## 2018-09-09 NOTE — Progress Notes (Signed)
Carelink Summary Report / Loop Recorder 

## 2018-09-12 ENCOUNTER — Telehealth: Payer: Self-pay | Admitting: *Deleted

## 2018-09-12 DIAGNOSIS — M1712 Unilateral primary osteoarthritis, left knee: Secondary | ICD-10-CM | POA: Diagnosis not present

## 2018-09-12 DIAGNOSIS — I480 Paroxysmal atrial fibrillation: Secondary | ICD-10-CM

## 2018-09-12 NOTE — Telephone Encounter (Signed)
  Spoke with patient regarding increased "AF burden" noted on LINQ trends in recent days. Patient reports he has been completely asymptomatic and doing well. Some available ECGs appear sinus w/PACs and nonsustained AT, others more suggestive of AF (see example ECGs and trends below). Advised that I will make Dr. Sallyanne Kuster aware, and that I will call back if any recommendations. Patient is agreeable to this plan and appreciative of call.

## 2018-09-13 NOTE — Telephone Encounter (Signed)
Can we please get him to check CMET and MG (unless done in the last 3-4 weeks elsewhere) [facility]

## 2018-09-15 ENCOUNTER — Other Ambulatory Visit: Payer: Medicare Other | Admitting: *Deleted

## 2018-09-15 DIAGNOSIS — I480 Paroxysmal atrial fibrillation: Secondary | ICD-10-CM

## 2018-09-15 LAB — COMPREHENSIVE METABOLIC PANEL
ALBUMIN: 3.7 g/dL (ref 3.5–5.0)
ALT: 36 U/L (ref 0–44)
AST: 49 U/L — AB (ref 15–41)
Alkaline Phosphatase: 70 U/L (ref 38–126)
Anion gap: 10 (ref 5–15)
BILIRUBIN TOTAL: 0.7 mg/dL (ref 0.3–1.2)
BUN: 21 mg/dL (ref 8–23)
CHLORIDE: 97 mmol/L — AB (ref 98–111)
CO2: 27 mmol/L (ref 22–32)
Calcium: 9.6 mg/dL (ref 8.9–10.3)
Creatinine, Ser: 1.36 mg/dL — ABNORMAL HIGH (ref 0.61–1.24)
GFR calc Af Amer: 53 mL/min — ABNORMAL LOW (ref >60)
GFR calc non Af Amer: 45 mL/min — ABNORMAL LOW (ref >60)
GLUCOSE: 127 mg/dL — AB (ref 70–99)
POTASSIUM: 4 mmol/L (ref 3.5–5.1)
Sodium: 134 mmol/L — ABNORMAL LOW (ref 135–145)
Total Protein: 6.3 g/dL — ABNORMAL LOW (ref 6.5–8.1)

## 2018-09-15 LAB — MAGNESIUM: Magnesium: 1.8 mg/dL (ref 1.7–2.4)

## 2018-09-15 NOTE — Telephone Encounter (Signed)
Spoke with patient. He is agreeable to scheduling lab appointment for later today at [facility] office for CMET and Mg. He is aware of office address. Orders entered. Yearly f/u with Dr. [name] (PCP) is not for a few more weeks so he has not had recent lab work.  Patient also requested to schedule f/u with Dr. Sallyanne Kuster per recall. He is agreeable to appointment on 10/15/18 at 9:40am at the [facility] office. Patient is appreciative of call and denies additional questions or concerns at this time.

## 2018-10-02 DIAGNOSIS — E039 Hypothyroidism, unspecified: Secondary | ICD-10-CM | POA: Diagnosis not present

## 2018-10-02 DIAGNOSIS — E7800 Pure hypercholesterolemia, unspecified: Secondary | ICD-10-CM | POA: Diagnosis not present

## 2018-10-02 DIAGNOSIS — I10 Essential (primary) hypertension: Secondary | ICD-10-CM | POA: Diagnosis not present

## 2018-10-02 DIAGNOSIS — M150 Primary generalized (osteo)arthritis: Secondary | ICD-10-CM | POA: Diagnosis not present

## 2018-10-09 ENCOUNTER — Ambulatory Visit (INDEPENDENT_AMBULATORY_CARE_PROVIDER_SITE_OTHER): Payer: Medicare Other | Admitting: *Deleted

## 2018-10-09 DIAGNOSIS — I639 Cerebral infarction, unspecified: Secondary | ICD-10-CM

## 2018-10-09 DIAGNOSIS — I10 Essential (primary) hypertension: Secondary | ICD-10-CM

## 2018-10-10 NOTE — Progress Notes (Signed)
Carelink Summary Report / Loop Recorder 

## 2018-10-15 ENCOUNTER — Ambulatory Visit (INDEPENDENT_AMBULATORY_CARE_PROVIDER_SITE_OTHER): Payer: Medicare Other | Admitting: Cardiovascular Disease

## 2018-10-15 ENCOUNTER — Encounter: Payer: Self-pay | Admitting: Cardiovascular Disease

## 2018-10-15 VITALS — BP 132/76 | HR 71 | Ht 68.5 in | Wt 165.3 lb

## 2018-10-15 DIAGNOSIS — I480 Paroxysmal atrial fibrillation: Secondary | ICD-10-CM | POA: Diagnosis not present

## 2018-10-15 DIAGNOSIS — I350 Nonrheumatic aortic (valve) stenosis: Secondary | ICD-10-CM

## 2018-10-15 DIAGNOSIS — Z9889 Other specified postprocedural states: Secondary | ICD-10-CM

## 2018-10-15 DIAGNOSIS — I10 Essential (primary) hypertension: Secondary | ICD-10-CM

## 2018-10-15 DIAGNOSIS — Z79899 Other long term (current) drug therapy: Secondary | ICD-10-CM

## 2018-10-15 DIAGNOSIS — E7800 Pure hypercholesterolemia, unspecified: Secondary | ICD-10-CM

## 2018-10-15 DIAGNOSIS — D500 Iron deficiency anemia secondary to blood loss (chronic): Secondary | ICD-10-CM

## 2018-10-15 DIAGNOSIS — D649 Anemia, unspecified: Secondary | ICD-10-CM | POA: Diagnosis not present

## 2018-10-15 LAB — CUP PACEART INCLINIC DEVICE CHECK
MDC IDC PG IMPLANT DT: 20161220
MDC IDC SESS DTM: 20191106140246

## 2018-10-15 LAB — CBC
HEMOGLOBIN: 14.6 g/dL (ref 13.0–17.7)
Hematocrit: 42.9 % (ref 37.5–51.0)
MCH: 32.4 pg (ref 26.6–33.0)
MCHC: 34 g/dL (ref 31.5–35.7)
MCV: 95 fL (ref 79–97)
Platelets: 296 10*3/uL (ref 150–450)
RBC: 4.51 x10E6/uL (ref 4.14–5.80)
RDW: 11.9 % — ABNORMAL LOW (ref 12.3–15.4)
WBC: 9 10*3/uL (ref 3.4–10.8)

## 2018-10-15 NOTE — Progress Notes (Addendum)
Patient ID: Roy David, male   DOB: [birth year], 84 y.o.   MRN: [id number]    Cardiology Office Note    Date:  10/17/2018   ID:  Roy David, DOB [birth year], MRN [id number]  PCP:  [name], L.Marlou Sa, MD  Cardiologist:   Sanda Klein, MD   Chief Complaint  Patient presents with  . Atrial Fibrillation    History of Present Illness:  Roy David is a 84 y.o. 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   Unfortunately, he developed severe symptomatic anemia after starting anticoagulation.  His hemoglobin was as low as 6, with microcytic indices.  Xarelto was stopped.  He received a transfusion.  Stool samples were negative for occult bleeding.  He had colonoscopy about 3 years ago and had a couple of benign polyps removed at that time.  He has no symptoms of dyspepsia or acid reflux.  Has not had obvious melena or hematochezia.  He eats a varied diet.  After discontinuation of his anticoagulant and starting iron supplements his hemoglobin has normalized.  In July his hemoglobin was only up to 12.8.  We rechecked it today and it was up to 14.6.  Erythrocyte indices are now normocytic normochromic.  Unfortunately, his loop recorder actually shows some increase in the overall burden of atrial fibrillation over the last couple of months.  Over the last year atrial fibrillation has occurred 0.9% of time, but there has been a unexpected delivery of atrial fibrillation throughout August and September.  The longest episode lasted for 3 hours and 38 minutes with a ventricular rate just under 100.  Some of the short episodes actually represent sinus rhythm with frequent PACs.  Some of them are faster in the 110-120 range, but no severe tachycardia seen.  All of these episodes are again asymptomatic.  The patient specifically  denies any chest pain at rest exertion, dyspnea at rest or with exertion, orthopnea, paroxysmal nocturnal dyspnea, syncope, palpitations, focal neurological deficits, intermittent claudication, lower extremity edema, unexplained weight gain, cough, hemoptysis or wheezing.  His stools are dark-colored because of the iron supplement and he has long-standing problems of irritable bowel syndrome.  He has not had frank melena or hematochezia.  Additional problems include hypertension, hypercholesterolemia and moderate aortic valve stenosis.   Past Medical History:  Diagnosis Date  . Arthritis   . Asthma    followed at [facility], stable on Asmanex  . Diverticulosis   . H/O seasonal allergies    Dr [name]  . Hypercholesteremia   . Hypertension   . Hypochromic-microcytic anemia   . Microcytic anemia   . Paroxysmal atrial fibrillation ([place]) 03/06/2016   on Xarelto  . Prostate cancer [facility])    12/10- Dr Dahlstedt/Dr Valere Dross  . Thyroid disease    hypothyroidism    Past Surgical History:  Procedure Laterality Date  . APPENDECTOMY    . EP IMPLANTABLE DEVICE N/A 11/29/2015   Procedure: Loop Recorder Insertion;  Surgeon: Sanda Klein, MD;  Location: [place] [facility];  Service: Cardiovascular;  Laterality: N/A;  . HERNIA REPAIR    . RETINAL DETACHMENT SURGERY      Outpatient Medications Prior to Visit  Medication Sig Dispense Refill  . cholecalciferol (VITAMIN D) 1000 units tablet Take 1,000 Units by mouth daily as needed.    Marland Kitchen co-enzyme Q-10 30 MG capsule Take 200 mg by  mouth daily.    Marland Kitchen EPINEPHrine 0.3 mg/0.3 mL IJ SOAJ injection Inject 0.3 mg into the muscle once.    . ezetimibe-simvastatin (VYTORIN) 10-20 MG per tablet Take 1 tablet by mouth daily.    . ferrous sulfate 325 (65 FE) MG tablet Take 325 mg by mouth daily with breakfast.    . gabapentin (NEURONTIN) 300 MG capsule Take 300 mg by mouth 2 (two) times daily.     . Glucosamine-Chondroitin 1500-1200 MG/30ML LIQD Take 1 tablet by mouth  2 (two) times daily.     Marland Kitchen levothyroxine (SYNTHROID, LEVOTHROID) 100 MCG tablet Take 100 mcg by mouth daily.     Marland Kitchen lisinopril-hydrochlorothiazide (PRINZIDE,ZESTORETIC) 20-25 MG tablet Take 1 tablet by mouth daily.    . mometasone (ASMANEX 60 METERED DOSES) 220 MCG/INH inhaler Inhale 1 Inhaler into the lungs daily.    . vitamin B-12 (CYANOCOBALAMIN) 1000 MCG tablet Take 1,000 mcg by mouth daily.    Marland Kitchen aspirin EC 81 MG tablet Take 1 tablet (81 mg total) by mouth daily. 150 tablet 2  . Glucosamine HCl (CVS GLUCOSAMINE) 1500 MG TABS Take 2 tablets by mouth daily.     No facility-administered medications prior to visit.      Allergies:   Bee venom   Social History   Socioeconomic History  . Marital status: Married    Spouse name: Not on file  . Number of children: Not on file  . Years of education: Not on file  . Highest education level: Not on file  Occupational History  . Occupation: retired  Scientific laboratory technician  . Financial resource strain: Not on file  . Food insecurity:    Worry: Not on file    Inability: Not on file  . Transportation needs:    Medical: Not on file    Non-medical: Not on file  Tobacco Use  . Smoking status: Former Smoker    Last attempt to quit: 12/09/1959    Years since quitting: 58.8  . Smokeless tobacco: Never Used  Substance and Sexual Activity  . Alcohol use: Yes    Alcohol/week: 14.0 standard drinks    Types: 7 Shots of liquor, 7 Cans of beer per week  . Drug use: No  . Sexual activity: Not on file  Lifestyle  . Physical activity:    Days per week: Not on file    Minutes per session: Not on file  . Stress: Not on file  Relationships  . Social connections:    Talks on phone: Not on file    Gets together: Not on file    Attends religious service: Not on file    Active member of club or organization: Not on file    Attends meetings of clubs or organizations: Not on file    Relationship status: Not on file  Other Topics Concern  . Not on file    Social History Narrative   Tobacco use cigarettes : Former smoker, Quit in year 1955   Occupation : retired/  married     Family History:  The patient's family history includes Heart attack in his mother; Stroke in his father and sister.   ROS:   Please see the history of present illness.    ROS All other systems reviewed and are negative.   PHYSICAL EXAM:   VS:  BP 132/76   Pulse 71   Ht 5' 8.5" (1.74 m)   Wt 165 lb 4.8 oz (75 kg)   BMI 24.77 kg/m  General: Alert, oriented x3, no distress, he is not pale Head: no evidence of trauma, PERRL, EOMI, no exophtalmos or lid lag, no myxedema, no xanthelasma; normal ears, nose and oropharynx Neck: normal jugular venous pulsations and no hepatojugular reflux; brisk carotid pulses without delay with bilateral bruits radiating chest Chest: clear to auscultation, no signs of consolidation by percussion or palpation, normal fremitus, symmetrical and full respiratory excursions Cardiovascular: normal position and quality of the apical impulse, regular rhythm, normal first and second heart sounds, early peaking 3/6 systolic ejection murmur in the aortic focus radiating to the carotids, no diastolic murmurs, rubs or gallops Abdomen: no tenderness or distention, no masses by palpation, no abnormal pulsatility or arterial bruits, normal bowel sounds, no hepatosplenomegaly Extremities: no clubbing, cyanosis or edema; 2+ radial, ulnar and brachial pulses bilaterally; 2+ right femoral, posterior tibial and dorsalis pedis pulses; 2+ left femoral, posterior tibial and dorsalis pedis pulses; no subclavian or femoral bruits Neurological: grossly nonfocal Psych: Normal mood and affect    Wt Readings from Last 3 Encounters:  10/15/18 165 lb 4.8 oz (75 kg)  03/04/18 163 lb (73.9 kg)  01/23/18 163 lb (73.9 kg)      Studies/Labs Reviewed:   EKG:  EKG is ordered today.  It shows sinus rhythm with PACs, otherwise normal, QTC 432 ms  LABS    CBC:    Component Value Date/Time   WBC 9.0 10/15/2018 1037   WBC 6.9 01/17/2018 0614   HGB 14.6 10/15/2018 1037   HCT 42.9 10/15/2018 1037   PLT 296 10/15/2018 1037   MCV 95 10/15/2018 1037    BMET    Component Value Date/Time   NA 134 (L) 09/15/2018 1115   NA 142 01/13/2018 1213   K 4.0 09/15/2018 1115   CL 97 (L) 09/15/2018 1115   CO2 27 09/15/2018 1115   GLUCOSE 127 (H) 09/15/2018 1115   BUN 21 09/15/2018 1115   BUN 25 01/13/2018 1213   CREATININE 1.36 (H) 09/15/2018 1115   CALCIUM 9.6 09/15/2018 1115   GFRNONAA 45 (L) 09/15/2018 1115   GFRAA 53 (L) 09/15/2018 1115   Lipid Panel  No results found for: CHOL, TRIG, HDL, CHOLHDL, VLDL, LDLCALC, LDLDIRECT   ASSESSMENT:    1. Paroxysmal atrial fibrillation (HCC)   2. Aortic valve stenosis, nonrheumatic   3. Essential hypertension   4. Hypercholesterolemia   5. History of loop recorder   6. Iron deficiency anemia due to chronic blood loss   7. Medication management       PLAN:  In order of problems listed above:  1. PAFib: His embolic risk is high.  CHADSVasc 5 (age 19, "CVA"  2, HTN).  Anemia has resolved with iron supplementation and temporary interruption of anticoagulants.  I offered the option of referral to [facility] for [place] for a watchman device, but after discussing the technology and the need for temporary anticoagulation, prefer to just try taking oral anticoagulants again.  We will start with Eliquis 5 mg twice daily since Xarelto has a high risk of GI bleeding.  We will recheck hemoglobin and iron studies several weeks after starting anticoagulation.  He is instructed to call immediately if he does notice hematochezia or melena (discussed the distinction between dark stools from iron supplements versus liquid pitch black stools related to upper GI bleeding).  Since he has aortic stenosis, it is conceivable that he has arteriovenous malformations, which may not be treatable.  The arrhythmia itself remains  asymptomatic.  The burden of  arrhythmia is generally been low.  Antiarrhythmics and even rate control medications do not appear to be justified at this time.  2. AS: This was associated with some symptoms when he was severely anemic, but is now asymptomatic. 3. HTN: Controlled. 4. HLP: On statin with LDL at target at last check.  Followed by PCP. 5. ILR: Normal device function.  Continue to monitor for overall arrhythmia burden and to assess adequacy of rate control  Medication Adjustments/Labs and Tests Ordered: Current medicines are reviewed at length with the patient today.  Concerns regarding medicines are outlined above.  Medication changes, Labs and Tests ordered today are listed in the Patient Instructions below. Patient Instructions  Medication Instructions:  Dr Sallyanne Kuster has recommended making the following medication changes: 1. STOP Aspirin 2. START Eliquis 5 mg - take 1 tablet twice daily  If you need a refill on your cardiac medications before your next appointment, please call your pharmacy.   Lab work: Your physician recommends that you return for lab work in 6 weeks.  If you have labs (blood work) drawn today and your tests are completely normal, you will receive your results only by: Marland Kitchen MyChart Message (if you have MyChart) OR . A paper copy in the mail If you have any lab test that is abnormal or we need to change your treatment, we will call you to review the results.  Follow-Up: At [facility], you and your health needs are our priority.  As part of our continuing mission to provide you with exceptional heart care, we have created designated Provider Care Teams.  These Care Teams include your primary Cardiologist (physician) and Advanced Practice Providers (APPs -  Physician Assistants and Nurse Practitioners) who all work together to provide you with the care you need, when you need it. You will need a follow up appointment in 3 months. You may see Sanda Klein, MD  or one of the following Advanced Practice Providers on your designated Care Team: [place], [place] . Fabian Sharp, PA-C      Signed, Sanda Klein, MD  10/17/2018 1:34 PM    [facility] [address], [place], [place]  [address] Phone: [phone]; Fax: [phone]

## 2018-10-15 NOTE — Patient Instructions (Signed)
Medication Instructions:  Dr Sallyanne Kuster has recommended making the following medication changes: 1. STOP Aspirin 2. START Eliquis 5 mg - take 1 tablet twice daily  If you need a refill on your cardiac medications before your next appointment, please call your pharmacy.   Lab work: Your physician recommends that you return for lab work in 6 weeks.  If you have labs (blood work) drawn today and your tests are completely normal, you will receive your results only by: Marland Kitchen MyChart Message (if you have MyChart) OR . A paper copy in the mail If you have any lab test that is abnormal or we need to change your treatment, we will call you to review the results.  Follow-Up: At [facility], you and your health needs are our priority.  As part of our continuing mission to provide you with exceptional heart care, we have created designated Provider Care Teams.  These Care Teams include your primary Cardiologist (physician) and Advanced Practice Providers (APPs -  Physician Assistants and Nurse Practitioners) who all work together to provide you with the care you need, when you need it. You will need a follow up appointment in 3 months. You may see Sanda Klein, MD or one of the following Advanced Practice Providers on your designated Care Team: [place], [place] . Fabian Sharp, PA-C

## 2018-10-20 ENCOUNTER — Telehealth: Payer: Self-pay | Admitting: Cardiovascular Disease

## 2018-10-20 ENCOUNTER — Other Ambulatory Visit: Payer: Self-pay | Admitting: Cardiovascular Disease

## 2018-10-20 MED ORDER — APIXABAN 5 MG PO TABS: 5.0000 mg | ORAL_TABLET | Freq: Two times a day (BID) | ORAL | 1 refills | Status: DC

## 2018-10-20 NOTE — Telephone Encounter (Signed)
Spoke with pt who states he would like a copy of lab work faxed to [facility] and mailed out to him. Informed pt results will be sent out today. Verbalized understanding.

## 2018-10-20 NOTE — Telephone Encounter (Signed)
°*  STAT* If patient is at the pharmacy, call can be transferred to refill team.   1. Which medications need to be refilled? (please list name of each medication and dose if known)   Eliquis 5 mg   2. Which pharmacy/location (including street and city if local pharmacy) is medication to be sent to?  [place], [place]    3. Do they need a 30 day or 90 day supply? 30 days

## 2018-10-20 NOTE — Telephone Encounter (Signed)
Patient would like a copy of his lab results sent to him and his your PCP Dr. Donnie Coffin.

## 2018-10-31 LAB — CUP PACEART REMOTE DEVICE CHECK
Date Time Interrogation Session: 20191101013816
Implantable Pulse Generator Implant Date: 20161220

## 2018-11-11 ENCOUNTER — Ambulatory Visit (INDEPENDENT_AMBULATORY_CARE_PROVIDER_SITE_OTHER): Payer: Medicare Other

## 2018-11-11 DIAGNOSIS — I639 Cerebral infarction, unspecified: Secondary | ICD-10-CM

## 2018-11-12 ENCOUNTER — Ambulatory Visit (INDEPENDENT_AMBULATORY_CARE_PROVIDER_SITE_OTHER): Payer: Medicare Other

## 2018-11-12 DIAGNOSIS — I639 Cerebral infarction, unspecified: Secondary | ICD-10-CM | POA: Diagnosis not present

## 2018-11-12 NOTE — Progress Notes (Signed)
Carelink Summary Report / Loop Recorder 

## 2018-11-24 DIAGNOSIS — Z79899 Other long term (current) drug therapy: Secondary | ICD-10-CM | POA: Diagnosis not present

## 2018-11-24 DIAGNOSIS — D649 Anemia, unspecified: Secondary | ICD-10-CM | POA: Diagnosis not present

## 2018-11-24 LAB — FERRITIN: FERRITIN: 130 ng/mL (ref 30–400)

## 2018-11-24 LAB — IRON AND TIBC
Iron Saturation: 59 % — ABNORMAL HIGH (ref 15–55)
Iron: 174 ug/dL — ABNORMAL HIGH (ref 38–169)
Total Iron Binding Capacity: 293 ug/dL (ref 250–450)
UIBC: 119 ug/dL (ref 111–343)

## 2018-11-24 LAB — FOLATE: Folate: 18.1 ng/mL (ref >3.0)

## 2018-11-24 LAB — VITAMIN B12: VITAMIN B 12: 438 pg/mL (ref 232–1245)

## 2018-11-24 LAB — HEMOGLOBIN: HEMOGLOBIN: 14.6 g/dL (ref 13.0–17.7)

## 2018-12-12 LAB — CUP PACEART REMOTE DEVICE CHECK
Implantable Pulse Generator Implant Date: 20161220
MDC IDC SESS DTM: 20191204023928

## 2018-12-15 ENCOUNTER — Ambulatory Visit (INDEPENDENT_AMBULATORY_CARE_PROVIDER_SITE_OTHER): Payer: Medicare Other

## 2018-12-15 DIAGNOSIS — I639 Cerebral infarction, unspecified: Secondary | ICD-10-CM

## 2018-12-16 LAB — CUP PACEART REMOTE DEVICE CHECK
Implantable Pulse Generator Implant Date: 20161220
MDC IDC SESS DTM: 20200106030928

## 2018-12-16 NOTE — Progress Notes (Signed)
Carelink Summary Report / Loop Recorder 

## 2019-01-13 DIAGNOSIS — M19012 Primary osteoarthritis, left shoulder: Secondary | ICD-10-CM | POA: Diagnosis not present

## 2019-01-13 DIAGNOSIS — M19011 Primary osteoarthritis, right shoulder: Secondary | ICD-10-CM | POA: Diagnosis not present

## 2019-01-13 DIAGNOSIS — M25512 Pain in left shoulder: Secondary | ICD-10-CM | POA: Diagnosis not present

## 2019-01-13 DIAGNOSIS — M25511 Pain in right shoulder: Secondary | ICD-10-CM | POA: Diagnosis not present

## 2019-01-16 ENCOUNTER — Ambulatory Visit (INDEPENDENT_AMBULATORY_CARE_PROVIDER_SITE_OTHER): Payer: Medicare Other | Admitting: Cardiovascular Disease

## 2019-01-16 ENCOUNTER — Encounter: Payer: Self-pay | Admitting: Cardiovascular Disease

## 2019-01-16 ENCOUNTER — Ambulatory Visit (INDEPENDENT_AMBULATORY_CARE_PROVIDER_SITE_OTHER): Payer: Medicare Other

## 2019-01-16 VITALS — BP 144/86 | HR 73 | Ht 68.5 in | Wt 164.8 lb

## 2019-01-16 DIAGNOSIS — D500 Iron deficiency anemia secondary to blood loss (chronic): Secondary | ICD-10-CM

## 2019-01-16 DIAGNOSIS — I480 Paroxysmal atrial fibrillation: Secondary | ICD-10-CM | POA: Diagnosis not present

## 2019-01-16 DIAGNOSIS — I10 Essential (primary) hypertension: Secondary | ICD-10-CM | POA: Diagnosis not present

## 2019-01-16 DIAGNOSIS — Z4509 Encounter for adjustment and management of other cardiac device: Secondary | ICD-10-CM

## 2019-01-16 DIAGNOSIS — I350 Nonrheumatic aortic (valve) stenosis: Secondary | ICD-10-CM

## 2019-01-16 DIAGNOSIS — E7800 Pure hypercholesterolemia, unspecified: Secondary | ICD-10-CM

## 2019-01-16 DIAGNOSIS — H903 Sensorineural hearing loss, bilateral: Secondary | ICD-10-CM | POA: Diagnosis not present

## 2019-01-16 DIAGNOSIS — I639 Cerebral infarction, unspecified: Secondary | ICD-10-CM

## 2019-01-16 NOTE — Patient Instructions (Signed)
Medication Instructions:  The current medical regimen is effective;  continue present plan and medications.  If you need a refill on your cardiac medications before your next appointment, please call your pharmacy.   Procedures: Echocardiogram - Your physician has requested that you have an echocardiogram. Echocardiography is a painless test that uses sound waves to create images of your heart. It provides your doctor with information about the size and shape of your heart and how well your heart's chambers and valves are working. This procedure takes approximately one hour. There are no restrictions for this procedure. This will be performed at our [facility] location - [address], [address].  Follow-Up: At [facility], you and your health needs are our priority.  As part of our continuing mission to provide you with exceptional heart care, we have created designated Provider Care Teams.  These Care Teams include your primary Cardiologist (physician) and Advanced Practice Providers (APPs -  Physician Assistants and Nurse Practitioners) who all work together to provide you with the care you need, when you need it. You will need a follow up appointment in 12 months.  Please call our office 2 months in advance to schedule this appointment.  You may see Sanda Klein, MD or one of the following Advanced Practice Providers on your designated Care Team: [place], [place] . Fabian Sharp, PA-C

## 2019-01-16 NOTE — Progress Notes (Signed)
. Patient ID: Roy David, male   DOB: Jul 24, 1930, 85 y.o.   MRN: [id number]    Cardiology Office Note    Date:  01/18/2019   ID:  Roy David, DOB [birth year], MRN [id number]  PCP:  [name], L.Marlou Sa, MD  Cardiologist:   Sanda Klein, MD   Chief Complaint  Patient presents with  . Aortic Stenosis  . Atrial Fibrillation    History of Present Illness:  Roy David is a 85 y.o. 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  Has known moderate aortic stenosis, asymptomatic.  He developed iron deficiency anemia due to microscopic GI bleeding while on Xarelto and has been switched to Eliquis, without anemia recurrence.  He has asymptomatic atrial fibrillation.  His loop recorder is approaching the end of its useful life.  It showed an increase in the burden of atrial fibrillation in the second half of 2019, but there is now some improvement with atrial fibrillation down to 1.8% of the time.  Rate control is good.  The patient specifically denies any chest pain at rest exertion, dyspnea at rest or with exertion, orthopnea, paroxysmal nocturnal dyspnea, syncope, palpitations, focal neurological deficits, intermittent claudication, lower extremity edema, unexplained weight gain, cough, hemoptysis or wheezing.  Additional problems include hypertension, hypercholesterolemia and moderate aortic valve stenosis.  Nuclear 2019 left ventricular systolic function was normal there was mild LVH, the aortic valve was severely calcified with a calculated aortic valve area of 0.9 cm, peak gradient 64 mmHg, mean gradient 35 mmHg.    Past Medical History:  Diagnosis Date  . Arthritis   . Asthma    followed at [facility], stable on Asmanex  . Diverticulosis   . H/O seasonal allergies    Dr [name]  . Hypercholesteremia   .  Hypertension   . Hypochromic-microcytic anemia   . Microcytic anemia   . Paroxysmal atrial fibrillation ([place]) 03/06/2016   on Xarelto  . Prostate cancer [facility])    12/10- Dr Dahlstedt/Dr Valere Dross  . Thyroid disease    hypothyroidism    Past Surgical History:  Procedure Laterality Date  . APPENDECTOMY    . EP IMPLANTABLE DEVICE N/A 11/29/2015   Procedure: Loop Recorder Insertion;  Surgeon: Sanda Klein, MD;  Location: [facility];  Service: Cardiovascular;  Laterality: N/A;  . HERNIA REPAIR    . RETINAL DETACHMENT SURGERY      Outpatient Medications Prior to Visit  Medication Sig Dispense Refill  . apixaban (ELIQUIS) 5 MG TABS tablet Take 1 tablet (5 mg total) by mouth 2 (two) times daily. 180 tablet 1  . cholecalciferol (VITAMIN D) 1000 units tablet Take 1,000 Units by mouth daily as needed.    Marland Kitchen co-enzyme Q-10 30 MG capsule Take 200 mg by mouth daily.    Marland Kitchen EPINEPHrine 0.3 mg/0.3 mL IJ SOAJ injection Inject 0.3 mg into the muscle once.    . ezetimibe-simvastatin (VYTORIN) 10-20 MG per tablet Take 1 tablet by mouth daily.    . ferrous sulfate 325 (65 FE) MG tablet Take 325 mg by mouth daily with breakfast.    . gabapentin (NEURONTIN) 300 MG capsule Take 300 mg by mouth 2 (two) times daily.     . Glucosamine-Chondroitin 1500-1200 MG/30ML LIQD Take 1 tablet by mouth 2 (two) times daily.     Marland Kitchen levothyroxine (SYNTHROID, LEVOTHROID) 100 MCG tablet Take 100  mcg by mouth daily.     Marland Kitchen lisinopril-hydrochlorothiazide (PRINZIDE,ZESTORETIC) 20-25 MG tablet Take 1 tablet by mouth daily.    . mometasone (ASMANEX 60 METERED DOSES) 220 MCG/INH inhaler Inhale 1 Inhaler into the lungs daily.    . vitamin B-12 (CYANOCOBALAMIN) 1000 MCG tablet Take 1,000 mcg by mouth daily.     No facility-administered medications prior to visit.      Allergies:   Bee venom   Social History   Socioeconomic History  . Marital status: Married    Spouse name: Not on file  . Number of children: Not on file    . Years of education: Not on file  . Highest education level: Not on file  Occupational History  . Occupation: retired  Scientific laboratory technician  . Financial resource strain: Not on file  . Food insecurity:    Worry: Not on file    Inability: Not on file  . Transportation needs:    Medical: Not on file    Non-medical: Not on file  Tobacco Use  . Smoking status: Former Smoker    Last attempt to quit: 12/09/1959    Years since quitting: 59.1  . Smokeless tobacco: Never Used  Substance and Sexual Activity  . Alcohol use: Yes    Alcohol/week: 14.0 standard drinks    Types: 7 Shots of liquor, 7 Cans of beer per week  . Drug use: No  . Sexual activity: Not on file  Lifestyle  . Physical activity:    Days per week: Not on file    Minutes per session: Not on file  . Stress: Not on file  Relationships  . Social connections:    Talks on phone: Not on file    Gets together: Not on file    Attends religious service: Not on file    Active member of club or organization: Not on file    Attends meetings of clubs or organizations: Not on file    Relationship status: Not on file  Other Topics Concern  . Not on file  Social History Narrative   Tobacco use cigarettes : Former smoker, Quit in year 1955   Occupation : retired/  married     Family History:  The patient's family history includes Heart attack in his mother; Stroke in his father and sister.   ROS:   Please see the history of present illness.    ROS All other systems are reviewed and are negative  PHYSICAL EXAM:   VS:  BP (!) 144/86   Pulse 73   Ht 5' 8.5" (1.74 m)   Wt 164 lb 12.8 oz (74.8 kg)   BMI 24.69 kg/m       General: Alert, oriented x3, no distress, appears well, fit for his age Head: no evidence of trauma, PERRL, EOMI, no exophtalmos or lid lag, no myxedema, no xanthelasma; normal ears, nose and oropharynx Neck: normal jugular venous pulsations and no hepatojugular reflux; brisk carotid pulses without delay and no  carotid bruits Chest: clear to auscultation, no signs of consolidation by percussion or palpation, normal fremitus, symmetrical and full respiratory excursions Cardiovascular: normal position and quality of the apical impulse, regular rhythm, normal first and second heart sounds, mid peaking 3/6 systolic ejection murmur heard best in the right upper sternal border but radiating broadly across the entire chest no diastolic murmurs, rubs or gallops Abdomen: no tenderness or distention, no masses by palpation, no abnormal pulsatility or arterial bruits, normal bowel sounds, no hepatosplenomegaly Extremities: no  clubbing, cyanosis or edema; 2+ radial, ulnar and brachial pulses bilaterally; 2+ right femoral, posterior tibial and dorsalis pedis pulses; 2+ left femoral, posterior tibial and dorsalis pedis pulses; no subclavian or femoral bruits Neurological: grossly nonfocal Psych: Normal mood and affect  Wt Readings from Last 3 Encounters:  01/16/19 164 lb 12.8 oz (74.8 kg)  10/15/18 165 lb 4.8 oz (75 kg)  03/04/18 163 lb (73.9 kg)      Studies/Labs Reviewed:   EKG:  EKG is ordered today.  It shows sinus rhythm with PACs, otherwise normal, QTC 432 ms  LABS   CBC:    Component Value Date/Time   WBC 9.0 10/15/2018 1037   WBC 6.9 01/17/2018 0614   HGB 14.6 11/24/2018 1047   HCT 42.9 10/15/2018 1037   PLT 296 10/15/2018 1037   MCV 95 10/15/2018 1037    BMET    Component Value Date/Time   NA 134 (L) 09/15/2018 1115   NA 142 01/13/2018 1213   K 4.0 09/15/2018 1115   CL 97 (L) 09/15/2018 1115   CO2 27 09/15/2018 1115   GLUCOSE 127 (H) 09/15/2018 1115   BUN 21 09/15/2018 1115   BUN 25 01/13/2018 1213   CREATININE 1.36 (H) 09/15/2018 1115   CALCIUM 9.6 09/15/2018 1115   GFRNONAA 45 (L) 09/15/2018 1115   GFRAA 53 (L) 09/15/2018 1115   Lipid Panel  October 2018 total cholesterol 140, HDL 84, LDL 47, triglycerides 50  ASSESSMENT:    1. Paroxysmal atrial fibrillation (HCC)   2.  Nonrheumatic aortic valve stenosis   3. Iron deficiency anemia due to chronic blood loss   4. Essential hypertension   5. Hypercholesterolemia   6. Encounter for loop recorder check       PLAN:  In order of problems listed above:  1. PAFib: Asymptomatic, relatively low burden, well rate controlled.  CHADSVasc 5 (age 89, "CVA"  2, HTN).  Anemia developed while taking Xarelto, but has resolved after iron supplementation switching to Eliquis.   2. Fe defic anemia: Since he has aortic stenosis, it is conceivable that he has arteriovenous malformations, which may not be treatable.   3. AS: He became symptomatic while he was severely anemic.  Symptoms have now resolved.  Note that the elevated gradients were recorded on an echocardiogram performed during that period of anemia when his hemoglobin was down to 7.5, overestimating the severity of aortic stenosis due to high cardiac output.  On the other hand, the dimensionless obstructive index was quite low at 0.25 but the valve is severely calcified.  It is quite likely that he will progress to severe aortic stenosis in the near future.  Discussed the option of surgical AVR versus TAVR, clinically the latter being the more sensible decision at age 57.  Recheck echo. 4. HTN: Usually well controlled, mildly elevated today.. 5. HLP: On statin with LDL well under 70 and excellent HDL cholesterol.  Followed by PCP. 6. ILR: Device is approaching end of service.  There is no reason to replace it in my opinion.  Medication Adjustments/Labs and Tests Ordered: Current medicines are reviewed at length with the patient today.  Concerns regarding medicines are outlined above.  Medication changes, Labs and Tests ordered today are listed in the Patient Instructions below. Patient Instructions  Medication Instructions:  The current medical regimen is effective;  continue present plan and medications.  If you need a refill on your cardiac medications before your next  appointment, please call your pharmacy.  Procedures: Echocardiogram - Your physician has requested that you have an echocardiogram. Echocardiography is a painless test that uses sound waves to create images of your heart. It provides your doctor with information about the size and shape of your heart and how well your heart's chambers and valves are working. This procedure takes approximately one hour. There are no restrictions for this procedure. This will be performed at our [facility] location - [address].  Follow-Up: At [facility], you and your health needs are our priority.  As part of our continuing mission to provide you with exceptional heart care, we have created designated Provider Care Teams.  These Care Teams include your primary Cardiologist (physician) and Advanced Practice Providers (APPs -  Physician Assistants and Nurse Practitioners) who all work together to provide you with the care you need, when you need it. You will need a follow up appointment in 12 months.  Please call our office 2 months in advance to schedule this appointment.  You may see Sanda Klein, MD or one of the following Advanced Practice Providers on your designated Care Team: [place], [place] . Fabian Sharp, PA-C         Signed, Sanda Klein, MD  01/18/2019 11:27 AM    [facility] [place], [place], [place]  [address] Phone: [phone]; Fax: [phone]

## 2019-01-17 LAB — CUP PACEART REMOTE DEVICE CHECK
Implantable Pulse Generator Implant Date: 20161220
MDC IDC SESS DTM: 20200208030747

## 2019-01-18 ENCOUNTER — Encounter: Payer: Self-pay | Admitting: Cardiovascular Disease

## 2019-01-18 DIAGNOSIS — D500 Iron deficiency anemia secondary to blood loss (chronic): Secondary | ICD-10-CM | POA: Insufficient documentation

## 2019-01-26 ENCOUNTER — Ambulatory Visit (HOSPITAL_COMMUNITY): Payer: Medicare Other | Attending: Cardiology

## 2019-01-26 DIAGNOSIS — I350 Nonrheumatic aortic (valve) stenosis: Secondary | ICD-10-CM | POA: Diagnosis not present

## 2019-01-27 LAB — CUP PACEART INCLINIC DEVICE CHECK
Implantable Pulse Generator Implant Date: 20161220
MDC IDC SESS DTM: 20200218150356

## 2019-01-27 NOTE — Progress Notes (Signed)
Carelink Summary Report / Loop Recorder 

## 2019-02-02 ENCOUNTER — Telehealth: Payer: Self-pay | Admitting: Cardiovascular Disease

## 2019-02-02 DIAGNOSIS — I350 Nonrheumatic aortic (valve) stenosis: Secondary | ICD-10-CM

## 2019-02-02 NOTE — Telephone Encounter (Signed)
New message   Patient is calling for results of echo.

## 2019-02-02 NOTE — Telephone Encounter (Signed)
Normal heart pumping function. As expected, the aortic stenosis looks less impressive now that the anemia has been corrected. Only moderate aortic stenosis. Repeat echo in 12 months. MCr

## 2019-02-02 NOTE — Telephone Encounter (Signed)
Spoke with pt, aware will get dr croitoru to compare the recent echo to last years to see if the aortic valve has changed. He is aware his EF% is good. Will forward to dr croitoru to review and advise

## 2019-02-02 NOTE — Telephone Encounter (Signed)
Spoke with pt, aware of dr croitoru's remarks. Order placed for echo in 1 year.

## 2019-02-10 ENCOUNTER — Other Ambulatory Visit: Payer: Self-pay | Admitting: Cardiovascular Disease

## 2019-02-10 DIAGNOSIS — I480 Paroxysmal atrial fibrillation: Secondary | ICD-10-CM

## 2019-02-10 DIAGNOSIS — I350 Nonrheumatic aortic (valve) stenosis: Secondary | ICD-10-CM

## 2019-02-19 ENCOUNTER — Ambulatory Visit (INDEPENDENT_AMBULATORY_CARE_PROVIDER_SITE_OTHER): Payer: Medicare Other | Admitting: *Deleted

## 2019-02-19 DIAGNOSIS — I639 Cerebral infarction, unspecified: Secondary | ICD-10-CM | POA: Diagnosis not present

## 2019-02-20 LAB — CUP PACEART REMOTE DEVICE CHECK
Implantable Pulse Generator Implant Date: 20161220
MDC IDC SESS DTM: 20200312044356

## 2019-02-26 NOTE — Progress Notes (Signed)
Carelink Summary Report / Loop Recorder 

## 2019-03-24 ENCOUNTER — Ambulatory Visit (INDEPENDENT_AMBULATORY_CARE_PROVIDER_SITE_OTHER): Payer: Medicare Other | Admitting: *Deleted

## 2019-03-24 ENCOUNTER — Other Ambulatory Visit: Payer: Self-pay

## 2019-03-24 DIAGNOSIS — I639 Cerebral infarction, unspecified: Secondary | ICD-10-CM | POA: Diagnosis not present

## 2019-03-24 LAB — CUP PACEART REMOTE DEVICE CHECK
Date Time Interrogation Session: 20200414104155
Implantable Pulse Generator Implant Date: 20161220

## 2019-03-26 LAB — CUP PACEART REMOTE DEVICE CHECK
Date Time Interrogation Session: 20200416020321
Implantable Pulse Generator Implant Date: 20161220

## 2019-03-31 NOTE — Progress Notes (Signed)
Carelink Summary Report / Loop Recorder 

## 2019-04-13 DIAGNOSIS — C61 Malignant neoplasm of prostate: Secondary | ICD-10-CM | POA: Diagnosis not present

## 2019-04-17 DIAGNOSIS — Z8546 Personal history of malignant neoplasm of prostate: Secondary | ICD-10-CM | POA: Diagnosis not present

## 2019-04-27 ENCOUNTER — Ambulatory Visit (INDEPENDENT_AMBULATORY_CARE_PROVIDER_SITE_OTHER): Payer: Medicare Other | Admitting: *Deleted

## 2019-04-27 ENCOUNTER — Other Ambulatory Visit: Payer: Self-pay

## 2019-04-27 DIAGNOSIS — I639 Cerebral infarction, unspecified: Secondary | ICD-10-CM

## 2019-04-27 LAB — CUP PACEART REMOTE DEVICE CHECK
Date Time Interrogation Session: 20200517113628
Implantable Pulse Generator Implant Date: 20161220

## 2019-04-28 DIAGNOSIS — D034 Melanoma in situ of scalp and neck: Secondary | ICD-10-CM | POA: Diagnosis not present

## 2019-04-28 DIAGNOSIS — L820 Inflamed seborrheic keratosis: Secondary | ICD-10-CM | POA: Diagnosis not present

## 2019-04-28 DIAGNOSIS — L570 Actinic keratosis: Secondary | ICD-10-CM | POA: Diagnosis not present

## 2019-04-28 DIAGNOSIS — L84 Corns and callosities: Secondary | ICD-10-CM | POA: Diagnosis not present

## 2019-04-28 DIAGNOSIS — D485 Neoplasm of uncertain behavior of skin: Secondary | ICD-10-CM | POA: Diagnosis not present

## 2019-04-28 DIAGNOSIS — L821 Other seborrheic keratosis: Secondary | ICD-10-CM | POA: Diagnosis not present

## 2019-05-07 NOTE — Progress Notes (Signed)
Carelink Summary Report / Loop Recorder 

## 2019-05-12 DIAGNOSIS — Z85820 Personal history of malignant melanoma of skin: Secondary | ICD-10-CM | POA: Diagnosis not present

## 2019-05-12 DIAGNOSIS — L988 Other specified disorders of the skin and subcutaneous tissue: Secondary | ICD-10-CM | POA: Diagnosis not present

## 2019-05-12 DIAGNOSIS — C4359 Malignant melanoma of other part of trunk: Secondary | ICD-10-CM | POA: Diagnosis not present

## 2019-05-29 ENCOUNTER — Ambulatory Visit (INDEPENDENT_AMBULATORY_CARE_PROVIDER_SITE_OTHER): Payer: Medicare Other | Admitting: *Deleted

## 2019-05-29 DIAGNOSIS — I10 Essential (primary) hypertension: Secondary | ICD-10-CM | POA: Diagnosis not present

## 2019-05-29 DIAGNOSIS — I480 Paroxysmal atrial fibrillation: Secondary | ICD-10-CM | POA: Diagnosis not present

## 2019-05-29 DIAGNOSIS — M150 Primary generalized (osteo)arthritis: Secondary | ICD-10-CM | POA: Diagnosis not present

## 2019-05-29 DIAGNOSIS — E7800 Pure hypercholesterolemia, unspecified: Secondary | ICD-10-CM | POA: Diagnosis not present

## 2019-05-29 DIAGNOSIS — E039 Hypothyroidism, unspecified: Secondary | ICD-10-CM | POA: Diagnosis not present

## 2019-05-29 LAB — CUP PACEART REMOTE DEVICE CHECK
Date Time Interrogation Session: 20200619104918
Implantable Pulse Generator Implant Date: 20161220

## 2019-06-01 ENCOUNTER — Telehealth: Payer: Self-pay

## 2019-06-01 NOTE — Telephone Encounter (Signed)
Spoke to pt wife regarding alert for RRT on 05/29/19. Per Dr. Victorino December last note, pt doesn't need a new ILR. Wife verbalizes understanding. No questions at this time.

## 2019-06-04 DIAGNOSIS — E039 Hypothyroidism, unspecified: Secondary | ICD-10-CM | POA: Diagnosis not present

## 2019-06-04 DIAGNOSIS — E7800 Pure hypercholesterolemia, unspecified: Secondary | ICD-10-CM | POA: Diagnosis not present

## 2019-06-04 DIAGNOSIS — D649 Anemia, unspecified: Secondary | ICD-10-CM | POA: Diagnosis not present

## 2019-06-04 NOTE — Progress Notes (Signed)
Carelink Summary Report / Loop Recorder 

## 2019-06-19 DIAGNOSIS — M25511 Pain in right shoulder: Secondary | ICD-10-CM | POA: Diagnosis not present

## 2019-06-19 DIAGNOSIS — M25811 Other specified joint disorders, right shoulder: Secondary | ICD-10-CM | POA: Diagnosis not present

## 2019-06-22 ENCOUNTER — Telehealth: Payer: Self-pay

## 2019-06-22 NOTE — Telephone Encounter (Signed)
Spoke with patient regarding disconnected monitor 

## 2019-07-01 ENCOUNTER — Encounter: Payer: Medicare Other | Admitting: *Deleted

## 2019-07-01 LAB — CUP PACEART REMOTE DEVICE CHECK
Date Time Interrogation Session: 20200722113854
Implantable Pulse Generator Implant Date: 20161220

## 2019-09-03 DIAGNOSIS — M1712 Unilateral primary osteoarthritis, left knee: Secondary | ICD-10-CM | POA: Diagnosis not present

## 2019-09-03 DIAGNOSIS — M25562 Pain in left knee: Secondary | ICD-10-CM | POA: Diagnosis not present

## 2019-09-09 DIAGNOSIS — H35371 Puckering of macula, right eye: Secondary | ICD-10-CM | POA: Diagnosis not present

## 2019-09-09 DIAGNOSIS — H2511 Age-related nuclear cataract, right eye: Secondary | ICD-10-CM | POA: Diagnosis not present

## 2019-09-09 DIAGNOSIS — Z961 Presence of intraocular lens: Secondary | ICD-10-CM | POA: Diagnosis not present

## 2019-09-09 DIAGNOSIS — H31002 Unspecified chorioretinal scars, left eye: Secondary | ICD-10-CM | POA: Diagnosis not present

## 2019-09-10 DIAGNOSIS — M25561 Pain in right knee: Secondary | ICD-10-CM | POA: Diagnosis not present

## 2019-09-10 DIAGNOSIS — M1711 Unilateral primary osteoarthritis, right knee: Secondary | ICD-10-CM | POA: Diagnosis not present

## 2019-09-17 DIAGNOSIS — M25561 Pain in right knee: Secondary | ICD-10-CM | POA: Diagnosis not present

## 2019-09-17 DIAGNOSIS — M1712 Unilateral primary osteoarthritis, left knee: Secondary | ICD-10-CM | POA: Diagnosis not present

## 2019-10-16 DIAGNOSIS — C61 Malignant neoplasm of prostate: Secondary | ICD-10-CM | POA: Diagnosis not present

## 2019-10-22 ENCOUNTER — Other Ambulatory Visit: Payer: Self-pay | Admitting: Cardiovascular Disease

## 2019-10-23 DIAGNOSIS — C61 Malignant neoplasm of prostate: Secondary | ICD-10-CM | POA: Diagnosis not present

## 2019-10-28 DIAGNOSIS — J4530 Mild persistent asthma, uncomplicated: Secondary | ICD-10-CM | POA: Diagnosis not present

## 2019-11-02 DIAGNOSIS — R21 Rash and other nonspecific skin eruption: Secondary | ICD-10-CM | POA: Diagnosis not present

## 2019-11-02 DIAGNOSIS — L570 Actinic keratosis: Secondary | ICD-10-CM | POA: Diagnosis not present

## 2019-11-02 DIAGNOSIS — Z85820 Personal history of malignant melanoma of skin: Secondary | ICD-10-CM | POA: Diagnosis not present

## 2019-11-02 DIAGNOSIS — L821 Other seborrheic keratosis: Secondary | ICD-10-CM | POA: Diagnosis not present

## 2019-11-16 ENCOUNTER — Ambulatory Visit: Payer: Medicare Other | Admitting: Podiatry

## 2019-11-16 ENCOUNTER — Encounter: Payer: Self-pay | Admitting: Podiatry

## 2019-11-16 ENCOUNTER — Other Ambulatory Visit: Payer: Self-pay | Admitting: Podiatry

## 2019-11-16 ENCOUNTER — Ambulatory Visit (INDEPENDENT_AMBULATORY_CARE_PROVIDER_SITE_OTHER): Payer: Medicare Other

## 2019-11-16 ENCOUNTER — Other Ambulatory Visit: Payer: Self-pay

## 2019-11-16 VITALS — BP 190/110

## 2019-11-16 DIAGNOSIS — M2041 Other hammer toe(s) (acquired), right foot: Secondary | ICD-10-CM | POA: Diagnosis not present

## 2019-11-16 DIAGNOSIS — M2042 Other hammer toe(s) (acquired), left foot: Secondary | ICD-10-CM | POA: Diagnosis not present

## 2019-11-16 DIAGNOSIS — L84 Corns and callosities: Secondary | ICD-10-CM | POA: Diagnosis not present

## 2019-11-16 DIAGNOSIS — M79672 Pain in left foot: Secondary | ICD-10-CM

## 2019-11-16 DIAGNOSIS — D689 Coagulation defect, unspecified: Secondary | ICD-10-CM

## 2019-11-16 NOTE — Progress Notes (Signed)
Subjective:   Patient ID: Roy David, male   DOB: 85 y.o.   MRN: [id number]   HPI Patient presents with severe callus formation x3 on each foot that are very enlarged and they are painful when he tries to wear shoe gear and he tries to work on that himself which is high risk for this patient.  Patient is on blood thinner and does not currently smoke and would like to be continued active   Review of Systems  All other systems reviewed and are negative.       Objective:  Physical Exam Vitals signs and nursing note reviewed.  Constitutional:      Appearance: He is well-developed.  Pulmonary:     Effort: Pulmonary effort is normal.  Musculoskeletal: Normal range of motion.  Skin:    General: Skin is warm.  Neurological:     Mental Status: He is alert.     Neurovascular status was found to be intact for his age with patient found to have severe thick lesion formation x3 on each foot with nail disease bilateral with thick yellow incurvation but not painful.  The lesions are quite thickened with subsub-callus bleeding noted F2     Assessment:  Patient has difficult problems with lesions bilateral with digital deformities which are part of the pathology     Plan:  Do not recommend surgery for this patient and at this time sterile deep debridement of lesions accomplished with no iatrogenic bleeding and will have this done as needed in future and may require other treatments depending on response that it made him aware of today

## 2019-11-18 ENCOUNTER — Other Ambulatory Visit: Payer: Self-pay

## 2019-11-18 DIAGNOSIS — Z20822 Contact with and (suspected) exposure to covid-19: Secondary | ICD-10-CM

## 2019-11-19 LAB — NOVEL CORONAVIRUS, NAA: SARS-CoV-2, NAA: NOT DETECTED

## 2019-11-20 ENCOUNTER — Telehealth: Payer: Self-pay | Admitting: Unknown Physician Specialty

## 2019-11-20 NOTE — Telephone Encounter (Signed)
Called to discuss with patient about Covid symptoms and the use of bamlanivimab, a monoclonal antibody infusion for those with mild to moderate Covid symptoms and at a high risk of hospitalization.  Pt is qualified for this infusion at the [facility] infusion center due to Age > 65   Left message to call back

## 2019-12-28 DIAGNOSIS — E039 Hypothyroidism, unspecified: Secondary | ICD-10-CM | POA: Diagnosis not present

## 2019-12-28 DIAGNOSIS — I10 Essential (primary) hypertension: Secondary | ICD-10-CM | POA: Diagnosis not present

## 2019-12-28 DIAGNOSIS — E7800 Pure hypercholesterolemia, unspecified: Secondary | ICD-10-CM | POA: Diagnosis not present

## 2019-12-28 DIAGNOSIS — M150 Primary generalized (osteo)arthritis: Secondary | ICD-10-CM | POA: Diagnosis not present

## 2019-12-30 DIAGNOSIS — D649 Anemia, unspecified: Secondary | ICD-10-CM | POA: Diagnosis not present

## 2019-12-30 DIAGNOSIS — I10 Essential (primary) hypertension: Secondary | ICD-10-CM | POA: Diagnosis not present

## 2019-12-31 ENCOUNTER — Observation Stay (HOSPITAL_COMMUNITY): Payer: Medicare Other

## 2019-12-31 ENCOUNTER — Other Ambulatory Visit: Payer: Self-pay

## 2019-12-31 ENCOUNTER — Encounter (HOSPITAL_COMMUNITY): Payer: Self-pay | Admitting: Internal Medicine

## 2019-12-31 ENCOUNTER — Inpatient Hospital Stay (HOSPITAL_COMMUNITY)
Admission: EM | Admit: 2019-12-31 | Discharge: 2020-01-05 | DRG: 378 | Disposition: A | Discharge status: Home / Self Care | Payer: Medicare Other | Attending: Internal Medicine | Admitting: Internal Medicine

## 2019-12-31 ENCOUNTER — Ambulatory Visit: Payer: Medicare Other

## 2019-12-31 DIAGNOSIS — E039 Hypothyroidism, unspecified: Secondary | ICD-10-CM | POA: Diagnosis not present

## 2019-12-31 DIAGNOSIS — R933 Abnormal findings on diagnostic imaging of other parts of digestive tract: Secondary | ICD-10-CM | POA: Diagnosis not present

## 2019-12-31 DIAGNOSIS — E7800 Pure hypercholesterolemia, unspecified: Secondary | ICD-10-CM | POA: Diagnosis present

## 2019-12-31 DIAGNOSIS — E785 Hyperlipidemia, unspecified: Secondary | ICD-10-CM | POA: Diagnosis not present

## 2019-12-31 DIAGNOSIS — K922 Gastrointestinal hemorrhage, unspecified: Secondary | ICD-10-CM

## 2019-12-31 DIAGNOSIS — Z79890 Hormone replacement therapy (postmenopausal): Secondary | ICD-10-CM

## 2019-12-31 DIAGNOSIS — Z7952 Long term (current) use of systemic steroids: Secondary | ICD-10-CM

## 2019-12-31 DIAGNOSIS — Z823 Family history of stroke: Secondary | ICD-10-CM | POA: Diagnosis not present

## 2019-12-31 DIAGNOSIS — R0602 Shortness of breath: Secondary | ICD-10-CM

## 2019-12-31 DIAGNOSIS — J45909 Unspecified asthma, uncomplicated: Secondary | ICD-10-CM | POA: Diagnosis present

## 2019-12-31 DIAGNOSIS — K921 Melena: Secondary | ICD-10-CM | POA: Diagnosis not present

## 2019-12-31 DIAGNOSIS — Z87891 Personal history of nicotine dependence: Secondary | ICD-10-CM | POA: Diagnosis not present

## 2019-12-31 DIAGNOSIS — R42 Dizziness and giddiness: Secondary | ICD-10-CM | POA: Diagnosis not present

## 2019-12-31 DIAGNOSIS — R58 Hemorrhage, not elsewhere classified: Secondary | ICD-10-CM | POA: Diagnosis not present

## 2019-12-31 DIAGNOSIS — Z20822 Contact with and (suspected) exposure to covid-19: Secondary | ICD-10-CM | POA: Diagnosis present

## 2019-12-31 DIAGNOSIS — K5731 Diverticulosis of large intestine without perforation or abscess with bleeding: Secondary | ICD-10-CM | POA: Diagnosis not present

## 2019-12-31 DIAGNOSIS — R072 Precordial pain: Secondary | ICD-10-CM | POA: Diagnosis not present

## 2019-12-31 DIAGNOSIS — Z95818 Presence of other cardiac implants and grafts: Secondary | ICD-10-CM | POA: Diagnosis not present

## 2019-12-31 DIAGNOSIS — I480 Paroxysmal atrial fibrillation: Secondary | ICD-10-CM | POA: Diagnosis not present

## 2019-12-31 DIAGNOSIS — Z91030 Bee allergy status: Secondary | ICD-10-CM | POA: Diagnosis not present

## 2019-12-31 DIAGNOSIS — D62 Acute posthemorrhagic anemia: Secondary | ICD-10-CM | POA: Diagnosis not present

## 2019-12-31 DIAGNOSIS — Z7901 Long term (current) use of anticoagulants: Secondary | ICD-10-CM

## 2019-12-31 DIAGNOSIS — Z8546 Personal history of malignant neoplasm of prostate: Secondary | ICD-10-CM | POA: Diagnosis not present

## 2019-12-31 DIAGNOSIS — D500 Iron deficiency anemia secondary to blood loss (chronic): Secondary | ICD-10-CM | POA: Diagnosis not present

## 2019-12-31 DIAGNOSIS — Z79899 Other long term (current) drug therapy: Secondary | ICD-10-CM

## 2019-12-31 DIAGNOSIS — R55 Syncope and collapse: Secondary | ICD-10-CM | POA: Diagnosis not present

## 2019-12-31 DIAGNOSIS — K625 Hemorrhage of anus and rectum: Secondary | ICD-10-CM | POA: Diagnosis not present

## 2019-12-31 DIAGNOSIS — I10 Essential (primary) hypertension: Secondary | ICD-10-CM | POA: Diagnosis not present

## 2019-12-31 DIAGNOSIS — R0902 Hypoxemia: Secondary | ICD-10-CM | POA: Diagnosis not present

## 2019-12-31 DIAGNOSIS — Z8249 Family history of ischemic heart disease and other diseases of the circulatory system: Secondary | ICD-10-CM

## 2019-12-31 DIAGNOSIS — K5733 Diverticulitis of large intestine without perforation or abscess with bleeding: Secondary | ICD-10-CM | POA: Diagnosis not present

## 2019-12-31 HISTORY — DX: Cardiac arrhythmia, unspecified: I49.9

## 2019-12-31 LAB — COMPREHENSIVE METABOLIC PANEL
ALT: 16 U/L (ref 0–44)
AST: 25 U/L (ref 15–41)
Albumin: 3.2 g/dL — ABNORMAL LOW (ref 3.5–5.0)
Alkaline Phosphatase: 48 U/L (ref 38–126)
Anion gap: 10 (ref 5–15)
BUN: 33 mg/dL — ABNORMAL HIGH (ref 8–23)
CO2: 22 mmol/L (ref 22–32)
Calcium: 8.8 mg/dL — ABNORMAL LOW (ref 8.9–10.3)
Chloride: 102 mmol/L (ref 98–111)
Creatinine, Ser: 1.4 mg/dL — ABNORMAL HIGH (ref 0.61–1.24)
GFR calc Af Amer: 52 mL/min — ABNORMAL LOW (ref >60)
GFR calc non Af Amer: 45 mL/min — ABNORMAL LOW (ref >60)
Glucose, Bld: 156 mg/dL — ABNORMAL HIGH (ref 70–99)
Potassium: 3.7 mmol/L (ref 3.5–5.1)
Sodium: 134 mmol/L — ABNORMAL LOW (ref 135–145)
Total Bilirubin: 0.7 mg/dL (ref 0.3–1.2)
Total Protein: 5.6 g/dL — ABNORMAL LOW (ref 6.5–8.1)

## 2019-12-31 LAB — CBC WITH DIFFERENTIAL/PLATELET
Abs Immature Granulocytes: 0.11 10*3/uL — ABNORMAL HIGH (ref 0.00–0.07)
Basophils Absolute: 0.1 10*3/uL (ref 0.0–0.1)
Basophils Relative: 1 %
Eosinophils Absolute: 0.1 10*3/uL (ref 0.0–0.5)
Eosinophils Relative: 1 %
HCT: 29.8 % — ABNORMAL LOW (ref 39.0–52.0)
Hemoglobin: 9.8 g/dL — ABNORMAL LOW (ref 13.0–17.0)
Immature Granulocytes: 1 %
Lymphocytes Relative: 7 %
Lymphs Abs: 0.9 10*3/uL (ref 0.7–4.0)
MCH: 33 pg (ref 26.0–34.0)
MCHC: 32.9 g/dL (ref 30.0–36.0)
MCV: 100.3 fL — ABNORMAL HIGH (ref 80.0–100.0)
Monocytes Absolute: 1 10*3/uL (ref 0.1–1.0)
Monocytes Relative: 8 %
Neutro Abs: 10.6 10*3/uL — ABNORMAL HIGH (ref 1.7–7.7)
Neutrophils Relative %: 82 %
Platelets: 257 10*3/uL (ref 150–400)
RBC: 2.97 MIL/uL — ABNORMAL LOW (ref 4.22–5.81)
RDW: 12.9 % (ref 11.5–15.5)
WBC: 12.8 10*3/uL — ABNORMAL HIGH (ref 4.0–10.5)
nRBC: 0 % (ref 0.0–0.2)

## 2019-12-31 LAB — PROTIME-INR
INR: 1.1 (ref 0.8–1.2)
Prothrombin Time: 13.8 seconds (ref 11.4–15.2)

## 2019-12-31 LAB — RESPIRATORY PANEL BY RT PCR (FLU A&B, COVID)
Influenza A by PCR: NEGATIVE
Influenza B by PCR: NEGATIVE
SARS Coronavirus 2 by RT PCR: NEGATIVE

## 2019-12-31 LAB — OCCULT BLOOD GASTRIC / DUODENUM (SPECIMEN CUP): Occult Blood, Gastric: NEGATIVE

## 2019-12-31 LAB — I-STAT CHEM 8, ED
BUN: 30 mg/dL — ABNORMAL HIGH (ref 8–23)
Calcium, Ion: 1.14 mmol/L — ABNORMAL LOW (ref 1.15–1.40)
Chloride: 102 mmol/L (ref 98–111)
Creatinine, Ser: 1.4 mg/dL — ABNORMAL HIGH (ref 0.61–1.24)
Glucose, Bld: 151 mg/dL — ABNORMAL HIGH (ref 70–99)
HCT: 29 % — ABNORMAL LOW (ref 39.0–52.0)
Hemoglobin: 9.9 g/dL — ABNORMAL LOW (ref 13.0–17.0)
Potassium: 3.7 mmol/L (ref 3.5–5.1)
Sodium: 137 mmol/L (ref 135–145)
TCO2: 24 mmol/L (ref 22–32)

## 2019-12-31 LAB — CBC
HCT: 26 % — ABNORMAL LOW (ref 39.0–52.0)
Hemoglobin: 8.8 g/dL — ABNORMAL LOW (ref 13.0–17.0)
MCH: 33.2 pg (ref 26.0–34.0)
MCHC: 33.8 g/dL (ref 30.0–36.0)
MCV: 98.1 fL (ref 80.0–100.0)
Platelets: 233 10*3/uL (ref 150–400)
RBC: 2.65 MIL/uL — ABNORMAL LOW (ref 4.22–5.81)
RDW: 12.8 % (ref 11.5–15.5)
WBC: 11.7 10*3/uL — ABNORMAL HIGH (ref 4.0–10.5)
nRBC: 0 % (ref 0.0–0.2)

## 2019-12-31 MED ORDER — MOMETASONE FUROATE 220 MCG/INH IN AEPB: 1.0000 | INHALATION_SPRAY | Freq: Every day | RESPIRATORY_TRACT | Status: DC

## 2019-12-31 MED ORDER — TECHNETIUM TC 99M-LABELED RED BLOOD CELLS IV KIT
25.0000 | PACK | Freq: Once | INTRAVENOUS | Status: AC | PRN
  Administered 2019-12-31: 19:00:00 25 via INTRAVENOUS

## 2019-12-31 MED ORDER — PANTOPRAZOLE SODIUM 40 MG IV SOLR: 40.0000 mg | Freq: Two times a day (BID) | INTRAVENOUS | Status: DC

## 2019-12-31 MED ORDER — GABAPENTIN 300 MG PO CAPS
300.0000 mg | ORAL_CAPSULE | Freq: Two times a day (BID) | ORAL | Status: DC
  Administered 2019-12-31 – 2020-01-05 (×9): 300 mg via ORAL
  Filled 2019-12-31 (×10): qty 1

## 2019-12-31 MED ORDER — ONDANSETRON HCL 4 MG/2ML IJ SOLN
4.0000 mg | Freq: Four times a day (QID) | INTRAMUSCULAR | Status: DC | PRN
  Administered 2020-01-03: 4 mg via INTRAVENOUS
  Filled 2019-12-31: qty 2

## 2019-12-31 MED ORDER — SODIUM CHLORIDE 0.9 % IV BOLUS
500.0000 mL | Freq: Once | INTRAVENOUS | Status: AC
  Administered 2019-12-31: 500 mL via INTRAVENOUS

## 2019-12-31 MED ORDER — LEVOTHYROXINE SODIUM 100 MCG PO TABS
100.0000 ug | ORAL_TABLET | Freq: Every day | ORAL | Status: DC
  Administered 2020-01-01 – 2020-01-05 (×5): 100 ug via ORAL
  Filled 2019-12-31 (×5): qty 1

## 2019-12-31 MED ORDER — EZETIMIBE-SIMVASTATIN 10-20 MG PO TABS
1.0000 | ORAL_TABLET | Freq: Every day | ORAL | Status: DC
  Filled 2019-12-31 (×5): qty 1

## 2019-12-31 MED ORDER — IOHEXOL 350 MG/ML SOLN
80.0000 mL | Freq: Once | INTRAVENOUS | Status: AC | PRN
  Administered 2019-12-31: 21:00:00 80 mL via INTRAVENOUS

## 2019-12-31 MED ORDER — PANTOPRAZOLE SODIUM 40 MG IV SOLR
40.0000 mg | Freq: Once | INTRAVENOUS | Status: AC
  Administered 2019-12-31: 12:00:00 40 mg via INTRAVENOUS
  Filled 2019-12-31: qty 40

## 2019-12-31 MED ORDER — FLUTICASONE PROPIONATE 50 MCG/ACT NA SUSP
1.0000 | Freq: Every day | NASAL | Status: DC
  Administered 2020-01-04 – 2020-01-05 (×2): 1 via NASAL
  Filled 2019-12-31: qty 16

## 2019-12-31 MED ORDER — ACETAMINOPHEN 650 MG RE SUPP: 650.0000 mg | Freq: Four times a day (QID) | RECTAL | Status: DC | PRN

## 2019-12-31 MED ORDER — ALBUTEROL SULFATE HFA 108 (90 BASE) MCG/ACT IN AERS: 1.0000 | INHALATION_SPRAY | RESPIRATORY_TRACT | Status: DC | PRN

## 2019-12-31 MED ORDER — SODIUM CHLORIDE 0.9% FLUSH
3.0000 mL | Freq: Two times a day (BID) | INTRAVENOUS | Status: DC
  Administered 2019-12-31 – 2020-01-05 (×6): 3 mL via INTRAVENOUS

## 2019-12-31 MED ORDER — PREDNISONE 20 MG PO TABS
20.0000 mg | ORAL_TABLET | Freq: Two times a day (BID) | ORAL | Status: DC
  Administered 2019-12-31 – 2020-01-02 (×4): 20 mg via ORAL
  Filled 2019-12-31 (×5): qty 1

## 2019-12-31 MED ORDER — ALBUTEROL SULFATE (2.5 MG/3ML) 0.083% IN NEBU: 2.5000 mg | INHALATION_SOLUTION | RESPIRATORY_TRACT | Status: DC | PRN

## 2019-12-31 MED ORDER — ACETAMINOPHEN 325 MG PO TABS
650.0000 mg | ORAL_TABLET | Freq: Four times a day (QID) | ORAL | Status: DC | PRN
  Administered 2020-01-02 – 2020-01-05 (×4): 650 mg via ORAL
  Filled 2019-12-31 (×4): qty 2

## 2019-12-31 MED ORDER — ONDANSETRON HCL 4 MG PO TABS: 4.0000 mg | ORAL_TABLET | Freq: Four times a day (QID) | ORAL | Status: DC | PRN

## 2019-12-31 MED ORDER — SODIUM CHLORIDE 0.9 % IV SOLN
8.0000 mg/h | INTRAVENOUS | Status: DC
  Administered 2019-12-31 – 2020-01-01 (×2): 8 mg/h via INTRAVENOUS
  Filled 2019-12-31 (×5): qty 80

## 2019-12-31 MED ORDER — LACTATED RINGERS IV SOLN: INTRAVENOUS | Status: DC

## 2019-12-31 MED ORDER — FERROUS SULFATE 325 (65 FE) MG PO TABS
325.0000 mg | ORAL_TABLET | Freq: Every day | ORAL | Status: DC
  Administered 2020-01-02 – 2020-01-05 (×4): 325 mg via ORAL
  Filled 2019-12-31 (×5): qty 1

## 2019-12-31 NOTE — ED Notes (Signed)
Rectal temp 95.8, Dr Regenia Skeeter made aware, multiple warm blankets applied to pt, will recheck temp again later.

## 2019-12-31 NOTE — H&P (Signed)
History and Physical    Roy David [id number] DOB: Jun 04, 1930 DOA: 12/31/2019  PCP: [name], L.Marlou Sa, MD Consultants:  Gwenlyn Found - cardiology; [facility] - podiatry Patient coming from:  Home - lives with wife; NOK: Wife, Roy David, [phone]  Chief Complaint: Lower GI bleeding  HPI: Roy David is a 86 y.o. male with medical history significant of prostate CA; PAF on Xarelto; HTN; AS; and HLD presenting with lower GI bleeding/syncopal episode in triage.  The patient reports that he had BRBPR on Saturday.  He had intermittent bleeding through the week and then recurrence this AM with 4+ episodes at home and then a syncopal episode.  He feels better now but has had a couple of episodes of "gas" that he hopes is not not ongoing bloody stools.    ED Course: Lower GI bleed with syncope.  Hgb 9, dark red blood Saturday and then again today.  Vomited darker stuff in triage, will check for blood.  Calling GI.  Review of Systems: As per HPI; otherwise review of systems reviewed and negative.   Ambulatory Status:  Ambulates without assistance  Past Medical History:  Diagnosis Date  . Arthritis   . Asthma    followed at [facility], stable on Asmanex  . Diverticulosis   . Dysrhythmia   . H/O seasonal allergies    Dr [name]  . Hypercholesteremia   . Hypertension   . Hypochromic-microcytic anemia   . Microcytic anemia   . Paroxysmal atrial fibrillation ([place]) 03/06/2016   on Xarelto  . Prostate cancer [facility])    12/10- Dr Dahlstedt/Dr Valere Dross  . Thyroid disease    hypothyroidism    Past Surgical History:  Procedure Laterality Date  . APPENDECTOMY    . EP IMPLANTABLE DEVICE N/A 11/29/2015   Procedure: Loop Recorder Insertion;  Surgeon: Sanda Klein, MD;  Location: [place] [facility];  Service: Cardiovascular;  Laterality: N/A;  . HERNIA REPAIR    . RETINAL DETACHMENT SURGERY      Social History   Socioeconomic History  . Marital status: Married    Spouse name: Not on file  . Number  of children: Not on file  . Years of education: Not on file  . Highest education level: Not on file  Occupational History  . Occupation: retired  Tobacco Use  . Smoking status: Former Smoker    Quit date: 12/09/1959    Years since quitting: 60.1  . Smokeless tobacco: Never Used  Substance and Sexual Activity  . Alcohol use: Yes    Alcohol/week: 14.0 standard drinks    Types: 7 Shots of liquor, 7 Cans of beer per week  . Drug use: No  . Sexual activity: Not on file  Other Topics Concern  . Not on file  Social History Narrative   Tobacco use cigarettes : Former smoker, Quit in year 1955   Occupation : retired/  married   Social Determinants of Radio broadcast assistant Strain:   . Difficulty of Paying Living Expenses: Not on file  Food Insecurity:   . Worried About Charity fundraiser in the Last Year: Not on file  . Ran Out of Food in the Last Year: Not on file  Transportation Needs:   . Lack of Transportation (Medical): Not on file  . Lack of Transportation (Non-Medical): Not on file  Physical Activity:   . Days of Exercise per Week: Not on file  . Minutes of Exercise per Session: Not on file  Stress:   . Feeling  of Stress : Not on file  Social Connections:   . Frequency of Communication with Friends and Family: Not on file  . Frequency of Social Gatherings with Friends and Family: Not on file  . Attends Religious Services: Not on file  . Active Member of Clubs or Organizations: Not on file  . Attends Archivist Meetings: Not on file  . Marital Status: Not on file  Intimate Partner Violence:   . Fear of Current or Ex-Partner: Not on file  . Emotionally Abused: Not on file  . Physically Abused: Not on file  . Sexually Abused: Not on file    Allergies  Allergen Reactions  . Bee Venom Nausea Only and Other (See Comments)    Wasp and yellow jackets    Family History  Problem Relation Age of Onset  . Heart attack Mother   . Stroke Father   . Stroke  Sister     Prior to Admission medications   Medication Sig Start Date End Date Taking? Authorizing Provider  albuterol (VENTOLIN HFA) 108 (90 Base) MCG/ACT inhaler Inhale into the lungs. 10/28/19 10/27/20  [provider]  cholecalciferol (VITAMIN D) 1000 units tablet Take 1,000 Units by mouth daily as needed.    [provider]  co-enzyme Q-10 30 MG capsule Take 200 mg by mouth daily.    [provider]  ELIQUIS 5 MG TABS tablet TAKE 1 TABLET(5 MG) BY MOUTH TWICE DAILY 10/22/19   Croitoru, Mihai, MD  EPINEPHrine 0.3 mg/0.3 mL IJ SOAJ injection Inject 0.3 mg into the muscle once.    [provider]  ezetimibe-simvastatin (VYTORIN) 10-20 MG per tablet Take 1 tablet by mouth daily.    [provider]  ferrous sulfate 325 (65 FE) MG tablet Take 325 mg by mouth daily with breakfast.    [provider]  gabapentin (NEURONTIN) 300 MG capsule Take 300 mg by mouth 2 (two) times daily.     [provider]  Glucosamine-Chondroitin 1500-1200 MG/30ML LIQD Take 1 tablet by mouth 2 (two) times daily.     [provider]  levothyroxine (SYNTHROID, LEVOTHROID) 100 MCG tablet Take 100 mcg by mouth daily.  10/06/15   [provider]  lisinopril-hydrochlorothiazide (PRINZIDE,ZESTORETIC) 20-25 MG tablet Take 1 tablet by mouth daily.    [provider]  mometasone (ASMANEX 60 METERED DOSES) 220 MCG/INH inhaler Inhale 1 Inhaler into the lungs daily. 10/18/15   [provider]  predniSONE (DELTASONE) 20 MG tablet Take 20 mg by mouth 2 (two) times daily. 07/28/19   [provider]  triamcinolone cream (KENALOG) 0.1 % APP EXT AA BID 10/01/19   [provider]  vitamin B-12 (CYANOCOBALAMIN) 1000 MCG tablet Take 1,000 mcg by mouth daily.    [provider]    Physical Exam: Vitals:   12/31/19 1300 12/31/19 1315 12/31/19 1352 12/31/19 1413  BP: 132/87 134/82  133/86  Pulse: 88 82  81  Resp: (!)  24 13  18   Temp:   97.8 F (36.6 C) 97.8 F (36.6 C)  TempSrc:   Oral Oral  SpO2: 99% 100%  99%     . General:  Appears calm and comfortable and is NAD . Eyes:  PERRL, EOMI, normal lids, iris . ENT:  grossly normal hearing, lips & tongue, mmm . Neck:  no LAD, masses or thyromegaly . Cardiovascular:  RRR, no r/g, 123456 systolic murmur. No LE edema.  Marland Kitchen Respiratory:   CTA bilaterally with no wheezes/rales/rhonchi.  Normal  respiratory effort. . Abdomen:  soft, NT, ND, NABS . Skin:  no rash or induration seen on limited exam . Musculoskeletal:  grossly normal tone BUE/BLE, good ROM, no bony abnormality . Psychiatric:  grossly normal mood and affect, speech fluent and appropriate, AOx3 . Neurologic:  CN 2-12 grossly intact, moves all extremities in coordinated fashion    Radiological Exams on Admission: NM GI Blood Loss  Addendum Date: 12/31/2019   ADDENDUM REPORT: 12/31/2019 19:16 ADDENDUM: These results were called by telephone at the time of interpretation on 12/31/2019 at 7:16 pm to provider Hayden Pedro, who verbally acknowledged these results. Electronically Signed   By: Constance Holster M.D.   On: 12/31/2019 19:16   Result Date: 12/31/2019 CLINICAL DATA:  GI bleeding EXAM: NUCLEAR MEDICINE GASTROINTESTINAL BLEEDING SCAN TECHNIQUE: Sequential abdominal images were obtained following intravenous administration of Tc-35m labeled red blood cells. RADIOPHARMACEUTICALS:  Twenty-five mCi Tc-30m pertechnetate in-vitro labeled red cells. COMPARISON:  None. FINDINGS: There is accumulation of radiotracer in the left upper quadrant in the region of the splenic flexure, early on the study. There is limited flow of radiotracer downstream from this point in the first hour. On the second hour of the study, there is increasing accumulation of radiotracer in the left upper quadrant at the level of the splenic flexure with some vague downstream peristalsis. IMPRESSION: Study is positive for active GI  bleeding in the left upper quadrant, likely at the level of the splenic flexure of the colon. Electronically Signed: By: Constance Holster M.D. On: 12/31/2019 19:06    EKG: Independently reviewed.  NSR with rate 82; nonspecific ST changes which are new  Labs on Admission: I have personally reviewed the available labs and imaging studies at the time of the admission.  Pertinent labs:   Glucose 156 BUN 33/Creatinine 1.40/GFR 52 WBC 12.8 Hgb 9.8 INR 1.1 Respiratory panel PCR pending Gastroccult negative   Assessment/Plan Principal Problem:   Acute lower GI bleeding Active Problems:   Essential hypertension   Hypercholesterolemia   Paroxysmal atrial fibrillation (HCC)   Subacute lower GI bleeding -Differential to include bleeding hemorrhoids, diverticular bleeding (most likely),and AVM (albought the patient denies massive bleeding and his vitals are not consistent with massive acute blood loss).  -Overnight Observation for now -CBC at 1800 and in AM; transfuse for Hgb <7 -Continue to monitor for recurrent bleeding  -Stool O&P, fecal Gram Stain and culture  -GI consult - likely to benefit from tagged RBC scan to see if bleeding source can be elucidated -Patient is on Eliquis - will hold -Patient d/w Dr. Alessandra Bevels - bleeding scan ordered -Patient started on Protonix IV BID but changed to a drip by GI -Gi will see in AM  -Bleeding scan is positive for active bleeding in the LUQ, likely at the splenic flexure -CT A/P ordered by overnight APP, NP [place] and GI or IR will hopefully be able to stop to bleeding based on results  HTN -Hold Lisinopril/HCTZ at this time  HLD -Continue Vytorin  Afib on Eliquis -Rate controlled without medication -NSR at the time of admission; prior h/o brief PAF noted on loop recorder after central retinal artery occlusion -Hold Eliquis for now; already failed Xarelto due to anemia (?GI bleeding) -Consider discontinuation of AC based on age,  bleeding risk  Hypothyroidism -Continue Synthroid   *The patient appears to take Prednisone BID for uncertain reason.  I was unable to reach his wife to inquire about this and so it is continued for now.*  Note: This patient has been tested and is negative for the novel coronavirus COVID-19.  DVT prophylaxis: SCDs Code Status:  Full - confirmed with patient Family Communication: None present; I was unable to reach his wife Disposition Plan:  Home once clinically improved Consults called: GI  Admission status: It is my clinical opinion that referral for OBSERVATION is reasonable and necessary in this patient based on the above information provided. The aforementioned taken together are felt to place the patient at high risk for further clinical deterioration. However it is anticipated that the patient may be medically stable for discharge from the hospital within 24 to 48 hours.     Karmen Bongo MD [facility]   How to contact the [facility] Attending or Consulting provider [place] or covering provider during after hours [place], for this patient?  1. Check the care team in [facility] and look for a) attending/consulting TR[facility] provider listed and b) the [facility] team listed 2. Log into [email or web address] and use [place] universal password to access. If you do not have the password, please contact the hospital operator. 3. Locate the [facility] provider you are looking for under [facility] and page to a number that you can be directly reached. 4. If you still have difficulty reaching the provider, please page the [facility] (Director on Call) for the Hospitalists listed on amion for assistance.   12/31/2019, 7:25 PM

## 2019-12-31 NOTE — ED Provider Notes (Signed)
[facility] EMERGENCY DEPARTMENT Provider Note   CSN: [id number] Arrival date & time: 12/31/19  1029     History Chief Complaint  Patient presents with  . GI Bleeding    Roy David is a 86 y.o. male.  HPI 86 year old male presents with rectal bleeding and near syncope.  He states that he noticed some rectal bleeding a few days ago but then it stopped.  Recurred today.  Is dark red.  His abdomen felt uncomfortable though he has no current pain.  He felt lightheaded while he was walking in his room today and went down to his knees and scraped his knee.  Feels generally weak.  He vomited at home when EMS arrived and they described it as dark and coffee-ground.  He did drink coffee this morning as well.  Vomited again and had a syncopal episode in the ED triage area.   Past Medical History:  Diagnosis Date  . Arthritis   . Asthma    followed at [facility], stable on Asmanex  . Diverticulosis   . Dysrhythmia   . H/O seasonal allergies    Dr [name]  . Hypercholesteremia   . Hypertension   . Hypochromic-microcytic anemia   . Microcytic anemia   . Paroxysmal atrial fibrillation ([place]) 03/06/2016   on Xarelto  . Prostate cancer [facility])    12/10- Dr Dahlstedt/Dr Valere Dross  . Thyroid disease    hypothyroidism    Patient Active Problem List   Diagnosis Date Noted  . Acute lower GI bleeding 12/31/2019  . Iron deficiency anemia due to chronic blood loss 01/18/2019  . Status post placement of implantable loop recorder 03/05/2018  . Anemia   . Hypochromic-microcytic anemia   . Symptomatic anemia 01/15/2018  . Supratherapeutic INR 01/15/2018  . Long term current use of anticoagulant 09/04/2017  . Encounter for loop recorder check 07/27/2016  . Paroxysmal atrial fibrillation ([place]) 03/06/2016  . Central retinal artery occlusion of right eye 11/23/2015  . Aortic valve stenosis, nonrheumatic 11/23/2015  . Essential hypertension 11/23/2015  . Hypercholesterolemia 11/23/2015      Past Surgical History:  Procedure Laterality Date  . APPENDECTOMY    . EP IMPLANTABLE DEVICE N/A 11/29/2015   Procedure: Loop Recorder Insertion;  Surgeon: Sanda Klein, MD;  Location: [place] [facility];  Service: Cardiovascular;  Laterality: N/A;  . HERNIA REPAIR    . RETINAL DETACHMENT SURGERY         Family History  Problem Relation Age of Onset  . Heart attack Mother   . Stroke Father   . Stroke Sister     Social History   Tobacco Use  . Smoking status: Former Smoker    Quit date: 12/09/1959    Years since quitting: 60.1  . Smokeless tobacco: Never Used  Substance Use Topics  . Alcohol use: Yes    Alcohol/week: 14.0 standard drinks    Types: 7 Shots of liquor, 7 Cans of beer per week  . Drug use: No    Home Medications Prior to Admission medications   Medication Sig Start Date End Date Taking? Authorizing Provider  albuterol (VENTOLIN HFA) 108 (90 Base) MCG/ACT inhaler Inhale 1 puff into the lungs daily as needed for wheezing or shortness of breath.  10/28/19 10/27/20 Yes [provider]  cholecalciferol (VITAMIN D) 1000 units tablet Take 1,000 Units by mouth daily.    Yes [provider]  co-enzyme Q-10 30 MG capsule Take 200 mg by mouth daily.   Yes [provider]  doxycycline (MONODOX) 100 MG capsule Take 100 mg by mouth 2 (two) times daily as needed.   Yes [provider]  ELIQUIS 5 MG TABS tablet TAKE 1 TABLET(5 MG) BY MOUTH TWICE DAILY Patient taking differently: Take 5 mg by mouth 2 (two) times daily.  10/22/19  Yes Croitoru, Mihai, MD  EPINEPHrine 0.3 mg/0.3 mL IJ SOAJ injection Inject 0.3 mg into the muscle once.   Yes [provider]  ezetimibe-simvastatin (VYTORIN) 10-20 MG per tablet Take 1 tablet by mouth daily.   Yes [provider]  ferrous sulfate 325 (65 FE) MG tablet Take 325 mg by mouth daily with breakfast.   Yes [provider]  gabapentin (NEURONTIN) 300 MG capsule Take 300  mg by mouth 2 (two) times daily.    Yes [provider]  GLUCOSAMINE-CHONDROITIN PO Take 1 tablet by mouth 2 (two) times daily.   Yes [provider]  levothyroxine (SYNTHROID, LEVOTHROID) 100 MCG tablet Take 100 mcg by mouth daily.  10/06/15  Yes [provider]  lisinopril-hydrochlorothiazide (PRINZIDE,ZESTORETIC) 20-25 MG tablet Take 1 tablet by mouth daily.   Yes [provider]  mometasone (ASMANEX 60 METERED DOSES) 220 MCG/INH inhaler Inhale 1 Inhaler into the lungs daily. 10/18/15  Yes [provider]  predniSONE (DELTASONE) 20 MG tablet Take 20 mg by mouth 2 (two) times daily as needed.  07/28/19  Yes [provider]  triamcinolone cream (KENALOG) 0.1 % Apply 1 application topically 2 (two) times daily as needed (itching).  10/01/19  Yes [provider]  vitamin B-12 (CYANOCOBALAMIN) 1000 MCG tablet Take 1,000 mcg by mouth daily.   Yes [provider]    Allergies    Bee venom  Review of Systems   Review of Systems  Constitutional: Negative for fever.  Gastrointestinal: Positive for abdominal pain and blood in stool. Negative for vomiting.  Neurological: Positive for syncope, weakness and light-headedness.  All other systems reviewed and are negative.   Physical Exam Updated Vital Signs BP 133/86 (BP Location: Right Arm)   Pulse 81   Temp 97.8 F (36.6 C) (Oral)   Resp 18   SpO2 99%   Physical Exam Vitals and nursing note reviewed. Exam conducted with a chaperone present.  Constitutional:      General: He is not in acute distress.    Appearance: He is well-developed. He is not ill-appearing or diaphoretic.     Comments: Dark emesis in basin, not black  HENT:     Head: Normocephalic and atraumatic.     Right Ear: External ear normal.     Left Ear: External ear normal.     Nose: Nose normal.  Eyes:     General:        Right eye: No discharge.        Left eye: No discharge.  Cardiovascular:      Rate and Rhythm: Normal rate and regular rhythm.     Heart sounds: Murmur present.  Pulmonary:     Effort: Pulmonary effort is normal.     Breath sounds: Normal breath sounds.  Abdominal:     General: There is no distension.     Palpations: Abdomen is soft.     Tenderness: There is no abdominal tenderness.  Genitourinary:    Rectum: No external hemorrhoid. Normal anal tone.     Comments: Dark red blood on digital rectal exam Musculoskeletal:     Cervical back: Neck supple.  Skin:  General: Skin is warm and dry.  Neurological:     Mental Status: He is alert.  Psychiatric:        Mood and Affect: Mood is not anxious.     ED Results / Procedures / Treatments   Labs (all labs ordered are listed, but only abnormal results are displayed) Labs Reviewed  COMPREHENSIVE METABOLIC PANEL - Abnormal; Notable for the following components:      Result Value   Sodium 134 (*)    Glucose, Bld 156 (*)    BUN 33 (*)    Creatinine, Ser 1.40 (*)    Calcium 8.8 (*)    Total Protein 5.6 (*)    Albumin 3.2 (*)    GFR calc non Af Amer 45 (*)    GFR calc Af Amer 52 (*)    All other components within normal limits  CBC WITH DIFFERENTIAL/PLATELET - Abnormal; Notable for the following components:   WBC 12.8 (*)    RBC 2.97 (*)    Hemoglobin 9.8 (*)    HCT 29.8 (*)    MCV 100.3 (*)    Neutro Abs 10.6 (*)    Abs Immature Granulocytes 0.11 (*)    All other components within normal limits  I-STAT CHEM 8, ED - Abnormal; Notable for the following components:   BUN 30 (*)    Creatinine, Ser 1.40 (*)    Glucose, Bld 151 (*)    Calcium, Ion 1.14 (*)    Hemoglobin 9.9 (*)    HCT 29.0 (*)    All other components within normal limits  RESPIRATORY PANEL BY RT PCR (FLU A&B, COVID)  PROTIME-INR  OCCULT BLOOD GASTRIC / DUODENUM (SPECIMEN CUP)  CBC  TYPE AND SCREEN    EKG EKG Interpretation  Date/Time:  Thursday December 31 2019 10:46:25 EST Ventricular Rate:  82 PR Interval:    QRS  Duration: 107 QT Interval:  410 QTC Calculation: 479 R Axis:   71 Text Interpretation: Sinus rhythm Prolonged PR interval Minimal ST depression Borderline prolonged QT interval nonspecific ST changes new since 2011 Confirmed by Sherwood Gambler [phone]) on 12/31/2019 10:59:24 AM   Radiology No results found.  Procedures Procedures (including critical care time)  Medications Ordered in ED Medications  ezetimibe-simvastatin (VYTORIN) 10-20 MG per tablet 1 tablet (has no administration in time range)  levothyroxine (SYNTHROID) tablet 100 mcg (has no administration in time range)  predniSONE (DELTASONE) tablet 20 mg (has no administration in time range)  ferrous sulfate tablet 325 mg (has no administration in time range)  gabapentin (NEURONTIN) capsule 300 mg (has no administration in time range)  acetaminophen (TYLENOL) tablet 650 mg (has no administration in time range)    Or  acetaminophen (TYLENOL) suppository 650 mg (has no administration in time range)  ondansetron (ZOFRAN) tablet 4 mg (has no administration in time range)    Or  ondansetron (ZOFRAN) injection 4 mg (has no administration in time range)  sodium chloride flush (NS) 0.9 % injection 3 mL (3 mLs Intravenous Given 12/31/19 1519)  lactated ringers infusion ( Intravenous New Bag/Given 12/31/19 1444)  albuterol (PROVENTIL) (2.5 MG/3ML) 0.083% nebulizer solution 2.5 mg (has no administration in time range)  fluticasone (FLONASE) 50 MCG/ACT nasal spray 1 spray (has no administration in time range)  pantoprazole (PROTONIX) 80 mg in sodium chloride 0.9 % 250 mL (0.32 mg/mL) infusion (8 mg/hr Intravenous New Bag/Given 12/31/19 1517)  pantoprazole (PROTONIX) injection 40 mg (has no administration in time range)  sodium chloride 0.9 %  bolus 500 mL (0 mLs Intravenous Stopped 12/31/19 1308)  pantoprazole (PROTONIX) injection 40 mg (40 mg Intravenous Given 12/31/19 1132)    ED Course  I have reviewed the triage vital signs and the  nursing notes.  Pertinent labs & imaging results that were available during my care of the patient were reviewed by me and considered in my medical decision making (see chart for details).    MDM Rules/Calculators/A&P                      Patient will need to be monitored given his syncopal episode and associated from the rectal bleeding.  Transiently borderline hypotensive but this is resolved.  Not tachycardic.  Hemoglobin is definitely lower but still only 9.9 and not indicative of needing a transfusion.  Discussed with [facility] who will consult.  Hospitalist to admit. Final Clinical Impression(s) / ED Diagnoses Final diagnoses:  Acute GI bleeding    Rx / DC Orders ED Discharge Orders    None       Sherwood Gambler, MD 12/31/19 1555

## 2019-12-31 NOTE — Progress Notes (Signed)
Patient arrived to unit in NAD, VS stable and patient free from pain.  

## 2019-12-31 NOTE — ED Notes (Signed)
Update given to pts son, Roy David.

## 2019-12-31 NOTE — ED Notes (Signed)
Got patient on the monitor did ekg shown to Dr Goldston patient is resting with call bell in reach 

## 2019-12-31 NOTE — ED Triage Notes (Signed)
Pt arrives via EMS from home with reports of lower Gi bleeding since Saturday. Pt saw his PCP yesterday and had labs drawn. Pt vomited and had syncopal episode in triage.

## 2019-12-31 NOTE — Care Plan (Addendum)
-  Received consult from ER for possible rectal bleeding. -Chart reviewed briefly.  Patient's vital stable now.  Hemoglobin 9.9. -History of A. fib on Xarelto.  Recommendations ------------------------- -Get GI bleeding scan -Hold Eliquis -Start Protonix drip -GI will see patient later today or early tomorrow.  Otis Brace MD, [place] 12/31/2019, 2:33 PM  Contact #  [phone]

## 2020-01-01 DIAGNOSIS — K922 Gastrointestinal hemorrhage, unspecified: Secondary | ICD-10-CM | POA: Diagnosis not present

## 2020-01-01 LAB — CBC
HCT: 24 % — ABNORMAL LOW (ref 39.0–52.0)
Hemoglobin: 8 g/dL — ABNORMAL LOW (ref 13.0–17.0)
MCH: 32.9 pg (ref 26.0–34.0)
MCHC: 33.3 g/dL (ref 30.0–36.0)
MCV: 98.8 fL (ref 80.0–100.0)
Platelets: 232 10*3/uL (ref 150–400)
RBC: 2.43 MIL/uL — ABNORMAL LOW (ref 4.22–5.81)
RDW: 13 % (ref 11.5–15.5)
WBC: 9.3 10*3/uL (ref 4.0–10.5)
nRBC: 0 % (ref 0.0–0.2)

## 2020-01-01 LAB — BASIC METABOLIC PANEL
Anion gap: 10 (ref 5–15)
BUN: 29 mg/dL — ABNORMAL HIGH (ref 8–23)
CO2: 23 mmol/L (ref 22–32)
Calcium: 8.4 mg/dL — ABNORMAL LOW (ref 8.9–10.3)
Chloride: 102 mmol/L (ref 98–111)
Creatinine, Ser: 1.25 mg/dL — ABNORMAL HIGH (ref 0.61–1.24)
GFR calc Af Amer: 60 mL/min — ABNORMAL LOW (ref >60)
GFR calc non Af Amer: 51 mL/min — ABNORMAL LOW (ref >60)
Glucose, Bld: 130 mg/dL — ABNORMAL HIGH (ref 70–99)
Potassium: 4 mmol/L (ref 3.5–5.1)
Sodium: 135 mmol/L (ref 135–145)

## 2020-01-01 LAB — HEMOGLOBIN AND HEMATOCRIT, BLOOD
HCT: 23.2 % — ABNORMAL LOW (ref 39.0–52.0)
Hemoglobin: 7.9 g/dL — ABNORMAL LOW (ref 13.0–17.0)

## 2020-01-01 MED ORDER — PANTOPRAZOLE SODIUM 40 MG IV SOLR
40.0000 mg | Freq: Two times a day (BID) | INTRAVENOUS | Status: DC
  Administered 2020-01-02 – 2020-01-05 (×7): 40 mg via INTRAVENOUS
  Filled 2020-01-01 (×7): qty 40

## 2020-01-01 NOTE — Progress Notes (Signed)
Clarified that patient can be non-tele with Dr. Doristine Bosworth.

## 2020-01-01 NOTE — Consult Note (Signed)
Referring Provider: [facility] Primary Care Physician:  [name], L.Marlou Sa, MD Primary Gastroenterologist:  Dr. Cristina Gong  Reason for Consultation: Lower GI bleed  HPI: Roy David is a 86 y.o. male with past medical history of paroxysmal atrial fibrillation on Eliquis presented to the hospital with rectal bleeding.  He started having rectal bleeding last week.  Initially it resolved.  Again few days ago he started having multiple episodes of rectal bleeding.  Described as bright red blood per rectum.  Denied any associated abdominal pain.  Had one episode of nonbloody vomiting yesterday.  He also had dizziness and weakness.  Last colonoscopy in 2016 showed benign polyp and diverticulosis according to patient.  Report not reviewed.  GI is consulted for further evaluation.  Bleeding scan was recommended yesterday which was positive for possible bleeding at splenic flexure.  Follow-up angiogram showed no active bleeding.  Patient has not had any further bleeding today.  Past Medical History:  Diagnosis Date  . Arthritis   . Asthma    followed at [facility], stable on Asmanex  . Diverticulosis   . Dysrhythmia   . H/O seasonal allergies    Dr [name]  . Hypercholesteremia   . Hypertension   . Hypochromic-microcytic anemia   . Microcytic anemia   . Paroxysmal atrial fibrillation ([place]) 03/06/2016   on Xarelto  . Prostate cancer [facility])    12/10- Dr Dahlstedt/Dr Valere Dross  . Thyroid disease    hypothyroidism    Past Surgical History:  Procedure Laterality Date  . APPENDECTOMY    . EP IMPLANTABLE DEVICE N/A 11/29/2015   Procedure: Loop Recorder Insertion;  Surgeon: Sanda Klein, MD;  Location: [place] [facility];  Service: Cardiovascular;  Laterality: N/A;  . HERNIA REPAIR    . RETINAL DETACHMENT SURGERY      Prior to Admission medications   Medication Sig Start Date End Date Taking? Authorizing Provider  albuterol (VENTOLIN HFA) 108 (90 Base) MCG/ACT inhaler Inhale 1 puff into the lungs daily as  needed for wheezing or shortness of breath.  10/28/19 10/27/20 Yes [provider]  cholecalciferol (VITAMIN D) 1000 units tablet Take 1,000 Units by mouth daily.    Yes [provider]  co-enzyme Q-10 30 MG capsule Take 200 mg by mouth daily.   Yes [provider]  doxycycline (MONODOX) 100 MG capsule Take 100 mg by mouth 2 (two) times daily as needed.   Yes [provider]  ELIQUIS 5 MG TABS tablet TAKE 1 TABLET(5 MG) BY MOUTH TWICE DAILY Patient taking differently: Take 5 mg by mouth 2 (two) times daily.  10/22/19  Yes Croitoru, Mihai, MD  EPINEPHrine 0.3 mg/0.3 mL IJ SOAJ injection Inject 0.3 mg into the muscle once.   Yes [provider]  ezetimibe-simvastatin (VYTORIN) 10-20 MG per tablet Take 1 tablet by mouth daily.   Yes [provider]  ferrous sulfate 325 (65 FE) MG tablet Take 325 mg by mouth daily with breakfast.   Yes [provider]  gabapentin (NEURONTIN) 300 MG capsule Take 300 mg by mouth 2 (two) times daily.    Yes [provider]  GLUCOSAMINE-CHONDROITIN PO Take 1 tablet by mouth 2 (two) times daily.   Yes [provider]  levothyroxine (SYNTHROID, LEVOTHROID) 100 MCG tablet Take 100 mcg by mouth daily.  10/06/15  Yes [provider]  lisinopril-hydrochlorothiazide (PRINZIDE,ZESTORETIC) 20-25 MG tablet Take 1 tablet by mouth daily.   Yes [provider]  mometasone (ASMANEX 60 METERED DOSES) 220 MCG/INH inhaler Inhale 1  Inhaler into the lungs daily. 10/18/15  Yes [provider]  predniSONE (DELTASONE) 20 MG tablet Take 20 mg by mouth 2 (two) times daily as needed.  07/28/19  Yes [provider]  triamcinolone cream (KENALOG) 0.1 % Apply 1 application topically 2 (two) times daily as needed (itching).  10/01/19  Yes [provider]  vitamin B-12 (CYANOCOBALAMIN) 1000 MCG tablet Take 1,000 mcg by mouth daily.   Yes [provider]    Scheduled  Meds: . ezetimibe-simvastatin  1 tablet Oral Daily  . ferrous sulfate  325 mg Oral Q breakfast  . fluticasone  1 spray Each Nare Daily  . gabapentin  300 mg Oral BID  . levothyroxine  100 mcg Oral Daily  . [START ON 01/04/2020] pantoprazole  40 mg Intravenous Q12H  . predniSONE  20 mg Oral BID  . sodium chloride flush  3 mL Intravenous Q12H   Continuous Infusions: . lactated ringers 75 mL/hr at 01/01/20 0546  . pantoprozole (PROTONIX) infusion 8 mg/hr (01/01/20 0240)   PRN Meds:.acetaminophen **OR** acetaminophen, albuterol, ondansetron **OR** ondansetron (ZOFRAN) IV  Allergies as of 12/31/2019 - Review Complete 12/31/2019  Allergen Reaction Noted  . Bee venom Nausea Only and Other (See Comments) 11/25/2015    Family History  Problem Relation Age of Onset  . Heart attack Mother   . Stroke Father   . Stroke Sister     Social History   Socioeconomic History  . Marital status: Married    Spouse name: Not on file  . Number of children: Not on file  . Years of education: Not on file  . Highest education level: Not on file  Occupational History  . Occupation: retired  Tobacco Use  . Smoking status: Former Smoker    Quit date: 12/09/1959    Years since quitting: 60.1  . Smokeless tobacco: Never Used  Substance and Sexual Activity  . Alcohol use: Yes    Alcohol/week: 14.0 standard drinks    Types: 7 Shots of liquor, 7 Cans of beer per week  . Drug use: No  . Sexual activity: Not on file  Other Topics Concern  . Not on file  Social History Narrative   Tobacco use cigarettes : Former smoker, Quit in year 1955   Occupation : retired/  married   Social Determinants of Radio broadcast assistant Strain:   . Difficulty of Paying Living Expenses: Not on file  Food Insecurity:   . Worried About Charity fundraiser in the Last Year: Not on file  . Ran Out of Food in the Last Year: Not on file  Transportation Needs:   . Lack of Transportation (Medical): Not on file  .  Lack of Transportation (Non-Medical): Not on file  Physical Activity:   . Days of Exercise per Week: Not on file  . Minutes of Exercise per Session: Not on file  Stress:   . Feeling of Stress : Not on file  Social Connections:   . Frequency of Communication with Friends and Family: Not on file  . Frequency of Social Gatherings with Friends and Family: Not on file  . Attends Religious Services: Not on file  . Active Member of Clubs or Organizations: Not on file  . Attends Archivist Meetings: Not on file  . Marital Status: Not on file  Intimate Partner Violence:   . Fear of Current or Ex-Partner: Not on file  . Emotionally Abused: Not on file  . Physically Abused: Not  on file  . Sexually Abused: Not on file    Review of Systems: Review of Systems  Constitutional: Negative for chills and fever.  HENT: Negative for hearing loss and tinnitus.   Eyes: Negative for blurred vision and double vision.  Respiratory: Negative for cough and hemoptysis.   Cardiovascular: Negative for chest pain and palpitations.  Gastrointestinal: Positive for blood in stool, nausea and vomiting. Negative for abdominal pain and melena.  Genitourinary: Negative for dysuria and urgency.  Musculoskeletal: Negative for myalgias and neck pain.  Skin: Negative for itching and rash.  Neurological: Positive for dizziness. Negative for seizures.  Endo/Heme/Allergies: Bruises/bleeds easily.  Psychiatric/Behavioral: Negative for suicidal ideas. The patient is not nervous/anxious.     Physical Exam: Vital signs: Vitals:   01/01/20 0544 01/01/20 0910  BP: 107/63 139/78  Pulse: 92 84  Resp: 18 18  Temp: 99.1 F (37.3 C) 98.1 F (36.7 C)  SpO2: 98% 98%   Last BM Date: 12/31/19 Physical Exam  Constitutional: He is oriented to person, place, and time. He appears well-developed and well-nourished. No distress.  Elderly  HENT:  Head: Normocephalic and atraumatic.  Eyes: EOM are normal. No scleral  icterus.  Cardiovascular: Normal rate, regular rhythm and normal heart sounds.  Pulmonary/Chest: Effort normal. No respiratory distress.  Abdominal: Soft. Bowel sounds are normal. He exhibits no distension. There is no abdominal tenderness. There is no rebound and no guarding.  Musculoskeletal:        General: No edema. Normal range of motion.     Cervical back: Normal range of motion and neck supple.  Neurological: He is alert and oriented to person, place, and time.  Skin: Skin is dry. No erythema.  Psychiatric: He has a normal mood and affect. Judgment and thought content normal.   GI:  Lab Results: Recent Labs    12/31/19 1058 12/31/19 1058 12/31/19 1115 12/31/19 2022 01/01/20 0710  WBC 12.8*  --   --  11.7* 9.3  HGB 9.8*   < > 9.9* 8.8* 8.0*  HCT 29.8*   < > 29.0* 26.0* 24.0*  PLT 257  --   --  233 232   < > = values in this interval not displayed.   BMET Recent Labs    12/31/19 1058 12/31/19 1115 01/01/20 0710  NA 134* 137 135  K 3.7 3.7 4.0  CL 102 102 102  CO2 22  --  23  GLUCOSE 156* 151* 130*  BUN 33* 30* 29*  CREATININE 1.40* 1.40* 1.25*  CALCIUM 8.8*  --  8.4*   LFT Recent Labs    12/31/19 1058  PROT 5.6*  ALBUMIN 3.2*  AST 25  ALT 16  ALKPHOS 48  BILITOT 0.7   PT/INR Recent Labs    12/31/19 1058  LABPROT 13.8  INR 1.1     Studies/Results: NM GI Blood Loss  Addendum Date: 12/31/2019   ADDENDUM REPORT: 12/31/2019 19:16 ADDENDUM: These results were called by telephone at the time of interpretation on 12/31/2019 at 7:16 pm to provider Hayden Pedro, who verbally acknowledged these results. Electronically Signed   By: Constance Holster M.D.   On: 12/31/2019 19:16   Result Date: 12/31/2019 CLINICAL DATA:  GI bleeding EXAM: NUCLEAR MEDICINE GASTROINTESTINAL BLEEDING SCAN TECHNIQUE: Sequential abdominal images were obtained following intravenous administration of Tc-30m labeled red blood cells. RADIOPHARMACEUTICALS:  Twenty-five mCi Tc-39m  pertechnetate in-vitro labeled red cells. COMPARISON:  None. FINDINGS: There is accumulation of radiotracer in the left upper quadrant in the region  of the splenic flexure, early on the study. There is limited flow of radiotracer downstream from this point in the first hour. On the second hour of the study, there is increasing accumulation of radiotracer in the left upper quadrant at the level of the splenic flexure with some vague downstream peristalsis. IMPRESSION: Study is positive for active GI bleeding in the left upper quadrant, likely at the level of the splenic flexure of the colon. Electronically Signed: By: Constance Holster M.D. On: 12/31/2019 19:06   CT Angio Abd/Pel w/ and/or w/o  Result Date: 12/31/2019 CLINICAL DATA:  GI bleed EXAM: CTA ABDOMEN AND PELVIS WITHOUT AND WITH CONTRAST TECHNIQUE: Multidetector CT imaging of the abdomen and pelvis was performed using the standard protocol during bolus administration of intravenous contrast. Multiplanar reconstructed images and MIPs were obtained and reviewed to evaluate the vascular anatomy. CONTRAST:  30mL OMNIPAQUE IOHEXOL 350 MG/ML SOLN COMPARISON:  None. FINDINGS: VASCULAR Aorta: Atherosclerotic changes are noted throughout the abdominal aorta without evidence for stenosis or aneurysmal dilatation. Celiac: There is mild-to-moderate narrowing of the proximal celiac axis with some poststenotic dilatation. The splenic artery is unremarkable. The hepatic arterial anatomy appears to be normal. SMA: Patent without evidence of aneurysm, dissection, vasculitis or significant stenosis. Renals: Both renal arteries are patent without evidence of aneurysm, dissection, vasculitis, fibromuscular dysplasia or significant stenosis. IMA: Patent without evidence of aneurysm, dissection, vasculitis or significant stenosis. Inflow: Patent without evidence of aneurysm, dissection, vasculitis or significant stenosis. Proximal Outflow: Bilateral common femoral and  visualized portions of the superficial and profunda femoral arteries are patent without evidence of aneurysm, dissection, vasculitis or significant stenosis. Veins: No obvious venous abnormality within the limitations of this arterial phase study. Review of the MIP images confirms the above findings. NON-VASCULAR Lower chest: There is a 1.1 cm airspace opacity at the left lung base (axial series 6, image 13).The heart size is normal. Hepatobiliary: There is a small hypoattenuating nodule within the right hepatic lobe that is favored to represent a benign cyst. Normal gallbladder.There is no biliary ductal dilation. Pancreas: There is a 2.1 x 1.3 cm cystic mass in the pancreatic neck. Spleen: No splenic laceration or hematoma. Adrenals/Urinary Tract: --Adrenal glands: No adrenal hemorrhage. --Right kidney/ureter: No hydronephrosis or perinephric hematoma. --Left kidney/ureter: No hydronephrosis or perinephric hematoma. --Urinary bladder: Unremarkable. Stomach/Bowel: --Stomach/Duodenum: No hiatal hernia or other gastric abnormality. Normal duodenal course and caliber. --Small bowel: No dilatation or inflammation. --Colon: There is severe diverticulosis involving the sigmoid colon, descending colon, and distal transverse colon. There is hyperdense intraluminal contents in the splenic flexure of the colon. There is no evidence for active arterial extravasation on this study. --Appendix: Normal. Lymphatic: --No retroperitoneal lymphadenopathy. --No mesenteric lymphadenopathy. --No pelvic or inguinal lymphadenopathy. Reproductive: Brachytherapy beads are noted in the prostate gland. Other: No ascites or free air. The abdominal wall is normal. Musculoskeletal. Moderate to severe degenerative changes are noted of the lower lumbar spine. There is no acute displaced fracture. IMPRESSION: 1. No evidence for active arterial GI bleeding on this study. 2. Hyperdense intraluminal fluid is noted in the region of the splenic  flexure. This could represent blood products and corresponds well to the prior GI bleeding study. However, again there is no evidence for active arterial extravasation on this study. 3. Severe diverticulosis as detailed above. 4. There is a 2.1 cm cystic mass in the neck of the pancreas. Recommend follow up pre and post contrast MRI/MRCP or pancreatic protocol CT in 2 years. This recommendation follows ACR  consensus guidelines: Management of Incidental Pancreatic Cysts: A White Paper of the ACR Incidental Findings Committee. J Am Coll Radiol [id number]. 5. There is a 1.1 cm airspace opacity in the left lower lobe. A follow-up CT of the chest in 3 months is recommended for further evaluation of this finding. 6.  Aortic Atherosclerosis (ICD10-I70.0). Electronically Signed   By: Constance Holster M.D.   On: 12/31/2019 21:16    Impression/Plan: -Painless hematochezia.  Most likely diverticular bleed.  Bleeding scan was positive for bleeding at splenic flexure.  Follow-up CTA showed no evidence of active bleeding. -Acute blood loss anemia -Abnormal CT scan concerning for 2.1 cm cystic mass in the neck of the pancreas.  Repeat MRI or CT scan recommended in 2 years -Paroxysmal atrial fibrillation.  Eliquis on hold  Recommendations ----------------------- -No further bleeding episodes. -Start full liquid diet.  Slowly advance as tolerated. -If ongoing bleeding, recommend colonoscopy for further evaluation. -If anticoagulation clinically indicated, recommend to start with heparin drip.  May need to consider discussion about stopping anticoagulation if patient is at low risk for embolic events. -Monitor H&H.  GI will follow    LOS: 0 days   Otis Brace  MD, FACP 01/01/2020, 12:32 PM  Contact #  [phone]

## 2020-01-01 NOTE — Progress Notes (Addendum)
,  PROGRESS Roy David  [id number] DOB: 1930/11/09 DOA: 12/31/2019 PCP: [name], L.Marlou Sa, MD   Brief Narrative:  HPI: Roy David is a 86 y.o. male with medical history significant of prostate CA; PAF on Xarelto; HTN; AS; and HLD presenting with lower GI bleeding/syncopal episode in triage.  The patient reports that he had BRBPR on Saturday.  He had intermittent bleeding through the week and then recurrence this AM with 4+ episodes at home and then a syncopal episode.  He feels better now but has had a couple of episodes of "gas" that he hopes is not not ongoing bloody stools.    ED Course: Lower GI bleed with syncope.  Hgb 9, dark red blood Saturday and then again today.  Vomited darker stuff in triage, will check for blood.  Calling GI.  Assessment & Plan:   Principal Problem:   Acute lower GI bleeding Active Problems:   Essential hypertension   Hypercholesterolemia   Paroxysmal atrial fibrillation (HCC)   Acute lower GI bleeding/acute blood loss anemia: Per patient, his last rectal bleeding was yesterday around 11 AM.  He has not had any further bleeding since then.  Hemoglobin dropped from 9.8-8.0.  No indication of transfusion.  Repeat H&H later today.  CBC scan shows active bleeding at left splenic flexure.  CT angiogram of abdomen and pelvis unremarkable.  He is on Protonix.  Continue to hold Eliquis.  GI on board.  Defer management to them.  Essential hypertension: Blood pressure fairly controlled.  Continue to hold lisinopril and hydrochlorothiazide.  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  Plan of care discussed with patient in length and he verbalized understanding and agreed with it. Disposition Plan: We will likely be discharged home once medically improved and cleared by GI.  Potentially in 24 to 48 hours.  Estimated body mass index is 24.69 kg/m as calculated from the following:   Height as of 01/16/19: 5' 8.5" (1.74 m).   Weight as of 01/16/19: 74.8 kg.      Nutritional status:               Consultants:   GI  Procedures:   None  Antimicrobials:   None   Subjective: Seen and examined.  Feels better.  Has not had any rectal bleeding since 11 AM yesterday.  No complaints.  Objective: Vitals:   12/31/19 1413 12/31/19 2157 01/01/20 0544 01/01/20 0910  BP: 133/86 120/80 107/63 139/78  Pulse: 81 89 92 84  Resp: 18 16 18 18   Temp: 97.8 F (36.6 C) 98.3 F (36.8 C) 99.1 F (37.3 C) 98.1 F (36.7 C)  TempSrc: Oral Oral Oral Oral  SpO2: 99% 99% 98% 98%    Intake/Output Summary (Last 24 hours) at 01/01/2020 1131 Last data filed at 12/31/2019 1308 Gross per 24 hour  Intake 500 ml  Output --  Net 500 ml   There were no vitals filed for this visit.  Examination:  General exam: Appears calm and comfortable  Respiratory system: Clear to auscultation. Respiratory effort normal. Cardiovascular system: S1 & S2 heard,  RRR. No JVD, murmurs, rubs, gallops or clicks. No pedal edema. Gastrointestinal system: Abdomen is nondistended, soft and nontender. No organomegaly or masses felt. Normal bowel sounds heard. Central nervous system: Alert and oriented. No focal neurological deficits. Extremities: Symmetric 5 x 5 power. Skin: No rashes, lesions or ulcers Psychiatry: Judgement and insight appear normal. Mood & affect appropriate.    Data Reviewed: I have personally reviewed following labs and imaging studies  CBC: Recent Labs  Lab 12/31/19 1058 12/31/19 1115  12/31/19 2022 01/01/20 0710  WBC 12.8*  --  11.7* 9.3  NEUTROABS 10.6*  --   --   --   HGB 9.8* 9.9* 8.8* 8.0*  HCT 29.8* 29.0* 26.0* 24.0*  MCV 100.3*  --  98.1 98.8  PLT 257  --  233 [id number]   Basic Metabolic Panel: Recent Labs  Lab 12/31/19 1058 12/31/19 1115 01/01/20 0710  NA 134* 137 135  K 3.7 3.7 4.0  CL 102 102 102  CO2 22  --  23  GLUCOSE 156* 151* 130*  BUN 33* 30* 29*  CREATININE 1.40* 1.40* 1.25*  CALCIUM 8.8*  --  8.4*   GFR: CrCl cannot be calculated (Unknown ideal weight.). Liver Function Tests: Recent Labs  Lab 12/31/19 1058  AST 25  ALT 16  ALKPHOS 48  BILITOT 0.7  PROT 5.6*  ALBUMIN 3.2*   No results for input(s): LIPASE, AMYLASE in the last 168 hours. No results for input(s): AMMONIA in the last 168 hours. Coagulation Profile: Recent Labs  Lab 12/31/19 1058  INR 1.1   Cardiac Enzymes: No results for input(s): CKTOTAL, CKMB, CKMBINDEX, TROPONINI in the last 168 hours. BNP (last 3 results) No results for input(s): PROBNP in the last 8760 hours. HbA1C: No results for input(s): HGBA1C in the last 72 hours. CBG: No results for input(s): GLUCAP in the last 168 hours. Lipid Profile: No results for input(s): CHOL, HDL, LDLCALC, TRIG, CHOLHDL, LDLDIRECT in the last 72 hours. Thyroid Function Tests: No results for input(s): TSH, T4TOTAL, FREET4, T3FREE, THYROIDAB in the last 72 hours. Anemia Panel: No results for input(s): VITAMINB12, FOLATE, FERRITIN, TIBC, IRON, RETICCTPCT in the last 72 hours. Sepsis Labs: No results for input(s): PROCALCITON, LATICACIDVEN in the last 168 hours.  Recent Results (from the past 240 hour(s))  Respiratory Panel by RT PCR (Flu A&B, Covid) - Nasopharyngeal Swab     Status: None   Collection Time: 12/31/19 11:19 AM   Specimen: Nasopharyngeal Swab  Result Value Ref Range Status   SARS Coronavirus 2 by RT PCR NEGATIVE NEGATIVE Final    Comment: (NOTE) SARS-CoV-2 target nucleic acids are NOT DETECTED. The  SARS-CoV-2 RNA is generally detectable in upper respiratoy specimens during the acute phase of infection. The lowest concentration of SARS-CoV-2 viral copies this assay can detect is 131 copies/mL. A negative result does not preclude SARS-Cov-2 infection and should not be used as the sole basis for treatment or other patient management decisions. A negative result may occur with  improper specimen collection/handling, submission of specimen other than nasopharyngeal swab, presence of viral mutation(s) within the areas targeted by this assay, and inadequate number of viral copies (<131 copies/mL). A negative result must be combined with clinical observations, patient history, and epidemiological information. The expected result is Negative. Fact Sheet for Patients:  PinkCheek.be Fact Sheet for Healthcare Providers:  GravelBags.it This test is not yet ap proved or cleared by the [place] FDA and  has been authorized for detection and/or diagnosis of SARS-CoV-2 by  FDA under an Emergency Use Authorization (EUA). This EUA will remain  in effect (meaning this test can be used) for the duration of the COVID-19 declaration under Section 564(b)(1) of the Act, 21 U.S.C. section 360bbb-3(b)(1), unless the authorization is terminated or revoked sooner.    Influenza A by PCR NEGATIVE NEGATIVE Final   Influenza B by PCR NEGATIVE NEGATIVE Final    Comment: (NOTE) The Xpert Xpress SARS-CoV-2/FLU/RSV assay is intended as an aid in  the diagnosis of influenza from Nasopharyngeal swab specimens and  should not be used as a sole basis for treatment. Nasal washings and  aspirates are unacceptable for Xpert Xpress SARS-CoV-2/FLU/RSV  testing. Fact Sheet for Patients: PinkCheek.be Fact Sheet for Healthcare Providers: GravelBags.it This test is not yet approved or cleared by the [place] FDA and  has been authorized for detection and/or diagnosis of SARS-CoV-2 by  FDA under an Emergency Use Authorization (EUA). This EUA will remain  in effect (meaning this test can be used) for the duration of the  Covid-19 declaration under Section 564(b)(1) of the Act, 21  U.S.C. section 360bbb-3(b)(1), unless the authorization is  terminated or revoked. Performed at [facility], [place] [address], [place], [place] [address]       Radiology Studies: NM GI Blood Loss  Addendum Date: 12/31/2019   ADDENDUM REPORT: 12/31/2019 19:16 ADDENDUM: These results were called by telephone at the time of interpretation on 12/31/2019 at 7:16 pm to provider Hayden Pedro, who verbally acknowledged these results. Electronically Signed   By: Constance Holster M.D.   On: 12/31/2019 19:16   Result Date: 12/31/2019 CLINICAL DATA:  GI bleeding EXAM: NUCLEAR MEDICINE GASTROINTESTINAL BLEEDING SCAN TECHNIQUE: Sequential abdominal images were obtained following intravenous administration of Tc-77m labeled red blood cells. RADIOPHARMACEUTICALS:  Twenty-five mCi Tc-24m pertechnetate in-vitro labeled red cells. COMPARISON:  None. FINDINGS: There is accumulation of radiotracer in the left upper quadrant in the region of the splenic flexure, early on the study. There is limited flow of radiotracer downstream from this point in the first hour. On the second hour of the study, there is increasing accumulation of radiotracer in the left upper quadrant at the level of the splenic flexure with some vague downstream peristalsis. IMPRESSION: Study is positive for active GI bleeding in the left upper quadrant, likely at the level of the splenic flexure of the colon. Electronically Signed: By: Constance Holster M.D. On: 12/31/2019 19:06   CT Angio Abd/Pel w/ and/or w/o  Result Date: 12/31/2019 CLINICAL DATA:  GI bleed EXAM: CTA ABDOMEN AND PELVIS WITHOUT AND WITH CONTRAST TECHNIQUE: Multidetector CT imaging of the  abdomen and pelvis was performed using the standard protocol during bolus administration of intravenous contrast. Multiplanar reconstructed images and MIPs were obtained and reviewed to evaluate the vascular anatomy. CONTRAST:  81mL OMNIPAQUE IOHEXOL 350 MG/ML SOLN COMPARISON:  None. FINDINGS: VASCULAR Aorta: Atherosclerotic changes are noted throughout the abdominal aorta without evidence for stenosis or aneurysmal dilatation. Celiac: There is mild-to-moderate narrowing of the proximal celiac axis with some poststenotic dilatation. The splenic artery is unremarkable. The hepatic arterial anatomy appears to be normal. SMA: Patent without evidence of aneurysm, dissection, vasculitis or significant stenosis. Renals: Both renal arteries are patent without evidence of aneurysm, dissection, vasculitis, fibromuscular dysplasia or significant stenosis. IMA: Patent without evidence of aneurysm, dissection, vasculitis or significant stenosis. Inflow: Patent without evidence of aneurysm, dissection, vasculitis or significant stenosis. Proximal Outflow: Bilateral common femoral and visualized portions of the superficial and profunda femoral  arteries are patent without evidence of aneurysm, dissection, vasculitis or significant stenosis. Veins: No obvious venous abnormality within the limitations of this arterial phase study. Review of the MIP images confirms the above findings. NON-VASCULAR Lower chest: There is a 1.1 cm airspace opacity at the left lung base (axial series 6, image 13).The heart size is normal. Hepatobiliary: There is a small hypoattenuating nodule within the right hepatic lobe that is favored to represent a benign cyst. Normal gallbladder.There is no biliary ductal dilation. Pancreas: There is a 2.1 x 1.3 cm cystic mass in the pancreatic neck. Spleen: No splenic laceration or hematoma. Adrenals/Urinary Tract: --Adrenal glands: No adrenal hemorrhage. --Right kidney/ureter: No hydronephrosis or perinephric  hematoma. --Left kidney/ureter: No hydronephrosis or perinephric hematoma. --Urinary bladder: Unremarkable. Stomach/Bowel: --Stomach/Duodenum: No hiatal hernia or other gastric abnormality. Normal duodenal course and caliber. --Small bowel: No dilatation or inflammation. --Colon: There is severe diverticulosis involving the sigmoid colon, descending colon, and distal transverse colon. There is hyperdense intraluminal contents in the splenic flexure of the colon. There is no evidence for active arterial extravasation on this study. --Appendix: Normal. Lymphatic: --No retroperitoneal lymphadenopathy. --No mesenteric lymphadenopathy. --No pelvic or inguinal lymphadenopathy. Reproductive: Brachytherapy beads are noted in the prostate gland. Other: No ascites or free air. The abdominal wall is normal. Musculoskeletal. Moderate to severe degenerative changes are noted of the lower lumbar spine. There is no acute displaced fracture. IMPRESSION: 1. No evidence for active arterial GI bleeding on this study. 2. Hyperdense intraluminal fluid is noted in the region of the splenic flexure. This could represent blood products and corresponds well to the prior GI bleeding study. However, again there is no evidence for active arterial extravasation on this study. 3. Severe diverticulosis as detailed above. 4. There is a 2.1 cm cystic mass in the neck of the pancreas. Recommend follow up pre and post contrast MRI/MRCP or pancreatic protocol CT in 2 years. This recommendation follows ACR consensus guidelines: Management of Incidental Pancreatic Cysts: A White Paper of the ACR Incidental Findings Committee. J Am Coll Radiol [id number]. 5. There is a 1.1 cm airspace opacity in the left lower lobe. A follow-up CT of the chest in 3 months is recommended for further evaluation of this finding. 6.  Aortic Atherosclerosis (ICD10-I70.0). Electronically Signed   By: Constance Holster M.D.   On: 12/31/2019 21:16    Scheduled Meds: .  ezetimibe-simvastatin  1 tablet Oral Daily  . ferrous sulfate  325 mg Oral Q breakfast  . fluticasone  1 spray Each Nare Daily  . gabapentin  300 mg Oral BID  . levothyroxine  100 mcg Oral Daily  . [START ON 01/04/2020] pantoprazole  40 mg Intravenous Q12H  . predniSONE  20 mg Oral BID  . sodium chloride flush  3 mL Intravenous Q12H   Continuous Infusions: . lactated ringers 75 mL/hr at 01/01/20 0546  . pantoprozole (PROTONIX) infusion 8 mg/hr (01/01/20 0240)     LOS: 0 days   Time spent: 32 minutes   Roy Cheney, MD [facility]  01/01/2020, 11:31 AM   To contact the attending provider between 7A-7P or the covering provider during after hours 7P-7A, please log into the web site [email or web address].

## 2020-01-02 ENCOUNTER — Inpatient Hospital Stay (HOSPITAL_COMMUNITY): Payer: Medicare Other

## 2020-01-02 DIAGNOSIS — Z79890 Hormone replacement therapy (postmenopausal): Secondary | ICD-10-CM | POA: Diagnosis not present

## 2020-01-02 DIAGNOSIS — Z95818 Presence of other cardiac implants and grafts: Secondary | ICD-10-CM | POA: Diagnosis not present

## 2020-01-02 DIAGNOSIS — E7800 Pure hypercholesterolemia, unspecified: Secondary | ICD-10-CM | POA: Diagnosis present

## 2020-01-02 DIAGNOSIS — Z823 Family history of stroke: Secondary | ICD-10-CM | POA: Diagnosis not present

## 2020-01-02 DIAGNOSIS — Z8546 Personal history of malignant neoplasm of prostate: Secondary | ICD-10-CM | POA: Diagnosis not present

## 2020-01-02 DIAGNOSIS — I10 Essential (primary) hypertension: Secondary | ICD-10-CM | POA: Diagnosis present

## 2020-01-02 DIAGNOSIS — D62 Acute posthemorrhagic anemia: Secondary | ICD-10-CM | POA: Diagnosis present

## 2020-01-02 DIAGNOSIS — Z7952 Long term (current) use of systemic steroids: Secondary | ICD-10-CM | POA: Diagnosis not present

## 2020-01-02 DIAGNOSIS — Z91030 Bee allergy status: Secondary | ICD-10-CM | POA: Diagnosis not present

## 2020-01-02 DIAGNOSIS — Z87891 Personal history of nicotine dependence: Secondary | ICD-10-CM | POA: Diagnosis not present

## 2020-01-02 DIAGNOSIS — Z20822 Contact with and (suspected) exposure to covid-19: Secondary | ICD-10-CM | POA: Diagnosis present

## 2020-01-02 DIAGNOSIS — K922 Gastrointestinal hemorrhage, unspecified: Secondary | ICD-10-CM | POA: Diagnosis present

## 2020-01-02 DIAGNOSIS — J45909 Unspecified asthma, uncomplicated: Secondary | ICD-10-CM | POA: Diagnosis present

## 2020-01-02 DIAGNOSIS — Z7901 Long term (current) use of anticoagulants: Secondary | ICD-10-CM | POA: Diagnosis not present

## 2020-01-02 DIAGNOSIS — Z79899 Other long term (current) drug therapy: Secondary | ICD-10-CM | POA: Diagnosis not present

## 2020-01-02 DIAGNOSIS — E785 Hyperlipidemia, unspecified: Secondary | ICD-10-CM | POA: Diagnosis present

## 2020-01-02 DIAGNOSIS — R55 Syncope and collapse: Secondary | ICD-10-CM | POA: Diagnosis not present

## 2020-01-02 DIAGNOSIS — I480 Paroxysmal atrial fibrillation: Secondary | ICD-10-CM | POA: Diagnosis present

## 2020-01-02 DIAGNOSIS — R072 Precordial pain: Secondary | ICD-10-CM | POA: Diagnosis not present

## 2020-01-02 DIAGNOSIS — Z8249 Family history of ischemic heart disease and other diseases of the circulatory system: Secondary | ICD-10-CM | POA: Diagnosis not present

## 2020-01-02 DIAGNOSIS — K5731 Diverticulosis of large intestine without perforation or abscess with bleeding: Secondary | ICD-10-CM | POA: Diagnosis present

## 2020-01-02 DIAGNOSIS — E039 Hypothyroidism, unspecified: Secondary | ICD-10-CM | POA: Diagnosis present

## 2020-01-02 LAB — CBC
HCT: 22 % — ABNORMAL LOW (ref 39.0–52.0)
HCT: 22.9 % — ABNORMAL LOW (ref 39.0–52.0)
Hemoglobin: 7.4 g/dL — ABNORMAL LOW (ref 13.0–17.0)
Hemoglobin: 7.8 g/dL — ABNORMAL LOW (ref 13.0–17.0)
MCH: 33 pg (ref 26.0–34.0)
MCH: 33.2 pg (ref 26.0–34.0)
MCHC: 33.6 g/dL (ref 30.0–36.0)
MCHC: 34.1 g/dL (ref 30.0–36.0)
MCV: 97.4 fL (ref 80.0–100.0)
MCV: 98.2 fL (ref 80.0–100.0)
Platelets: 229 10*3/uL (ref 150–400)
Platelets: 271 10*3/uL (ref 150–400)
RBC: 2.24 MIL/uL — ABNORMAL LOW (ref 4.22–5.81)
RBC: 2.35 MIL/uL — ABNORMAL LOW (ref 4.22–5.81)
RDW: 12.9 % (ref 11.5–15.5)
RDW: 13.2 % (ref 11.5–15.5)
WBC: 13.1 10*3/uL — ABNORMAL HIGH (ref 4.0–10.5)
WBC: 9 10*3/uL (ref 4.0–10.5)
nRBC: 0 % (ref 0.0–0.2)
nRBC: 0 % (ref 0.0–0.2)

## 2020-01-02 LAB — BASIC METABOLIC PANEL
Anion gap: 8 (ref 5–15)
BUN: 27 mg/dL — ABNORMAL HIGH (ref 8–23)
CO2: 25 mmol/L (ref 22–32)
Calcium: 8.7 mg/dL — ABNORMAL LOW (ref 8.9–10.3)
Chloride: 104 mmol/L (ref 98–111)
Creatinine, Ser: 1.34 mg/dL — ABNORMAL HIGH (ref 0.61–1.24)
GFR calc Af Amer: 55 mL/min — ABNORMAL LOW (ref >60)
GFR calc non Af Amer: 47 mL/min — ABNORMAL LOW (ref >60)
Glucose, Bld: 143 mg/dL — ABNORMAL HIGH (ref 70–99)
Potassium: 3.8 mmol/L (ref 3.5–5.1)
Sodium: 137 mmol/L (ref 135–145)

## 2020-01-02 LAB — GLUCOSE, CAPILLARY: Glucose-Capillary: 154 mg/dL — ABNORMAL HIGH (ref 70–99)

## 2020-01-02 LAB — HEMOGLOBIN AND HEMATOCRIT, BLOOD
HCT: 24.8 % — ABNORMAL LOW (ref 39.0–52.0)
HCT: 24.3 % — ABNORMAL LOW (ref 39.0–52.0)
Hemoglobin: 8.3 g/dL — ABNORMAL LOW (ref 13.0–17.0)
Hemoglobin: 8.1 g/dL — ABNORMAL LOW (ref 13.0–17.0)

## 2020-01-02 MED ORDER — MORPHINE SULFATE (PF) 2 MG/ML IV SOLN
2.0000 mg | Freq: Once | INTRAVENOUS | Status: AC
  Administered 2020-01-03: 2 mg via INTRAVENOUS
  Filled 2020-01-02: qty 1

## 2020-01-02 NOTE — Progress Notes (Addendum)
Received a page from RN that right before leaving, patient had another rectal bleeding and that patient was not comfortable going home.  Also that patient's wife was informed about patient's bleeding and that she was upset claiming that my medical team has not been able to fix his problems.  She also requested a phone call from physician.  I advised RN to let GI know about the bleeding and also asked them to touch base with the wife as her questions are regarding GI bleeding which GI can answer better than hospitalist.  I then personally called patient's wife Roy David at the phone number provided in the chart [phone] and left a voicemail.  Since patient is not comfortable going home so discharge will be canceled.  I will check his H&H again at 5 PM.

## 2020-01-02 NOTE — Discharge Instructions (Signed)
Gastrointestinal Bleeding Gastrointestinal (GI) bleeding is bleeding somewhere along the digestive tract, between the mouth and the anus. This tract includes the mouth, esophagus, stomach, small intestine, large intestine, and anus. The large intestine is often called the colon. GI bleeding can be caused by various problems. The severity of these problems can range from mild to serious or even life-threatening. If you have GI bleeding, you may find blood in your stools (feces), you may have black stools, or you may vomit blood. If there is a lot of bleeding, you may need to stay in the hospital. What are the causes? This condition may be caused by:  Inflammation, irritation, or swelling of the esophagus (esophagitis). The esophagus is part of the body that moves food from your mouth to your stomach.  Swollen veins in the rectum (hemorrhoids).  Areas of painful tearing in the anus that are often caused by passing hard stool (anal fissures).  Pouches that form on the colon over time, with age, and may bleed a lot (diverticulosis).  Inflammation (diverticulitis) in areas with diverticulosis. This can cause pain, fever, and bloody stools, although bleeding may be mild.  Growths (polyps) or cancer. Colon cancer often starts out as precancerous polyps.  Gastritis and ulcers. With these, bleeding may come from the upper GI tract, near the stomach. What increases the risk? You are more likely to develop this condition if you:  Have an infection in your stomach from a type of bacteria called Helicobacter pylori.  Take certain medicines, such as: ? NSAIDs. ? Aspirin. ? Selective serotonin reuptake inhibitors (SSRIs). ? Steroids. ? Antiplatelet or anticoagulant medicines.  Smoke.  Drink alcohol. What are the signs or symptoms? Common symptoms of this condition include:  Bright red blood in your vomit, or vomit that looks like coffee grounds.  Bloody, black, or tarry stools. ? Bleeding  from the lower GI tract will usually cause red or maroon blood in the stools. ? Bleeding from the upper GI tract may cause black, tarry stools that are often stronger smelling than usual. ? In certain cases, if the bleeding is fast enough, the stools may be red.  Pain or cramping in the abdomen. How is this diagnosed? This condition may be diagnosed based on:  Your medical history and a physical exam.  Various tests, such as: ? Blood tests. ? Stool tests. ? X-rays and other imaging tests. ? Esophagogastroduodenoscopy (EGD). In this test, a flexible, lighted tube is used to look at your esophagus, stomach, and small intestine. ? Colonoscopy. In this test, a flexible, lighted tube is used to look at your colon. How is this treated? Treatment for this condition depends on the cause of the bleeding. For example:  For bleeding from the esophagus, stomach, small intestine, or colon, the health care provider doing your EGD or colonoscopy may be able to stop the bleeding as part of the procedure.  Inflammation or infection of the colon can be treated with medicines.  Certain rectal problems can be treated with creams, suppositories, or warm baths.  Medicines may be given to reduce acid in your stomach.  Surgery is sometimes needed.  Blood transfusions are sometimes needed if a lot of blood has been lost. If bleeding is mild, you may be allowed to go home. If there is a lot of bleeding, you will need to stay in the hospital for observation. Follow these instructions at home:   Take over-the-counter and prescription medicines only as told by your health care provider.    Eat foods that are high in fiber, such as beans, whole grains, and fresh fruits and vegetables. This will help to keep your stools soft. Eating 1-3 prunes each day works well for many people.  Drink enough fluid to keep your urine pale yellow.  Keep all follow-up visits as told by your health care provider. This is  important. Contact a health care provider if:  Your symptoms do not improve. Get help right away if:  Your bleeding does not stop.  You feel light-headed or you faint.  You feel weak.  You have severe cramps in your back or abdomen.  You pass large blood clots in your stool.  Your symptoms are getting worse.  You have chest pain or fast heartbeats. Summary  Gastrointestinal (GI) bleeding is bleeding somewhere along the digestive tract, between the mouth and anus. GI bleeding can be caused by various problems. The severity of these problems can range from mild to serious or even life-threatening.  Treatment for this condition depends on the cause of the bleeding.  Take over-the-counter and prescription medicines only as told by your health care provider.  Keep all follow-up visits as told by your health care provider. This is important.  Get help right away if your bleeding increases, your symptoms are getting worse, or you have new symptoms. This information is not intended to replace advice given to you by your health care provider. Make sure you discuss any questions you have with your health care provider. Document Revised: 07/09/2018 Document Reviewed: 07/09/2018 Elsevier Patient Education  2020 Elsevier Inc.  

## 2020-01-02 NOTE — Progress Notes (Signed)
Called by RR RN after pt had a witnessed syncopal episode with ?PEA. Pt became unresponsive for less than a minute. CPR was initiated but patient regained consciousness within seconds. VS are currently stable. Pt is back to baseline. Will transfer to stepdown for closer monitoring.    Lovey Newcomer, NP [facility] 7p-7a [phone]

## 2020-01-02 NOTE — Progress Notes (Addendum)
Patient with discharge order and noted with bloody stools after using bathroom. RN updated both patient and wife of POC.. Pt's wife requested to speak with attending for more answers. Patient also requests not to be discharge until tomorrow "just to be safe". RN notified MD.  RN left message with GI office for Dr. Watt Climes to respond.  Dr. Watt Climes call back. He verbalizes that he will check in w/ pt tomorrow and update pt's wife.   Will continue to monitor.

## 2020-01-02 NOTE — Progress Notes (Addendum)
RN walked to patient room to restart a peripheral iv. Patient was sitting on the side of the bed and he stated he was comfortable after RN told him to lay down. During the insertion, patient stated he was feeling dizzy so he was assisted to lay down in the bed. After a couple of minutes, patients wasn't responding to questions asked. RN assessed and found patient to be unresponsive with no pulse. A "code"  was called and RN initiated about <30 CPR's. Patient gained consciousness with body rigidity and a clear emesis of about 100cc. Patient was found to have garbled speech while gaining consciousness as well. RRT was notified. Vitals checked after patient gaining consciousness BP 154/ 79, Pulse 86 and CBG 154. Patient complained about nausea and slightly dizzy. RRT then notified on call Provider, and orders were placed to transfer patient to a progressive floor with close monitoring.

## 2020-01-02 NOTE — Significant Event (Addendum)
Rapid Response Event Note  Overview:Called d/t sycopal episode with questionable loss of pulse. Pt was sitting on side of bed when RN walked in to restart IV. He then began to c/o dizziness so RN laid him down in the bed. He then went unresponsive with questionable pulse. CPR done by beside RN <30 seconds. Pt then became very rigid, woke up, and began vomiting small amount clear emesis. Time Called: 2228 Arrival Time: 2232 Event Type: Neurologic, Cardiac  Initial Focused Assessment: On my arrival, pt laying in bed with eyes opened, alert and oriented, following commands, and moving all extremities. Pt denies chest pain and SOB. HR-88, SBP-160s, RR-18, SpO2-100% on RA. Skin warm and dry. Pt c/o nausea.  PIV not working, pt is hesitant to get another one. IV team paged for assistance.  Interventions: CBG-154 CBC/BMP IVT to bedside to start IV Tele monitoring PCU tx for closer monitoring Plan of Care (if not transferred): Tx to 4E15  2318-prior to transport to 4E pt vomited large amount clear emesis. After vomiting, pt began to c/o chest pain/SOB. Pt transferred to 4E, VSS, EKG obtained(SR with 1st degree HB), and PCXR ordered. [place], NP updated.  Event Summary: Name of Physician Notified: Kennon Holter, NP at 2245    at    Outcome: Transferred (Comment)(PCU)  Event End Time: 2250  Dillard [place]

## 2020-01-02 NOTE — Discharge Summary (Signed)
Physician Discharge Summary  Roy David [id number] DOB: 14-Jan-1930 DOA: 12/31/2019  PCP: [name], L.Marlou Sa, MD  Admit date: 12/31/2019 Discharge date: 01/02/2020  Admitted From: Home Disposition: Home  Recommendations for Outpatient Follow-up:  1. Follow up with PCP in 1-2 weeks 2. Please obtain BMP/CBC in one week 3. Please follow up on the following pending results:  Home Health: None Equipment/Devices: None  Discharge Condition: Stable CODE STATUS: Full code Diet recommendation: Cardiac  Subjective: Seen and examined.  No complaints.  No further bleeding.  Brief/Interim Summary: Roy David a 86 y.o.malewith medical history significant ofprostate CA; PAF on Eliquis; HTN; AS; and HLD presented with lower GI bleeding/syncopal episode in triage.The patient reports that he had BRBPR on Saturday. He had intermittent bleeding through the week and then recurrence this AM with 4+ episodes at home and then a syncopal episode.  Patient was hemodynamically stable upon arrival to the emergency department.  His hemoglobin was 9.8, down from previously known of 14.6 however that was more than a year ago.  No recent hemoglobin in chart.  GI was consulted however patient did not have any single episode of rectal bleeding since admission.  His hemoglobin dropped to as low as 7.4 this morning but repeat H&H shows improvement up to 8.3 without any transfusion.  GI did not recommend any colonoscopy at this point in time.  Prior to admission, patient was on Eliquis for his PAF prescribed by his cardiologist.  This was held.  After having a long discussion with patient, he tells me that he was on Xarelto about a year ago and he had GI bleeding for which he was transitioned to low-dose aspirin and he did well up until few months ago when his cardiologist put him back on Eliquis.  Now that he has had second episode of GI bleeding so patient himself has decided against taking Eliquis and has decided to  go back on aspirin 81 mg which he states that he has at home and that he has follow-up appointment with his cardiologist on February 8 where he will talk to his cardiologist and essentially will try to convince him to agree with his decision.  He is being discharged in stable condition.  He is bleeding is presumed to be diverticular bleeding.  Discharge Diagnoses:  Principal Problem:   Acute lower GI bleeding Active Problems:   Essential hypertension   Hypercholesterolemia   Paroxysmal atrial fibrillation [facility])    Discharge Instructions  Discharge Instructions    Discharge patient   Complete by: As directed    Discharge disposition: 01-Home or Self Care   Discharge patient date: 01/02/2020     Allergies as of 01/02/2020      Reactions   Bee Venom Nausea Only, Other (See Comments)   Wasp and yellow jackets      Medication List    STOP taking these medications   Eliquis 5 MG Tabs tablet Generic drug: apixaban     TAKE these medications   albuterol 108 (90 Base) MCG/ACT inhaler Commonly known as: VENTOLIN HFA Inhale 1 puff into the lungs daily as needed for wheezing or shortness of breath.   Asmanex (60 Metered Doses) 220 MCG/INH inhaler Generic drug: mometasone Inhale 1 Inhaler into the lungs daily.   cholecalciferol 1000 units tablet Commonly known as: VITAMIN D Take 1,000 Units by mouth daily.   co-enzyme Q-10 30 MG capsule Take 200 mg by mouth daily.   doxycycline 100 MG capsule Commonly known as: MONODOX Take 100 mg  by mouth 2 (two) times daily as needed.   EPINEPHrine 0.3 mg/0.3 mL Soaj injection Commonly known as: EPI-PEN Inject 0.3 mg into the muscle once.   ezetimibe-simvastatin 10-20 MG tablet Commonly known as: VYTORIN Take 1 tablet by mouth daily.   ferrous sulfate 325 (65 FE) MG tablet Take 325 mg by mouth daily with breakfast.   gabapentin 300 MG capsule Commonly known as: NEURONTIN Take 300 mg by mouth 2 (two) times daily.    GLUCOSAMINE-CHONDROITIN PO Take 1 tablet by mouth 2 (two) times daily.   levothyroxine 100 MCG tablet Commonly known as: SYNTHROID Take 100 mcg by mouth daily.   lisinopril-hydrochlorothiazide 20-25 MG tablet Commonly known as: ZESTORETIC Take 1 tablet by mouth daily.   predniSONE 20 MG tablet Commonly known as: DELTASONE Take 20 mg by mouth 2 (two) times daily as needed.   triamcinolone cream 0.1 % Commonly known as: KENALOG Apply 1 application topically 2 (two) times daily as needed (itching).   vitamin B-12 1000 MCG tablet Commonly known as: CYANOCOBALAMIN Take 1,000 mcg by mouth daily.      Follow-up Information    [name], L.Marlou Sa, MD Follow up in 1 week(s).   Specialty: Family Medicine Contact information: [address] [place] [address] [phone]        Sanda Klein, MD .   Specialty: Cardiology Contact information: [address] [place] [address] [phone]          Allergies  Allergen Reactions  . Bee Venom Nausea Only and Other (See Comments)    Wasp and yellow jackets    Consultations: GI   Procedures/Studies: NM GI Blood Loss  Addendum Date: 12/31/2019   ADDENDUM REPORT: 12/31/2019 19:16 ADDENDUM: These results were called by telephone at the time of interpretation on 12/31/2019 at 7:16 pm to provider Hayden Pedro, who verbally acknowledged these results. Electronically Signed   By: Constance Holster M.D.   On: 12/31/2019 19:16   Result Date: 12/31/2019 CLINICAL DATA:  GI bleeding EXAM: NUCLEAR MEDICINE GASTROINTESTINAL BLEEDING SCAN TECHNIQUE: Sequential abdominal images were obtained following intravenous administration of Tc-8m labeled red blood cells. RADIOPHARMACEUTICALS:  Twenty-five mCi Tc-2m pertechnetate in-vitro labeled red cells. COMPARISON:  None. FINDINGS: There is accumulation of radiotracer in the left upper quadrant in the region of the splenic flexure, early on the study.  There is limited flow of radiotracer downstream from this point in the first hour. On the second hour of the study, there is increasing accumulation of radiotracer in the left upper quadrant at the level of the splenic flexure with some vague downstream peristalsis. IMPRESSION: Study is positive for active GI bleeding in the left upper quadrant, likely at the level of the splenic flexure of the colon. Electronically Signed: By: Constance Holster M.D. On: 12/31/2019 19:06   CT Angio Abd/Pel w/ and/or w/o  Result Date: 12/31/2019 CLINICAL DATA:  GI bleed EXAM: CTA ABDOMEN AND PELVIS WITHOUT AND WITH CONTRAST TECHNIQUE: Multidetector CT imaging of the abdomen and pelvis was performed using the standard protocol during bolus administration of intravenous contrast. Multiplanar reconstructed images and MIPs were obtained and reviewed to evaluate the vascular anatomy. CONTRAST:  10mL OMNIPAQUE IOHEXOL 350 MG/ML SOLN COMPARISON:  None. FINDINGS: VASCULAR Aorta: Atherosclerotic changes are noted throughout the abdominal aorta without evidence for stenosis or aneurysmal dilatation. Celiac: There is mild-to-moderate narrowing of the proximal celiac axis with some poststenotic dilatation. The splenic artery is unremarkable. The hepatic arterial anatomy appears to be normal. SMA:  Patent without evidence of aneurysm, dissection, vasculitis or significant stenosis. Renals: Both renal arteries are patent without evidence of aneurysm, dissection, vasculitis, fibromuscular dysplasia or significant stenosis. IMA: Patent without evidence of aneurysm, dissection, vasculitis or significant stenosis. Inflow: Patent without evidence of aneurysm, dissection, vasculitis or significant stenosis. Proximal Outflow: Bilateral common femoral and visualized portions of the superficial and profunda femoral arteries are patent without evidence of aneurysm, dissection, vasculitis or significant stenosis. Veins: No obvious venous abnormality  within the limitations of this arterial phase study. Review of the MIP images confirms the above findings. NON-VASCULAR Lower chest: There is a 1.1 cm airspace opacity at the left lung base (axial series 6, image 13).The heart size is normal. Hepatobiliary: There is a small hypoattenuating nodule within the right hepatic lobe that is favored to represent a benign cyst. Normal gallbladder.There is no biliary ductal dilation. Pancreas: There is a 2.1 x 1.3 cm cystic mass in the pancreatic neck. Spleen: No splenic laceration or hematoma. Adrenals/Urinary Tract: --Adrenal glands: No adrenal hemorrhage. --Right kidney/ureter: No hydronephrosis or perinephric hematoma. --Left kidney/ureter: No hydronephrosis or perinephric hematoma. --Urinary bladder: Unremarkable. Stomach/Bowel: --Stomach/Duodenum: No hiatal hernia or other gastric abnormality. Normal duodenal course and caliber. --Small bowel: No dilatation or inflammation. --Colon: There is severe diverticulosis involving the sigmoid colon, descending colon, and distal transverse colon. There is hyperdense intraluminal contents in the splenic flexure of the colon. There is no evidence for active arterial extravasation on this study. --Appendix: Normal. Lymphatic: --No retroperitoneal lymphadenopathy. --No mesenteric lymphadenopathy. --No pelvic or inguinal lymphadenopathy. Reproductive: Brachytherapy beads are noted in the prostate gland. Other: No ascites or free air. The abdominal wall is normal. Musculoskeletal. Moderate to severe degenerative changes are noted of the lower lumbar spine. There is no acute displaced fracture. IMPRESSION: 1. No evidence for active arterial GI bleeding on this study. 2. Hyperdense intraluminal fluid is noted in the region of the splenic flexure. This could represent blood products and corresponds well to the prior GI bleeding study. However, again there is no evidence for active arterial extravasation on this study. 3. Severe  diverticulosis as detailed above. 4. There is a 2.1 cm cystic mass in the neck of the pancreas. Recommend follow up pre and post contrast MRI/MRCP or pancreatic protocol CT in 2 years. This recommendation follows ACR consensus guidelines: Management of Incidental Pancreatic Cysts: A White Paper of the ACR Incidental Findings Committee. J Am Coll Radiol [id number]. 5. There is a 1.1 cm airspace opacity in the left lower lobe. A follow-up CT of the chest in 3 months is recommended for further evaluation of this finding. 6.  Aortic Atherosclerosis (ICD10-I70.0). Electronically Signed   By: Constance Holster M.D.   On: 12/31/2019 21:16      Discharge Exam: Vitals:   01/02/20 0509 01/02/20 0958  BP: 126/76 (!) 153/78  Pulse: 82 80  Resp: 18 18  Temp: 98.1 F (36.7 C) 97.9 F (36.6 C)  SpO2: 97% 98%   Vitals:   01/01/20 1335 01/01/20 2313 01/02/20 0509 01/02/20 0958  BP: 124/62 119/68 126/76 (!) 153/78  Pulse: 80 85 82 80  Resp: 18 18 18 18   Temp: (!) 97.4 F (36.3 C) 98.4 F (36.9 C) 98.1 F (36.7 C) 97.9 F (36.6 C)  TempSrc: Oral Oral Oral Oral  SpO2: 100% 96% 97% 98%    General: Pt is alert, awake, not in acute distress Cardiovascular: RRR, S1/S2 +, no rubs, no gallops Respiratory: CTA bilaterally, no wheezing, no rhonchi Abdominal: Soft, NT,  ND, bowel sounds + Extremities: no edema, no cyanosis    The results of significant diagnostics from this hospitalization (including imaging, microbiology, ancillary and laboratory) are listed below for reference.     Microbiology: Recent Results (from the past 240 hour(s))  Respiratory Panel by RT PCR (Flu A&B, Covid) - Nasopharyngeal Swab     Status: None   Collection Time: 12/31/19 11:19 AM   Specimen: Nasopharyngeal Swab  Result Value Ref Range Status   SARS Coronavirus 2 by RT PCR NEGATIVE NEGATIVE Final    Comment: (NOTE) SARS-CoV-2 target nucleic acids are NOT DETECTED. The SARS-CoV-2 RNA is generally detectable in  upper respiratoy specimens during the acute phase of infection. The lowest concentration of SARS-CoV-2 viral copies this assay can detect is 131 copies/mL. A negative result does not preclude SARS-Cov-2 infection and should not be used as the sole basis for treatment or other patient management decisions. A negative result may occur with  improper specimen collection/handling, submission of specimen other than nasopharyngeal swab, presence of viral mutation(s) within the areas targeted by this assay, and inadequate number of viral copies (<131 copies/mL). A negative result must be combined with clinical observations, patient history, and epidemiological information. The expected result is Negative. Fact Sheet for Patients:  PinkCheek.be Fact Sheet for Healthcare Providers:  GravelBags.it This test is not yet ap proved or cleared by the [place] FDA and  has been authorized for detection and/or diagnosis of SARS-CoV-2 by FDA under an Emergency Use Authorization (EUA). This EUA will remain  in effect (meaning this test can be used) for the duration of the COVID-19 declaration under Section 564(b)(1) of the Act, 21 U.S.C. section 360bbb-3(b)(1), unless the authorization is terminated or revoked sooner.    Influenza A by PCR NEGATIVE NEGATIVE Final   Influenza B by PCR NEGATIVE NEGATIVE Final    Comment: (NOTE) The Xpert Xpress SARS-CoV-2/FLU/RSV assay is intended as an aid in  the diagnosis of influenza from Nasopharyngeal swab specimens and  should not be used as a sole basis for treatment. Nasal washings and  aspirates are unacceptable for Xpert Xpress SARS-CoV-2/FLU/RSV  testing. Fact Sheet for Patients: PinkCheek.be Fact Sheet for Healthcare Providers: GravelBags.it This test is not yet approved or cleared by the [place] FDA and  has been authorized for  detection and/or diagnosis of SARS-CoV-2 by  FDA under an Emergency Use Authorization (EUA). This EUA will remain  in effect (meaning this test can be used) for the duration of the  Covid-19 declaration under Section 564(b)(1) of the Act, 21  U.S.C. section 360bbb-3(b)(1), unless the authorization is  terminated or revoked. Performed at [facility], [place] [address], [place], [place] [address]      Labs: BNP (last 3 results) No results for input(s): BNP in the last 8760 hours. Basic Metabolic Panel: Recent Labs  Lab 12/31/19 1058 12/31/19 1115 01/01/20 0710 01/02/20 0440  NA 134* 137 135 137  K 3.7 3.7 4.0 3.8  CL 102 102 102 104  CO2 22  --  23 25  GLUCOSE 156* 151* 130* 143*  BUN 33* 30* 29* 27*  CREATININE 1.40* 1.40* 1.25* 1.34*  CALCIUM 8.8*  --  8.4* 8.7*   Liver Function Tests: Recent Labs  Lab 12/31/19 1058  AST 25  ALT 16  ALKPHOS 48  BILITOT 0.7  PROT 5.6*  ALBUMIN 3.2*   No results for input(s): LIPASE, AMYLASE in the last 168 hours. No results for input(s): AMMONIA in the last  168 hours. CBC: Recent Labs  Lab 12/31/19 1058 12/31/19 1115 12/31/19 2022 01/01/20 0710 01/01/20 1749 01/02/20 0440 01/02/20 1050  WBC 12.8*  --  11.7* 9.3  --  9.0  --   NEUTROABS 10.6*  --   --   --   --   --   --   HGB 9.8*   < > 8.8* 8.0* 7.9* 7.4* 8.3*  HCT 29.8*   < > 26.0* 24.0* 23.2* 22.0* 24.8*  MCV 100.3*  --  98.1 98.8  --  98.2  --   PLT 257  --  233 232  --  229  --    < > = values in this interval not displayed.   Cardiac Enzymes: No results for input(s): CKTOTAL, CKMB, CKMBINDEX, TROPONINI in the last 168 hours. BNP: Invalid input(s): POCBNP CBG: No results for input(s): GLUCAP in the last 168 hours. D-Dimer No results for input(s): DDIMER in the last 72 hours. Hgb A1c No results for input(s): HGBA1C in the last 72 hours. Lipid Profile No results for input(s): CHOL, HDL, LDLCALC, TRIG, CHOLHDL, LDLDIRECT in the last 72 hours. Thyroid  function studies No results for input(s): TSH, T4TOTAL, T3FREE, THYROIDAB in the last 72 hours.  Invalid input(s): FREET3 Anemia work up No results for input(s): VITAMINB12, FOLATE, FERRITIN, TIBC, IRON, RETICCTPCT in the last 72 hours. Urinalysis No results found for: COLORURINE, APPEARANCEUR, [place], [place], [place], [place], [place], [place], PROTEINUR, UROBILINOGEN, NITRITE, LEUKOCYTESUR Sepsis Labs Invalid input(s): PROCALCITONIN,  WBC,  LACTICIDVEN Microbiology Recent Results (from the past 240 hour(s))  Respiratory Panel by RT PCR (Flu A&B, Covid) - Nasopharyngeal Swab     Status: None   Collection Time: 12/31/19 11:19 AM   Specimen: Nasopharyngeal Swab  Result Value Ref Range Status   SARS Coronavirus 2 by RT PCR NEGATIVE NEGATIVE Final    Comment: (NOTE) SARS-CoV-2 target nucleic acids are NOT DETECTED. The SARS-CoV-2 RNA is generally detectable in upper respiratoy specimens during the acute phase of infection. The lowest concentration of SARS-CoV-2 viral copies this assay can detect is 131 copies/mL. A negative result does not preclude SARS-Cov-2 infection and should not be used as the sole basis for treatment or other patient management decisions. A negative result may occur with  improper specimen collection/handling, submission of specimen other than nasopharyngeal swab, presence of viral mutation(s) within the areas targeted by this assay, and inadequate number of viral copies (<131 copies/mL). A negative result must be combined with clinical observations, patient history, and epidemiological information. The expected result is Negative. Fact Sheet for Patients:  PinkCheek.be Fact Sheet for Healthcare Providers:  GravelBags.it This test is not yet ap proved or cleared by the [place] FDA and  has been authorized for detection and/or diagnosis of SARS-CoV-2 by FDA under an Emergency Use  Authorization (EUA). This EUA will remain  in effect (meaning this test can be used) for the duration of the COVID-19 declaration under Section 564(b)(1) of the Act, 21 U.S.C. section 360bbb-3(b)(1), unless the authorization is terminated or revoked sooner.    Influenza A by PCR NEGATIVE NEGATIVE Final   Influenza B by PCR NEGATIVE NEGATIVE Final    Comment: (NOTE) The Xpert Xpress SARS-CoV-2/FLU/RSV assay is intended as an aid in  the diagnosis of influenza from Nasopharyngeal swab specimens and  should not be used as a sole basis for treatment. Nasal washings and  aspirates are unacceptable for Xpert Xpress SARS-CoV-2/FLU/RSV  testing. Fact Sheet for Patients: PinkCheek.be Fact Sheet for Healthcare Providers: GravelBags.it  This test is not yet approved or cleared by the [place] and  has been authorized for detection and/or diagnosis of SARS-CoV-2 by  FDA under an Emergency Use Authorization (EUA). This EUA will remain  in effect (meaning this test can be used) for the duration of the  Covid-19 declaration under Section 564(b)(1) of the Act, 21  U.S.C. section 360bbb-3(b)(1), unless the authorization is  terminated or revoked. Performed at [facility], [place] [address], [place], [place] [address]      Time coordinating discharge: Over 30 minutes  SIGNED:   Darliss Cheney, MD  [facility] 01/02/2020, 12:06 PM  If 7PM-7AM, please contact night-coverage [email or web address] Password [email or web address]

## 2020-01-02 NOTE — Progress Notes (Signed)
Received patient to room 4E15 from [facility], patient altert and oriented, complaining of chest pain. Morphine ordered, EKG done and placed in chart. Tele monitor applied and CCMD notified. CHG bath given. Oriented to room and call bell within reach.

## 2020-01-02 NOTE — Progress Notes (Signed)
Roy David 1:30 PM  Subjective: Patient seen and examined in his hospital computer chart reviewed and his case discussed with my partner Dr. Alessandra Bevels and has had no signs of obvious bleeding and we discussed his previous colonoscopy with my partner Dr. Cristina Gong and we discussed his various blood thinners he has been on and he has no new complaints  Objective: Vital signs stable afebrile no acute distress abdomen is soft nontender hemoglobin stable  Assessment: Probable splenic flexure diverticular bleeding  Plan: He will follow-up in a week or 2 with Dr. Cristina Gong and decide if outpatient colonoscopy is needed and will call us sooner as needed and have recommended holding off on blood thinners or aspirin for at least a week and please call me if I can be of any further assistance with this hospital stay  [facility]  office [phone] After 5PM or if no answer call [phone]

## 2020-01-03 LAB — BASIC METABOLIC PANEL
Anion gap: 12 (ref 5–15)
Anion gap: 7 (ref 5–15)
BUN: 27 mg/dL — ABNORMAL HIGH (ref 8–23)
BUN: 28 mg/dL — ABNORMAL HIGH (ref 8–23)
CO2: 20 mmol/L — ABNORMAL LOW (ref 22–32)
CO2: 25 mmol/L (ref 22–32)
Calcium: 8.6 mg/dL — ABNORMAL LOW (ref 8.9–10.3)
Calcium: 8.7 mg/dL — ABNORMAL LOW (ref 8.9–10.3)
Chloride: 102 mmol/L (ref 98–111)
Chloride: 102 mmol/L (ref 98–111)
Creatinine, Ser: 1.13 mg/dL (ref 0.61–1.24)
Creatinine, Ser: 1.34 mg/dL — ABNORMAL HIGH (ref 0.61–1.24)
GFR calc Af Amer: 55 mL/min — ABNORMAL LOW (ref >60)
GFR calc Af Amer: >60 mL/min (ref >60)
GFR calc non Af Amer: 47 mL/min — ABNORMAL LOW (ref >60)
GFR calc non Af Amer: 58 mL/min — ABNORMAL LOW (ref >60)
Glucose, Bld: 145 mg/dL — ABNORMAL HIGH (ref 70–99)
Glucose, Bld: 168 mg/dL — ABNORMAL HIGH (ref 70–99)
Potassium: 3.2 mmol/L — ABNORMAL LOW (ref 3.5–5.1)
Potassium: 3.6 mmol/L (ref 3.5–5.1)
Sodium: 134 mmol/L — ABNORMAL LOW (ref 135–145)
Sodium: 134 mmol/L — ABNORMAL LOW (ref 135–145)

## 2020-01-03 LAB — CBC
HCT: 19.8 % — ABNORMAL LOW (ref 39.0–52.0)
HCT: 27.3 % — ABNORMAL LOW (ref 39.0–52.0)
Hemoglobin: 6.9 g/dL — CL (ref 13.0–17.0)
Hemoglobin: 9.4 g/dL — ABNORMAL LOW (ref 13.0–17.0)
MCH: 34 pg (ref 26.0–34.0)
MCH: 33.3 pg (ref 26.0–34.0)
MCHC: 34.8 g/dL (ref 30.0–36.0)
MCHC: 34.4 g/dL (ref 30.0–36.0)
MCV: 97.5 fL (ref 80.0–100.0)
MCV: 96.8 fL (ref 80.0–100.0)
Platelets: 237 10*3/uL (ref 150–400)
Platelets: 250 10*3/uL (ref 150–400)
RBC: 2.03 MIL/uL — ABNORMAL LOW (ref 4.22–5.81)
RBC: 2.82 MIL/uL — ABNORMAL LOW (ref 4.22–5.81)
RDW: 12.9 % (ref 11.5–15.5)
RDW: 14.6 % (ref 11.5–15.5)
WBC: 15.9 10*3/uL — ABNORMAL HIGH (ref 4.0–10.5)
WBC: 13.3 10*3/uL — ABNORMAL HIGH (ref 4.0–10.5)
nRBC: 0 % (ref 0.0–0.2)
nRBC: 0 % (ref 0.0–0.2)

## 2020-01-03 LAB — HEMOGLOBIN AND HEMATOCRIT, BLOOD
HCT: 27.4 % — ABNORMAL LOW (ref 39.0–52.0)
Hemoglobin: 9.6 g/dL — ABNORMAL LOW (ref 13.0–17.0)

## 2020-01-03 LAB — TROPONIN I (HIGH SENSITIVITY)
Troponin I (High Sensitivity): 10 ng/L (ref <18)
Troponin I (High Sensitivity): 11 ng/L (ref <18)

## 2020-01-03 LAB — PREPARE RBC (CROSSMATCH)

## 2020-01-03 MED ORDER — SODIUM CHLORIDE 0.9% IV SOLUTION: Freq: Once | INTRAVENOUS | Status: AC

## 2020-01-03 MED ORDER — LISINOPRIL 10 MG PO TABS
20.0000 mg | ORAL_TABLET | Freq: Every day | ORAL | Status: DC
  Administered 2020-01-03 – 2020-01-05 (×3): 20 mg via ORAL
  Filled 2020-01-03 (×3): qty 2

## 2020-01-03 NOTE — Progress Notes (Signed)
CRITICAL VALUE STICKER  CRITICAL VALUE: Hgb 6.9  RECEIVER (on-site recipient of call): [place] NOTIFIED: 37  MESSENGER (representative from lab): N/A  MD NOTIFIED: Lovey Newcomer   TIME OF NOTIFICATION: [id number]  RESPONSE: 2 units of RBCs

## 2020-01-03 NOTE — Plan of Care (Signed)
  Problem: Health Behavior/Discharge Planning: Goal: Ability to manage health-related needs will improve Outcome: Progressing   Problem: Clinical Measurements: Goal: Respiratory complications will improve Outcome: Progressing Goal: Cardiovascular complication will be avoided Outcome: Progressing

## 2020-01-03 NOTE — Progress Notes (Signed)
Mrs. Kelner, patient's wife was notified on [phone] about patient's been transferred to 4E15. Was appreciative.

## 2020-01-03 NOTE — Progress Notes (Signed)
,  PROGRESS Sricharan Treadaway  [id number] DOB: 12-10-1930 DOA: 12/31/2019 PCP: [name], L.Marlou Sa, MD   Brief Narrative:  Roy David is a 86 y.o. male with medical history significant of prostate CA; PAF on Xarelto; HTN; AS; and HLD presented with lower GI bleeding/bright red blood per rectum since Saturday and also had single syncopal episode in triage.   He also vomited dark red stuff in triage.  He was admitted under hospital service or bright red blood per rectum and GI was consulted.  Patient then did not have any bright red blood per rectum for 2 days so he was discharged on 01/02/2020 however right before discharge, he had another large bloody bowel movement so discharge was canceled.  Later that night, he also had another episode of syncope while the nurse was in the room and it was thought that he was not having pulse so CPR was started which lasted for less than 30 seconds and patient woke up.  RRT was called.  He was then transferred to different floor/stepdown.  Patient has not had any further GI bleeding since last evening.  His hemoglobin dropped to 6.9 this morning and 2 units PRBC transfusion has been ordered.   Assessment & Plan:   Principal Problem:   Acute lower GI bleeding Active Problems:   Essential hypertension   Hypercholesterolemia   Paroxysmal atrial fibrillation (HCC)   Acute lower GI bleeding/acute blood loss anemia: Patient presented with hemoglobin of 9.8 which dropped to 8.0.  Patient did not have any rectal bleeding for 48 hours when seen on the morning of 01/02/2020 so he was discharged home after he was cleared by GI however right before discharge in the afternoon, he had a large rectal bleeding so discharge was canceled.  His hemoglobin then dropped to 6.9 this morning and 2 units of PRBC transfusion were ordered by night team.  He has not had any rectal bleeding since last evening.  GI aware and they are going to see him and we defer further management to  them.  Monitor H&H every 12 hours.  Continue to hold Eliquis.  Syncope/unresponsiveness: He had episode of syncope again yesterday.  He tells me that his IV was not working and the nurse was trying to poke him when this happened.  Based on this, this is likely vasovagal syncope.  I doubt that he had lost his pulse.  CPR lasted for less than 30 seconds.  He is doing well with no problems right now other than sternal chest pain.  Monitor.  Sternal chest pain: Secondary to CPR.  Troponin x2 -.  No shortness of breath.  Essential hypertension: Blood pressure slightly elevated.  Resume lisinopril but continue to hold hydrochlorothiazide.  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 Sallyanne Kuster on 01/18/2020 and he will discuss further with him.  Hypothyroidism -Continue Synthroid  DVT prophylaxis: SCD Code Status: FULL CODE Family Communication: None present at bedside.  I called and updated his wife [name] over the phone while I was seeing him and answered several questions.  She has further questions from GI. Disposition Plan: Will be discharged home once cleared by  GI and clinically improved.  Potentially next 1 to 2 days.  Estimated body mass index is 23.42 kg/m as calculated from the following:   Height as of this encounter: 5\' 8"  (1.727 m).   Weight as of this encounter: 69.9 kg.      Nutritional status:               Consultants:   GI  Procedures:   None  Antimicrobials:   None   Subjective: Seen and examined.  No complaints right now.  He had rectal bleeding yesterday right before discharge that was  in the afternoon.  Events from last night noted where he had another episode of syncope and his pulse was thought to be missing so CPR was initiated with lasted for less than 30 seconds and patient woke up.  RRT was called.  He was transferred to stepdown.  He complains of anterior rib cage pain due to CPR.  No further GI bleeding.  Objective: Vitals:   01/03/20 0410 01/03/20 0622 01/03/20 0745 01/03/20 0818  BP: 132/90 (!) 155/87 (!) 147/82 (!) 146/83  Pulse: 85 79 82 82  Resp: 20 19 18 16   Temp: 98.2 F (36.8 C) 98.1 F (36.7 C) 97.6 F (36.4 C) 97.6 F (36.4 C)  TempSrc: Oral Oral Oral Oral  SpO2: 100% 100% 96% 97%  Weight:      Height:        Intake/Output Summary (Last 24 hours) at 01/03/2020 1024 Last data filed at 01/03/2020 0854 Gross per 24 hour  Intake 2606.89 ml  Output 700 ml  Net 1906.89 ml   Filed Weights   01/03/20 0405  Weight: 69.9 kg    Examination:  General exam: Appears calm and comfortable  Respiratory system: Clear to auscultation. Respiratory effort normal.  Point tenderness at the sternum. Cardiovascular system: S1 & S2 heard, RRR. No JVD, murmurs, rubs, gallops or clicks. No pedal edema. Gastrointestinal system: Abdomen is nondistended, soft and nontender. No organomegaly or masses felt. Normal bowel sounds heard. Central nervous system: Alert and oriented. No focal neurological deficits. Extremities: Symmetric 5 x 5 power. Skin: No rashes, lesions or ulcers.  Psychiatry: Judgement and insight appear normal. Mood & affect appropriate.   Data Reviewed: I have personally reviewed following labs and imaging studies  CBC: Recent Labs  Lab 12/31/19 1058 12/31/19 1115 12/31/19 2022 12/31/19 2022 01/01/20 0710 01/01/20 1749 01/02/20 0440 01/02/20 1050 01/02/20 1658 01/02/20 2330 01/03/20 0215  WBC 12.8*   < > 11.7*  --  9.3  --  9.0  --   --  13.1* 15.9*  NEUTROABS 10.6*  --   --   --   --   --   --   --   --   --   --   HGB 9.8*   < > 8.8*    < > 8.0*   < > 7.4* 8.3* 8.1* 7.8* 6.9*  HCT 29.8*   < > 26.0*   < > 24.0*   < > 22.0* 24.8* 24.3* 22.9* 19.8*  MCV 100.3*   < > 98.1  --  98.8  --  98.2  --   --  97.4 97.5  PLT 257   < > 233  --  232  --  229  --   --  271 237   < > = values in this interval not displayed.   Basic Metabolic Panel: Recent Labs  Lab 12/31/19 1058 12/31/19 1058 12/31/19 1115 01/01/20 0710  01/02/20 0440 01/02/20 2330 01/03/20 0215  NA 134*   < > 137 135 137 134* 134*  K 3.7   < > 3.7 4.0 3.8 3.2* 3.6  CL 102   < > 102 102 104 102 102  CO2 22  --   --  23 25 20* 25  GLUCOSE 156*   < > 151* 130* 143* 168* 145*  BUN 33*   < > 30* 29* 27* 28* 27*  CREATININE 1.40*   < > 1.40* 1.25* 1.34* 1.34* 1.13  CALCIUM 8.8*  --   --  8.4* 8.7* 8.7* 8.6*   < > = values in this interval not displayed.   GFR: Estimated Creatinine Clearance: 44.6 mL/min (by C-G formula based on SCr of 1.13 mg/dL). Liver Function Tests: Recent Labs  Lab 12/31/19 1058  AST 25  ALT 16  ALKPHOS 48  BILITOT 0.7  PROT 5.6*  ALBUMIN 3.2*   No results for input(s): LIPASE, AMYLASE in the last 168 hours. No results for input(s): AMMONIA in the last 168 hours. Coagulation Profile: Recent Labs  Lab 12/31/19 1058  INR 1.1   Cardiac Enzymes: No results for input(s): CKTOTAL, CKMB, CKMBINDEX, TROPONINI in the last 168 hours. BNP (last 3 results) No results for input(s): PROBNP in the last 8760 hours. HbA1C: No results for input(s): HGBA1C in the last 72 hours. CBG: Recent Labs  Lab 01/02/20 2231  GLUCAP 154*   Lipid Profile: No results for input(s): CHOL, HDL, LDLCALC, TRIG, CHOLHDL, LDLDIRECT in the last 72 hours. Thyroid Function Tests: No results for input(s): TSH, T4TOTAL, FREET4, T3FREE, THYROIDAB in the last 72 hours. Anemia Panel: No results for input(s): VITAMINB12, FOLATE, FERRITIN, TIBC, IRON, RETICCTPCT in the last 72 hours. Sepsis Labs: No results for input(s): PROCALCITON, LATICACIDVEN in the last 168  hours.  Recent Results (from the past 240 hour(s))  Respiratory Panel by RT PCR (Flu A&B, Covid) - Nasopharyngeal Swab     Status: None   Collection Time: 12/31/19 11:19 AM   Specimen: Nasopharyngeal Swab  Result Value Ref Range Status   SARS Coronavirus 2 by RT PCR NEGATIVE NEGATIVE Final    Comment: (NOTE) SARS-CoV-2 target nucleic acids are NOT DETECTED. The SARS-CoV-2 RNA is generally detectable in upper respiratoy specimens during the acute phase of infection. The lowest concentration of SARS-CoV-2 viral copies this assay can detect is 131 copies/mL. A negative result does not preclude SARS-Cov-2 infection and should not be used as the sole basis for treatment or other patient management decisions. A negative result may occur with  improper specimen collection/handling, submission of specimen other than nasopharyngeal swab, presence of viral mutation(s) within the areas targeted by this assay, and inadequate number of viral copies (<131 copies/mL). A negative result must be combined with clinical observations, patient history, and epidemiological information. The expected result is Negative. Fact Sheet for Patients:  PinkCheek.be Fact Sheet for Healthcare Providers:  GravelBags.it This test is not yet ap proved or cleared by the [place] FDA and  has been authorized for detection and/or diagnosis of SARS-CoV-2 by FDA under an Emergency Use Authorization (EUA). This EUA will remain  in effect (meaning this test can be used) for the duration of the COVID-19 declaration under Section 564(b)(1) of the Act, 21 U.S.C. section 360bbb-3(b)(1), unless the authorization is terminated or revoked sooner.    Influenza A by PCR NEGATIVE NEGATIVE Final   Influenza B by PCR NEGATIVE NEGATIVE Final    Comment: (NOTE) The Xpert Xpress SARS-CoV-2/FLU/RSV  assay is intended as an aid in  the diagnosis of influenza from Nasopharyngeal  swab specimens and  should not be used as a sole basis for treatment. Nasal washings and  aspirates are unacceptable for Xpert Xpress SARS-CoV-2/FLU/RSV  testing. Fact Sheet for Patients: PinkCheek.be Fact Sheet for Healthcare Providers: GravelBags.it This test is not yet approved or cleared by the [place] FDA and  has been authorized for detection and/or diagnosis of SARS-CoV-2 by  FDA under an Emergency Use Authorization (EUA). This EUA will remain  in effect (meaning this test can be used) for the duration of the  Covid-19 declaration under Section 564(b)(1) of the Act, 21  U.S.C. section 360bbb-3(b)(1), unless the authorization is  terminated or revoked. Performed at [facility], [place] [address], [place], [place] [address]       Radiology Studies: DG CHEST PORT 1 VIEW  Result Date: 01/03/2020 CLINICAL DATA:  Shortness of breath. EXAM: PORTABLE CHEST 1 VIEW COMPARISON:  09/14/2010, lung bases from abdominal CTA 12/31/2019 FINDINGS: Implanted loop recorder in the left chest wall. Small left lung base opacity on prior CT are not visualized radiographically. Upper normal heart size with normal mediastinal contours. Aortic atherosclerosis. Focal airspace disease. No pulmonary edema, pleural effusion, or pneumothorax. Chronic changes of both shoulders. No acute osseous abnormalities are seen. IMPRESSION: No acute abnormality. Small left lung base opacity on prior CT are not visualized radiographically. Aortic Atherosclerosis (ICD10-I70.0). Electronically Signed   By: Keith Rake M.D.   On: 01/03/2020 00:01    Scheduled Meds: . ezetimibe-simvastatin  1 tablet Oral Daily  . ferrous sulfate  325 mg Oral Q breakfast  . fluticasone  1 spray Each Nare Daily  . gabapentin  300 mg Oral BID  . levothyroxine  100 mcg Oral Daily  . pantoprazole  40 mg Intravenous Q12H  . sodium chloride flush  3 mL Intravenous Q12H    Continuous Infusions: . lactated ringers Stopped (01/03/20 0351)     LOS: 1 day   Time spent: 33 minutes   Darliss Cheney, MD [facility]  01/03/2020, 10:24 AM   To contact the attending provider between 7A-7P or the covering provider during after hours 7P-7A, please log into the web site [email or web address].

## 2020-01-03 NOTE — Progress Notes (Signed)
Azir Sookram 12:51 PM  Subjective: Patient seen and examined and case extensively discussed with his wife in his office computer chart and specifically last colonoscopy in 2016 was reviewed and he has not had any further bleeding today and most of his bleeding yesterday was more black with only a little bright red and passing out and chest compressions was discussed and we had a long talk about his heart issue and I answered all of their questions  Objective: Vital signs stable afebrile no acute distress lungs are clear heart regular rate and rhythm abdomen is soft nontender hemoglobin slight drop BUN okay troponins negative chest x-ray okay no obvious broken rib reported  Assessment: Diverticular bleeding  Plan: Will watch today and tomorrow and if no additional cardiac events or problems proceed with colonoscopy on Tuesday and the patient and his wife agree with that plan and will put on clear liquids just in case and consult IR if signs of acute rebleeding sooner for consideration of going directly to angiogram without repeating previous work-up  [facility]  office [phone] After 5PM or if no answer call [phone]

## 2020-01-03 NOTE — Progress Notes (Signed)
2 units PRBC transfusion complete. VSS. H & H ordered for 1300. Pt denies needs at this time.  Arletta Bale, RN

## 2020-01-04 LAB — TYPE AND SCREEN
ABO/RH(D): A POS
Antibody Screen: NEGATIVE
Unit division: 0
Unit division: 0

## 2020-01-04 LAB — BASIC METABOLIC PANEL
Anion gap: 9 (ref 5–15)
BUN: 13 mg/dL (ref 8–23)
CO2: 28 mmol/L (ref 22–32)
Calcium: 8.5 mg/dL — ABNORMAL LOW (ref 8.9–10.3)
Chloride: 98 mmol/L (ref 98–111)
Creatinine, Ser: 1.3 mg/dL — ABNORMAL HIGH (ref 0.61–1.24)
GFR calc Af Amer: 57 mL/min — ABNORMAL LOW (ref >60)
GFR calc non Af Amer: 49 mL/min — ABNORMAL LOW (ref >60)
Glucose, Bld: 99 mg/dL (ref 70–99)
Potassium: 3.3 mmol/L — ABNORMAL LOW (ref 3.5–5.1)
Sodium: 135 mmol/L (ref 135–145)

## 2020-01-04 LAB — CBC
HCT: 27.7 % — ABNORMAL LOW (ref 39.0–52.0)
HCT: 28.4 % — ABNORMAL LOW (ref 39.0–52.0)
Hemoglobin: 9.4 g/dL — ABNORMAL LOW (ref 13.0–17.0)
Hemoglobin: 9.5 g/dL — ABNORMAL LOW (ref 13.0–17.0)
MCH: 33.2 pg (ref 26.0–34.0)
MCH: 32.8 pg (ref 26.0–34.0)
MCHC: 33.9 g/dL (ref 30.0–36.0)
MCHC: 33.5 g/dL (ref 30.0–36.0)
MCV: 97.9 fL (ref 80.0–100.0)
MCV: 97.9 fL (ref 80.0–100.0)
Platelets: 258 10*3/uL (ref 150–400)
Platelets: 267 10*3/uL (ref 150–400)
RBC: 2.83 MIL/uL — ABNORMAL LOW (ref 4.22–5.81)
RBC: 2.9 MIL/uL — ABNORMAL LOW (ref 4.22–5.81)
RDW: 14.7 % (ref 11.5–15.5)
RDW: 14.5 % (ref 11.5–15.5)
WBC: 10.6 10*3/uL — ABNORMAL HIGH (ref 4.0–10.5)
WBC: 11.3 10*3/uL — ABNORMAL HIGH (ref 4.0–10.5)
nRBC: 0 % (ref 0.0–0.2)
nRBC: 0 % (ref 0.0–0.2)

## 2020-01-04 LAB — BPAM RBC
Blood Product Expiration Date: 202101312359
Blood Product Expiration Date: 202102142359
ISSUE DATE / TIME: 202101240333
ISSUE DATE / TIME: 202101240753
Unit Type and Rh: 6200
Unit Type and Rh: 6200

## 2020-01-04 MED ORDER — SODIUM CHLORIDE 0.9 % IV SOLN: INTRAVENOUS | Status: DC

## 2020-01-04 MED ORDER — LIDOCAINE 5 % EX PTCH
1.0000 | MEDICATED_PATCH | CUTANEOUS | Status: DC
  Administered 2020-01-04: 17:00:00 1 via TRANSDERMAL
  Filled 2020-01-04: qty 1

## 2020-01-04 MED ORDER — EZETIMIBE-SIMVASTATIN 10-20 MG PO TABS: 1.0000 | ORAL_TABLET | Freq: Every day | ORAL | Status: DC

## 2020-01-04 MED ORDER — EZETIMIBE 10 MG PO TABS
10.0000 mg | ORAL_TABLET | Freq: Every day | ORAL | Status: DC
  Administered 2020-01-04 – 2020-01-05 (×2): 10 mg via ORAL
  Filled 2020-01-04 (×2): qty 1

## 2020-01-04 MED ORDER — PEG 3350-KCL-NA BICARB-NACL 420 G PO SOLR
4000.0000 mL | Freq: Once | ORAL | Status: AC
  Administered 2020-01-04: 17:00:00 4000 mL via ORAL
  Filled 2020-01-04 (×2): qty 4000

## 2020-01-04 MED ORDER — SIMVASTATIN 20 MG PO TABS
20.0000 mg | ORAL_TABLET | Freq: Every day | ORAL | Status: DC
  Administered 2020-01-04 – 2020-01-05 (×2): 20 mg via ORAL
  Filled 2020-01-04 (×2): qty 1

## 2020-01-04 MED ORDER — POTASSIUM CHLORIDE CRYS ER 20 MEQ PO TBCR
40.0000 meq | EXTENDED_RELEASE_TABLET | Freq: Once | ORAL | Status: AC
  Administered 2020-01-04: 10:00:00 40 meq via ORAL
  Filled 2020-01-04: qty 2

## 2020-01-04 NOTE — Progress Notes (Signed)
Roy David 1:23 PM  Subjective: Patient seen and examined and discussed with his wife as well and other than some back pain since his brief CPR he has no other complaints and no signs of bleeding  Objective: Vital signs stable afebrile no acute distress abdomen is soft nontender lungs are clear heart regular rate and rhythm Hemoglobin stable BUN okay Assessment: Diverticular bleeding  Plan: We will plan colonoscopy to be sure tomorrow and if no signs of bleeding and well-tolerated and no significant findings probably could go home tomorrow afterward and again we discussed the prep and a lidocaine patch which he requests  [facility]  office [phone] After 5PM or if no answer call [phone]

## 2020-01-04 NOTE — Progress Notes (Signed)
Colon prep still not on unit for patient at this time. Called pharmacy who states it should be out for deliver.

## 2020-01-04 NOTE — Plan of Care (Signed)
Continue to monitor

## 2020-01-04 NOTE — Progress Notes (Signed)
PROGRESS NOTE    Roy David  [id number] DOB: [birth year] DOA: 12/31/2019 PCP: [name], L.Marlou Sa, MD   Brief Narrative:  Roy David a 86 y.o.malewith medical history significant ofprostate CA; PAF on Xarelto; HTN; AS; and HLD presented with lower GI bleeding/bright red blood per rectum since Saturday and also had single syncopal episode in triage.  He also vomited dark red stuff in triage.  He was admitted under hospital service or bright red blood per rectum and GI was consulted.  Patient then did not have any bright red blood per rectum for 2 days so he was discharged on 01/02/2020 however right before discharge, he had another large bloody bowel movement so discharge was canceled.  Later that night, he also had another episode of syncope while the nurse was in the room and it was thought that he was not having pulse so CPR was started which lasted for less than 30 seconds and patient woke up.  RRT was called.  He was then transferred to different floor/stepdown.  Patient has not had any further GI bleeding since last evening.  His hemoglobin dropped to 6.9 this morning and 2 units PRBC transfusion has been ordered.  1/25:No further bleeding or BM noted. Stable hemoglobin/hematrocrit levels noted after transfusion. Plans for colonoscopy in am.  Assessment & Plan:   Principal Problem:   Acute lower GI bleeding Active Problems:   Essential hypertension   Hypercholesterolemia   Paroxysmal atrial fibrillation (HCC)   Acute lower GI bleeding/acute blood loss anemia: Patient presented with hemoglobin of 9.8 which dropped to 8.0.  Patient did not have any rectal bleeding for 48 hours when seen on the morning of 01/02/2020 so he was discharged home after he was cleared by GI however right before discharge in the afternoon, he had a large rectal bleeding so discharge was canceled.  His hemoglobin then dropped to 6.9 this morning and 2 units of PRBC transfusion were ordered by night team.  He  has not had any rectal bleeding since last evening.  GI aware and they are going to see him and we defer further management to them.  Monitor H&H every 12 hours.  Continue to hold Eliquis. -Plans for colonoscopy in am; H/H remains stable  Syncope/unresponsiveness: He had episode of syncope again yesterday.  He tells me that his IV was not working and the nurse was trying to poke him when this happened.  Based on this, this is likely vasovagal syncope.  I doubt that he had lost his pulse.  CPR lasted for less than 30 seconds.  He is doing well with no problems right now other than sternal chest pain.  Monitor. -No further episodes  Sternal chest pain: Secondary to CPR.  Troponin x2 -.  No shortness of breath.  Essential hypertension:controlled on lisinopril.  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 Sallyanne Kuster on 01/18/2020 and he will discuss further with him.  Hypothyroidism -Continue Synthroid   DVT prophylaxis:SCDs Code Status: Full code Family Communication: None at bedside Disposition Plan: Plan for colonoscopy per GI in am; monitor H/H   Consultants:   GI  Procedures:   None  Antimicrobials:  None   Subjective: Patient seen and evaluated today with no new acute complaints or concerns. No acute concerns or events noted overnight. No BM or rectal bleeding noted.  Objective: Vitals:   01/04/20 0837 01/04/20 0900 01/04/20 1000 01/04/20 1100  BP: (!) 144/86     Pulse: 82 87 96 89  Resp: (!) 24 (!) 33 (!) 22 18  Temp: 98.1 F (36.7 C)     TempSrc: Oral     SpO2: 98% 100% (!) 89% 97%  Weight:        Height:        Intake/Output Summary (Last 24 hours) at 01/04/2020 1212 Last data filed at 01/04/2020 0837 Gross per 24 hour  Intake 1310.18 ml  Output 3050 ml  Net -1739.82 ml   Filed Weights   01/03/20 0405  Weight: 69.9 kg    Examination:  General exam: Appears calm and comfortable  Respiratory system: Clear to auscultation. Respiratory effort normal. On 2L [place]. Cardiovascular system: S1 & S2 heard, RRR. No JVD, murmurs, rubs, gallops or clicks. No pedal edema. Gastrointestinal system: Abdomen is nondistended, soft and nontender. No organomegaly or masses felt. Normal bowel sounds heard. Central nervous system: Alert and oriented. No focal neurological deficits. Extremities: Symmetric 5 x 5 power. Skin: No rashes, lesions or ulcers Psychiatry: Judgement and insight appear normal. Mood & affect appropriate.     Data Reviewed: I have personally reviewed following labs and imaging studies  CBC: Recent Labs  Lab 12/31/19 1058 12/31/19 1115 01/02/20 0440 01/02/20 1050 01/02/20 2330 01/03/20 0215 01/03/20 1321 01/03/20 1825 01/04/20 0622  WBC 12.8*   < > 9.0  --  13.1* 15.9*  --  13.3* 10.6*  NEUTROABS 10.6*  --   --   --   --   --   --   --   --   HGB 9.8*   < > 7.4*   < > 7.8* 6.9* 9.6* 9.4* 9.4*  HCT 29.8*   < > 22.0*   < > 22.9* 19.8* 27.4* 27.3* 27.7*  MCV 100.3*   < > 98.2  --  97.4 97.5  --  96.8 97.9  PLT 257   < > 229  --  271 237  --  250 258   < > = values in this interval not displayed.   Basic Metabolic Panel: Recent Labs  Lab 01/01/20 0710 01/02/20 0440 01/02/20 2330 01/03/20 0215 01/04/20 0622  NA 135 137 134* 134* 135  K 4.0 3.8 3.2* 3.6 3.3*  CL 102 104 102 102 98  CO2 23 25 20* 25 28  GLUCOSE 130* 143* 168* 145* 99  BUN 29* 27* 28* 27* 13  CREATININE 1.25* 1.34* 1.34* 1.13 1.30*  CALCIUM 8.4* 8.7* 8.7* 8.6* 8.5*   GFR: Estimated Creatinine Clearance: 38.7 mL/min (A) (by C-G formula based on SCr of 1.3 mg/dL (H)). Liver Function  Tests: Recent Labs  Lab 12/31/19 1058  AST 25  ALT 16  ALKPHOS 48  BILITOT 0.7  PROT 5.6*  ALBUMIN 3.2*   No results for input(s): LIPASE, AMYLASE in the last 168 hours. No results for input(s): AMMONIA in the last 168 hours. Coagulation Profile: Recent Labs  Lab 12/31/19 1058  INR 1.1   Cardiac Enzymes: No results for input(s): CKTOTAL, CKMB, CKMBINDEX, TROPONINI in the last 168 hours. BNP (last 3 results) No results for input(s): PROBNP in the last 8760 hours. HbA1C: No results for input(s): HGBA1C in the last 72 hours. CBG: Recent Labs  Lab 01/02/20 2231  GLUCAP 154*   Lipid Profile: No results for input(s): CHOL, HDL, LDLCALC, TRIG, CHOLHDL, LDLDIRECT in the last 72 hours. Thyroid Function Tests: No results for input(s): TSH, T4TOTAL, FREET4, T3FREE, THYROIDAB in the last 72 hours. Anemia Panel: No results for input(s): VITAMINB12, FOLATE, FERRITIN, TIBC, IRON, RETICCTPCT in the last 72 hours. Sepsis Labs: No results for input(s): PROCALCITON, LATICACIDVEN in the last 168 hours.  Recent Results (from the past 240 hour(s))  Respiratory Panel by RT PCR (Flu A&B, Covid) - Nasopharyngeal Swab     Status: None   Collection Time: 12/31/19 11:19 AM   Specimen: Nasopharyngeal Swab  Result Value Ref Range Status   SARS Coronavirus 2 by RT PCR NEGATIVE NEGATIVE Final    Comment: (NOTE) SARS-CoV-2 target nucleic acids are NOT DETECTED. The SARS-CoV-2 RNA is generally detectable in upper respiratoy specimens during the acute phase of infection. The lowest concentration of SARS-CoV-2 viral copies this assay can detect is 131 copies/mL. A negative result does not preclude SARS-Cov-2 infection and should not be used as the sole basis for treatment or other patient management decisions. A negative result may occur with  improper specimen collection/handling, submission of specimen other than nasopharyngeal swab, presence of viral mutation(s) within the areas targeted by  this assay, and inadequate number of viral copies (<131 copies/mL). A negative result must be combined with clinical observations, patient history, and epidemiological information. The expected result is Negative. Fact Sheet for Patients:  PinkCheek.be Fact Sheet for Healthcare Providers:  GravelBags.it This test is not yet ap proved or cleared by the [place] FDA and  has been authorized for detection and/or diagnosis of SARS-CoV-2 by FDA under an Emergency Use Authorization (EUA). This EUA will remain  in effect (meaning this test can be used) for the duration of the COVID-19 declaration under Section 564(b)(1) of the Act, 21 U.S.C. section 360bbb-3(b)(1), unless the authorization is terminated or revoked sooner.    Influenza A by PCR NEGATIVE NEGATIVE Final   Influenza B by PCR NEGATIVE NEGATIVE Final    Comment: (NOTE) The Xpert Xpress SARS-CoV-2/FLU/RSV assay is intended as an aid in  the diagnosis of influenza from Nasopharyngeal swab specimens and  should not be used as a sole basis for treatment. Nasal washings and  aspirates are unacceptable for Xpert Xpress SARS-CoV-2/FLU/RSV  testing. Fact Sheet for Patients: PinkCheek.be Fact Sheet for Healthcare Providers: GravelBags.it This test is not yet approved or cleared by the [place] FDA and  has been authorized for detection and/or diagnosis of SARS-CoV-2 by  FDA under an Emergency Use Authorization (EUA). This EUA will remain  in effect (meaning this test can be used) for the duration of the  Covid-19 declaration under Section 564(b)(1) of the Act, 21  U.S.C. section 360bbb-3(b)(1), unless the authorization is  terminated or revoked. Performed at [facility], [place] [address], [place], [place] [address]          Radiology Studies: DG CHEST PORT 1 VIEW  Result Date: 01/03/2020 CLINICAL  DATA:  Shortness of breath. EXAM: PORTABLE CHEST 1 VIEW COMPARISON:  09/14/2010, lung bases from abdominal CTA 12/31/2019 FINDINGS: Implanted loop recorder in the left chest wall. Small left lung base opacity on prior CT are not visualized radiographically. Upper normal heart size with normal mediastinal contours. Aortic atherosclerosis. Focal airspace disease. No pulmonary edema, pleural effusion, or pneumothorax. Chronic changes of both shoulders. No acute osseous abnormalities are seen. IMPRESSION: No acute abnormality. Small left lung base  opacity on prior CT are not visualized radiographically. Aortic Atherosclerosis (ICD10-I70.0). Electronically Signed   By: Keith Rake M.D.   On: 01/03/2020 00:01        Scheduled Meds: . ezetimibe  10 mg Oral q1800   And  . simvastatin  20 mg Oral q1800  . ferrous sulfate  325 mg Oral Q breakfast  . fluticasone  1 spray Each Nare Daily  . gabapentin  300 mg Oral BID  . levothyroxine  100 mcg Oral Daily  . lisinopril  20 mg Oral Daily  . pantoprazole  40 mg Intravenous Q12H  . polyethylene glycol-electrolytes  4,000 mL Oral Once  . sodium chloride flush  3 mL Intravenous Q12H   Continuous Infusions: . sodium chloride    . lactated ringers 75 mL/hr at 01/04/20 0724     LOS: 2 days    Time spent: 30 minutes    Shareece Bultman Darleen Crocker, DO [facility] Pager [phone]  If 7PM-7AM, please contact night-coverage [email or web address] Password [email or web address] 01/04/2020, 12:12 PM

## 2020-01-05 ENCOUNTER — Inpatient Hospital Stay (HOSPITAL_COMMUNITY): Payer: Medicare Other | Admitting: Anesthesiology

## 2020-01-05 ENCOUNTER — Encounter (HOSPITAL_COMMUNITY)
Admission: EM | Disposition: A | Discharge status: Home / Self Care | Payer: Self-pay | Source: Home / Self Care | Attending: Family Medicine

## 2020-01-05 ENCOUNTER — Encounter (HOSPITAL_COMMUNITY): Payer: Self-pay | Admitting: Internal Medicine

## 2020-01-05 HISTORY — PX: COLONOSCOPY: SHX5424

## 2020-01-05 LAB — BASIC METABOLIC PANEL
Anion gap: 9 (ref 5–15)
BUN: 12 mg/dL (ref 8–23)
CO2: 27 mmol/L (ref 22–32)
Calcium: 8.3 mg/dL — ABNORMAL LOW (ref 8.9–10.3)
Chloride: 99 mmol/L (ref 98–111)
Creatinine, Ser: 1.23 mg/dL (ref 0.61–1.24)
GFR calc Af Amer: >60 mL/min (ref >60)
GFR calc non Af Amer: 52 mL/min — ABNORMAL LOW (ref >60)
Glucose, Bld: 95 mg/dL (ref 70–99)
Potassium: 3.7 mmol/L (ref 3.5–5.1)
Sodium: 135 mmol/L (ref 135–145)

## 2020-01-05 LAB — CBC
HCT: 27.3 % — ABNORMAL LOW (ref 39.0–52.0)
Hemoglobin: 9.1 g/dL — ABNORMAL LOW (ref 13.0–17.0)
MCH: 32.6 pg (ref 26.0–34.0)
MCHC: 33.3 g/dL (ref 30.0–36.0)
MCV: 97.8 fL (ref 80.0–100.0)
Platelets: 269 10*3/uL (ref 150–400)
RBC: 2.79 MIL/uL — ABNORMAL LOW (ref 4.22–5.81)
RDW: 14.2 % (ref 11.5–15.5)
WBC: 11.2 10*3/uL — ABNORMAL HIGH (ref 4.0–10.5)
nRBC: 0 % (ref 0.0–0.2)

## 2020-01-05 LAB — MAGNESIUM: Magnesium: 1.2 mg/dL — ABNORMAL LOW (ref 1.7–2.4)

## 2020-01-05 SURGERY — COLONOSCOPY
Anesthesia: Monitor Anesthesia Care

## 2020-01-05 MED ORDER — PROPOFOL 10 MG/ML IV BOLUS
INTRAVENOUS | Status: DC | PRN
  Administered 2020-01-05 (×2): 25 mg via INTRAVENOUS

## 2020-01-05 MED ORDER — PHENYLEPHRINE 40 MCG/ML (10ML) SYRINGE FOR IV PUSH (FOR BLOOD PRESSURE SUPPORT)
PREFILLED_SYRINGE | INTRAVENOUS | Status: DC | PRN
  Administered 2020-01-05: 80 ug via INTRAVENOUS

## 2020-01-05 MED ORDER — PROPOFOL 500 MG/50ML IV EMUL
INTRAVENOUS | Status: DC | PRN
  Administered 2020-01-05: 100 ug/kg/min via INTRAVENOUS

## 2020-01-05 NOTE — Discharge Summary (Signed)
Physician Discharge Summary  Roy David [id number] DOB: 1930/09/30 DOA: 12/31/2019  PCP: [name], L.Marlou Sa, MD  Admit date: 12/31/2019  Discharge date: 01/05/2020  Admitted From:Home  Disposition:  Home  Recommendations for Outpatient Follow-up:  1. Follow up with PCP in 1-2 weeks 2. Remain off Eliquis and follow up with his cardiologist in 2 weeks Dr. Sallyanne Kuster on 01/18/2020 3. Follow up with GI as scheduled in 2 weeks and follow up H/H to ensure stability 4. Return to hospital if bleeding returns  Home Health:None  Equipment/Devices:None  Discharge Condition:Stable  CODE STATUS: Full  Diet recommendation: Heart Healthy  Brief/Interim Summary: [name] a 86 y.o.malewith medical history significant ofprostate CA; PAF on Xarelto; HTN; AS; and HLD presentedwith lower GI bleeding/bright red blood per rectum since Saturday and also had singlesyncopal episode in triage.He also vomited dark red stuff in triage. He was admitted under hospital service or bright red blood per rectum and GI was consulted. Patient then did not have any bright red blood per rectum for 2 days so he was discharged on 01/02/2020 however right before discharge, he had another large bloody bowel movement so discharge was canceled. Later that night, he also had another episode of syncope while the nurse was in the room and it was thought that he was not having pulse so CPR was started which lasted for less than 30 seconds and patient woke up. RRT was called. He was then transferred to different floor/stepdown. Patient has not had any further GI bleeding since last evening. His hemoglobin dropped to 6.9 this morning and 2 units PRBC transfusion has been ordered.  1/25:No further bleeding or BM noted. Stable hemoglobin/hematrocrit levels noted after transfusion. Plans for colonoscopy in am.  1/26: Patient has undergone colonoscopy with no active bleeding noted. Multiple sites of diverticulosis noted  as per report. Recommendations to remain off Eliquis until further consultation with his cardiologist as noted above. Vital signs have remained stable along with his hemoglobin. Repeat H/H on follow up. Continue other prior home medications.  Discharge Diagnoses:  Principal Problem:   Acute lower GI bleeding Active Problems:   Essential hypertension   Hypercholesterolemia   Paroxysmal atrial fibrillation (HCC)  Acute blood loss anemia secondary to diverticular bleed in the setting of Eliquis.  Discharge Instructions  Discharge Instructions    Diet - low sodium heart healthy   Complete by: As directed    Discharge patient   Complete by: As directed    Discharge disposition: 01-Home or Self Care   Discharge patient date: 01/02/2020   Increase activity slowly   Complete by: As directed      Allergies as of 01/05/2020      Reactions   Bee Venom Nausea Only, Other (See Comments)   Wasp and yellow jackets      Medication List    STOP taking these medications   Eliquis 5 MG Tabs tablet Generic drug: apixaban     TAKE these medications   albuterol 108 (90 Base) MCG/ACT inhaler Commonly known as: VENTOLIN HFA Inhale 1 puff into the lungs daily as needed for wheezing or shortness of breath.   Asmanex (60 Metered Doses) 220 MCG/INH inhaler Generic drug: mometasone Inhale 1 Inhaler into the lungs daily.   cholecalciferol 1000 units tablet Commonly known as: VITAMIN D Take 1,000 Units by mouth daily.   co-enzyme Q-10 30 MG capsule Take 200 mg by mouth daily.   doxycycline 100 MG capsule Commonly known as: MONODOX Take 100 mg by mouth 2 (  two) times daily as needed.   EPINEPHrine 0.3 mg/0.3 mL Soaj injection Commonly known as: EPI-PEN Inject 0.3 mg into the muscle once.   ezetimibe-simvastatin 10-20 MG tablet Commonly known as: VYTORIN Take 1 tablet by mouth daily.   ferrous sulfate 325 (65 FE) MG tablet Take 325 mg by mouth daily with breakfast.   gabapentin 300  MG capsule Commonly known as: NEURONTIN Take 300 mg by mouth 2 (two) times daily.   GLUCOSAMINE-CHONDROITIN PO Take 1 tablet by mouth 2 (two) times daily.   levothyroxine 100 MCG tablet Commonly known as: SYNTHROID Take 100 mcg by mouth daily.   lisinopril-hydrochlorothiazide 20-25 MG tablet Commonly known as: ZESTORETIC Take 1 tablet by mouth daily.   predniSONE 20 MG tablet Commonly known as: DELTASONE Take 20 mg by mouth 2 (two) times daily as needed.   triamcinolone cream 0.1 % Commonly known as: KENALOG Apply 1 application topically 2 (two) times daily as needed (itching).   vitamin B-12 1000 MCG tablet Commonly known as: CYANOCOBALAMIN Take 1,000 mcg by mouth daily.      Follow-up Information    [name], L.Marlou Sa, MD Follow up in 1 week(s).   Specialty: Family Medicine Contact information: [address] [place] [address] [phone]        Croitoru, Dani Gobble, MD. Schedule an appointment as soon as possible for a visit in 2 week(s).   Specialty: Cardiology Contact information: [address] [place] [address] [phone]          Allergies  Allergen Reactions  . Bee Venom Nausea Only and Other (See Comments)    Wasp and yellow jackets    Consultations:  GI   Procedures/Studies: NM GI Blood Loss  Addendum Date: 12/31/2019   ADDENDUM REPORT: 12/31/2019 19:16 ADDENDUM: These results were called by telephone at the time of interpretation on 12/31/2019 at 7:16 pm to provider Hayden Pedro, who verbally acknowledged these results. Electronically Signed   By: Constance Holster M.D.   On: 12/31/2019 19:16   Result Date: 12/31/2019 CLINICAL DATA:  GI bleeding EXAM: NUCLEAR MEDICINE GASTROINTESTINAL BLEEDING SCAN TECHNIQUE: Sequential abdominal images were obtained following intravenous administration of Tc-9m labeled red blood cells. RADIOPHARMACEUTICALS:  Twenty-five mCi Tc-80m pertechnetate in-vitro labeled red  cells. COMPARISON:  None. FINDINGS: There is accumulation of radiotracer in the left upper quadrant in the region of the splenic flexure, early on the study. There is limited flow of radiotracer downstream from this point in the first hour. On the second hour of the study, there is increasing accumulation of radiotracer in the left upper quadrant at the level of the splenic flexure with some vague downstream peristalsis. IMPRESSION: Study is positive for active GI bleeding in the left upper quadrant, likely at the level of the splenic flexure of the colon. Electronically Signed: By: Constance Holster M.D. On: 12/31/2019 19:06   DG CHEST PORT 1 VIEW  Result Date: 01/03/2020 CLINICAL DATA:  Shortness of breath. EXAM: PORTABLE CHEST 1 VIEW COMPARISON:  09/14/2010, lung bases from abdominal CTA 12/31/2019 FINDINGS: Implanted loop recorder in the left chest wall. Small left lung base opacity on prior CT are not visualized radiographically. Upper normal heart size with normal mediastinal contours. Aortic atherosclerosis. Focal airspace disease. No pulmonary edema, pleural effusion, or pneumothorax. Chronic changes of both shoulders. No acute osseous abnormalities are seen. IMPRESSION: No acute abnormality. Small left lung base opacity on prior CT are not visualized radiographically. Aortic Atherosclerosis (ICD10-I70.0). Electronically Signed   By: Threasa Beards  [place] M.D.   On: 01/03/2020 00:01   CT Angio Abd/Pel w/ and/or w/o  Result Date: 12/31/2019 CLINICAL DATA:  GI bleed EXAM: CTA ABDOMEN AND PELVIS WITHOUT AND WITH CONTRAST TECHNIQUE: Multidetector CT imaging of the abdomen and pelvis was performed using the standard protocol during bolus administration of intravenous contrast. Multiplanar reconstructed images and MIPs were obtained and reviewed to evaluate the vascular anatomy. CONTRAST:  33mL OMNIPAQUE IOHEXOL 350 MG/ML SOLN COMPARISON:  None. FINDINGS: VASCULAR Aorta: Atherosclerotic changes are noted  throughout the abdominal aorta without evidence for stenosis or aneurysmal dilatation. Celiac: There is mild-to-moderate narrowing of the proximal celiac axis with some poststenotic dilatation. The splenic artery is unremarkable. The hepatic arterial anatomy appears to be normal. SMA: Patent without evidence of aneurysm, dissection, vasculitis or significant stenosis. Renals: Both renal arteries are patent without evidence of aneurysm, dissection, vasculitis, fibromuscular dysplasia or significant stenosis. IMA: Patent without evidence of aneurysm, dissection, vasculitis or significant stenosis. Inflow: Patent without evidence of aneurysm, dissection, vasculitis or significant stenosis. Proximal Outflow: Bilateral common femoral and visualized portions of the superficial and profunda femoral arteries are patent without evidence of aneurysm, dissection, vasculitis or significant stenosis. Veins: No obvious venous abnormality within the limitations of this arterial phase study. Review of the MIP images confirms the above findings. NON-VASCULAR Lower chest: There is a 1.1 cm airspace opacity at the left lung base (axial series 6, image 13).The heart size is normal. Hepatobiliary: There is a small hypoattenuating nodule within the right hepatic lobe that is favored to represent a benign cyst. Normal gallbladder.There is no biliary ductal dilation. Pancreas: There is a 2.1 x 1.3 cm cystic mass in the pancreatic neck. Spleen: No splenic laceration or hematoma. Adrenals/Urinary Tract: --Adrenal glands: No adrenal hemorrhage. --Right kidney/ureter: No hydronephrosis or perinephric hematoma. --Left kidney/ureter: No hydronephrosis or perinephric hematoma. --Urinary bladder: Unremarkable. Stomach/Bowel: --Stomach/Duodenum: No hiatal hernia or other gastric abnormality. Normal duodenal course and caliber. --Small bowel: No dilatation or inflammation. --Colon: There is severe diverticulosis involving the sigmoid colon,  descending colon, and distal transverse colon. There is hyperdense intraluminal contents in the splenic flexure of the colon. There is no evidence for active arterial extravasation on this study. --Appendix: Normal. Lymphatic: --No retroperitoneal lymphadenopathy. --No mesenteric lymphadenopathy. --No pelvic or inguinal lymphadenopathy. Reproductive: Brachytherapy beads are noted in the prostate gland. Other: No ascites or free air. The abdominal wall is normal. Musculoskeletal. Moderate to severe degenerative changes are noted of the lower lumbar spine. There is no acute displaced fracture. IMPRESSION: 1. No evidence for active arterial GI bleeding on this study. 2. Hyperdense intraluminal fluid is noted in the region of the splenic flexure. This could represent blood products and corresponds well to the prior GI bleeding study. However, again there is no evidence for active arterial extravasation on this study. 3. Severe diverticulosis as detailed above. 4. There is a 2.1 cm cystic mass in the neck of the pancreas. Recommend follow up pre and post contrast MRI/MRCP or pancreatic protocol CT in 2 years. This recommendation follows ACR consensus guidelines: Management of Incidental Pancreatic Cysts: A White Paper of the ACR Incidental Findings Committee. J Am Coll Radiol [id number]. 5. There is a 1.1 cm airspace opacity in the left lower lobe. A follow-up CT of the chest in 3 months is recommended for further evaluation of this finding. 6.  Aortic Atherosclerosis (ICD10-I70.0). Electronically Signed   By: Constance Holster M.D.   On: 12/31/2019 21:16     Discharge Exam: Vitals:  01/05/20 1330 01/05/20 1346  BP: 139/83 (!) 151/90  Pulse: 87   Resp: (!) 28   Temp:  98.3 F (36.8 C)  SpO2: 95% 96%   Vitals:   01/05/20 1110 01/05/20 1317 01/05/20 1330 01/05/20 1346  BP: (!) 174/97 138/83 139/83 (!) 151/90  Pulse: 92 92 87   Resp: 19 (!) 25 (!) 28   Temp: 97.7 F (36.5 C) 98.1 F (36.7 C)   98.3 F (36.8 C)  TempSrc: Oral Temporal  Oral  SpO2: 96% 100% 95% 96%  Weight:      Height:        General: Pt is alert, awake, not in acute distress Cardiovascular: RRR, S1/S2 +, no rubs, no gallops Respiratory: CTA bilaterally, no wheezing, no rhonchi Abdominal: Soft, NT, ND, bowel sounds + Extremities: no edema, no cyanosis    The results of significant diagnostics from this hospitalization (including imaging, microbiology, ancillary and laboratory) are listed below for reference.     Microbiology: Recent Results (from the past 240 hour(s))  Respiratory Panel by RT PCR (Flu A&B, Covid) - Nasopharyngeal Swab     Status: None   Collection Time: 12/31/19 11:19 AM   Specimen: Nasopharyngeal Swab  Result Value Ref Range Status   SARS Coronavirus 2 by RT PCR NEGATIVE NEGATIVE Final    Comment: (NOTE) SARS-CoV-2 target nucleic acids are NOT DETECTED. The SARS-CoV-2 RNA is generally detectable in upper respiratoy specimens during the acute phase of infection. The lowest concentration of SARS-CoV-2 viral copies this assay can detect is 131 copies/mL. A negative result does not preclude SARS-Cov-2 infection and should not be used as the sole basis for treatment or other patient management decisions. A negative result may occur with  improper specimen collection/handling, submission of specimen other than nasopharyngeal swab, presence of viral mutation(s) within the areas targeted by this assay, and inadequate number of viral copies (<131 copies/mL). A negative result must be combined with clinical observations, patient history, and epidemiological information. The expected result is Negative. Fact Sheet for Patients:  PinkCheek.be Fact Sheet for Healthcare Providers:  GravelBags.it This test is not yet ap proved or cleared by the [place] FDA and  has been authorized for detection and/or diagnosis of SARS-CoV-2  by FDA under an Emergency Use Authorization (EUA). This EUA will remain  in effect (meaning this test can be used) for the duration of the COVID-19 declaration under Section 564(b)(1) of the Act, 21 U.S.C. section 360bbb-3(b)(1), unless the authorization is terminated or revoked sooner.    Influenza A by PCR NEGATIVE NEGATIVE Final   Influenza B by PCR NEGATIVE NEGATIVE Final    Comment: (NOTE) The Xpert Xpress SARS-CoV-2/FLU/RSV assay is intended as an aid in  the diagnosis of influenza from Nasopharyngeal swab specimens and  should not be used as a sole basis for treatment. Nasal washings and  aspirates are unacceptable for Xpert Xpress SARS-CoV-2/FLU/RSV  testing. Fact Sheet for Patients: PinkCheek.be Fact Sheet for Healthcare Providers: GravelBags.it This test is not yet approved or cleared by the [place] FDA and  has been authorized for detection and/or diagnosis of SARS-CoV-2 by  FDA under an Emergency Use Authorization (EUA). This EUA will remain  in effect (meaning this test can be used) for the duration of the  Covid-19 declaration under Section 564(b)(1) of the Act, 21  U.S.C. section 360bbb-3(b)(1), unless the authorization is  terminated or revoked. Performed at [facility], [place] [address], [place], [place] [address]  Labs: BNP (last 3 results) No results for input(s): BNP in the last 8760 hours. Basic Metabolic Panel: Recent Labs  Lab 01/02/20 0440 01/02/20 2330 01/03/20 0215 01/04/20 0622 01/05/20 0602  NA 137 134* 134* 135 135  K 3.8 3.2* 3.6 3.3* 3.7  CL 104 102 102 98 99  CO2 25 20* 25 28 27   GLUCOSE 143* 168* 145* 99 95  BUN 27* 28* 27* 13 12  CREATININE 1.34* 1.34* 1.13 1.30* 1.23  CALCIUM 8.7* 8.7* 8.6* 8.5* 8.3*  MG  --   --   --   --  1.2*   Liver Function Tests: Recent Labs  Lab 12/31/19 1058  AST 25  ALT 16  ALKPHOS 48  BILITOT 0.7  PROT 5.6*  ALBUMIN 3.2*    No results for input(s): LIPASE, AMYLASE in the last 168 hours. No results for input(s): AMMONIA in the last 168 hours. CBC: Recent Labs  Lab 12/31/19 1058 12/31/19 1115 01/03/20 0215 01/03/20 0215 01/03/20 1321 01/03/20 1825 01/04/20 0622 01/04/20 1839 01/05/20 0602  WBC 12.8*   < > 15.9*  --   --  13.3* 10.6* 11.3* 11.2*  NEUTROABS 10.6*  --   --   --   --   --   --   --   --   HGB 9.8*   < > 6.9*   < > 9.6* 9.4* 9.4* 9.5* 9.1*  HCT 29.8*   < > 19.8*   < > 27.4* 27.3* 27.7* 28.4* 27.3*  MCV 100.3*   < > 97.5  --   --  96.8 97.9 97.9 97.8  PLT 257   < > 237  --   --  250 258 267 269   < > = values in this interval not displayed.   Cardiac Enzymes: No results for input(s): CKTOTAL, CKMB, CKMBINDEX, TROPONINI in the last 168 hours. BNP: Invalid input(s): POCBNP CBG: Recent Labs  Lab 01/02/20 2231  GLUCAP 154*   D-Dimer No results for input(s): DDIMER in the last 72 hours. Hgb A1c No results for input(s): HGBA1C in the last 72 hours. Lipid Profile No results for input(s): CHOL, HDL, LDLCALC, TRIG, CHOLHDL, LDLDIRECT in the last 72 hours. Thyroid function studies No results for input(s): TSH, T4TOTAL, T3FREE, THYROIDAB in the last 72 hours.  Invalid input(s): FREET3 Anemia work up No results for input(s): VITAMINB12, FOLATE, FERRITIN, TIBC, IRON, RETICCTPCT in the last 72 hours. Urinalysis No results found for: COLORURINE, APPEARANCEUR, [place], [place], [place], [place], [place], [place], PROTEINUR, UROBILINOGEN, NITRITE, LEUKOCYTESUR Sepsis Labs Invalid input(s): PROCALCITONIN,  WBC,  LACTICIDVEN Microbiology Recent Results (from the past 240 hour(s))  Respiratory Panel by RT PCR (Flu A&B, Covid) - Nasopharyngeal Swab     Status: None   Collection Time: 12/31/19 11:19 AM   Specimen: Nasopharyngeal Swab  Result Value Ref Range Status   SARS Coronavirus 2 by RT PCR NEGATIVE NEGATIVE Final    Comment: (NOTE) SARS-CoV-2 target nucleic acids are NOT  DETECTED. The SARS-CoV-2 RNA is generally detectable in upper respiratoy specimens during the acute phase of infection. The lowest concentration of SARS-CoV-2 viral copies this assay can detect is 131 copies/mL. A negative result does not preclude SARS-Cov-2 infection and should not be used as the sole basis for treatment or other patient management decisions. A negative result may occur with  improper specimen collection/handling, submission of specimen other than nasopharyngeal swab, presence of viral mutation(s) within the areas targeted by this assay, and inadequate number of viral copies (<131 copies/mL).  A negative result must be combined with clinical observations, patient history, and epidemiological information. The expected result is Negative. Fact Sheet for Patients:  PinkCheek.be Fact Sheet for Healthcare Providers:  GravelBags.it This test is not yet ap proved or cleared by the [place] FDA and  has been authorized for detection and/or diagnosis of SARS-CoV-2 by FDA under an Emergency Use Authorization (EUA). This EUA will remain  in effect (meaning this test can be used) for the duration of the COVID-19 declaration under Section 564(b)(1) of the Act, 21 U.S.C. section 360bbb-3(b)(1), unless the authorization is terminated or revoked sooner.    Influenza A by PCR NEGATIVE NEGATIVE Final   Influenza B by PCR NEGATIVE NEGATIVE Final    Comment: (NOTE) The Xpert Xpress SARS-CoV-2/FLU/RSV assay is intended as an aid in  the diagnosis of influenza from Nasopharyngeal swab specimens and  should not be used as a sole basis for treatment. Nasal washings and  aspirates are unacceptable for Xpert Xpress SARS-CoV-2/FLU/RSV  testing. Fact Sheet for Patients: PinkCheek.be Fact Sheet for Healthcare Providers: GravelBags.it This test is not yet approved or cleared  by the [place] FDA and  has been authorized for detection and/or diagnosis of SARS-CoV-2 by  FDA under an Emergency Use Authorization (EUA). This EUA will remain  in effect (meaning this test can be used) for the duration of the  Covid-19 declaration under Section 564(b)(1) of the Act, 21  U.S.C. section 360bbb-3(b)(1), unless the authorization is  terminated or revoked. Performed at [facility], [place] [address], [place], [place] [address]      Time coordinating discharge: 35 minutes  SIGNED:   Rodena Goldmann, DO [facility] 01/05/2020, 3:41 PM   If 7PM-7AM, please contact night-coverage [email or web address]

## 2020-01-05 NOTE — Plan of Care (Signed)
Continue to monitor

## 2020-01-05 NOTE — Anesthesia Procedure Notes (Signed)
Procedure Name: MAC Date/Time: 01/05/2020 12:48 PM Performed by: Imagene Riches, CRNA Pre-anesthesia Checklist: Patient identified, Suction available, Emergency Drugs available, Patient being monitored and Timeout performed Patient Re-evaluated:Patient Re-evaluated prior to induction Oxygen Delivery Method: Nasal cannula

## 2020-01-05 NOTE — Op Note (Signed)
[facility] Patient Name: Roy David Procedure Date : 01/05/2020 MRN: [id number] Attending MD: Clarene Essex , MD Date of Birth: 1930-09-24 CSN: [id number] Age: 86 Admit Type: Inpatient Procedure:                Colonoscopy Indications:              Last colonoscopy: 2016, Hematochezia Providers:                Clarene Essex, MD, Baird Cancer, RN, Corie Chiquito,                            Technician, Lazaro Arms, Technician, Imagene Riches,                            CRNA Referring MD:              Medicines:                Propofol total dose [id number] mg IV Complications:            No immediate complications. Estimated Blood Loss:     Estimated blood loss: none. Procedure:                Pre-Anesthesia Assessment:                           - Prior to the procedure, a History and Physical                            was performed, and patient medications and                            allergies were reviewed. The patient's tolerance of                            previous anesthesia was also reviewed. The risks                            and benefits of the procedure and the sedation                            options and risks were discussed with the patient.                            All questions were answered, and informed consent                            was obtained. Prior Anticoagulants: The patient has                            taken Eliquis (apixaban), last dose was 6 days                            prior to procedure. ASA Grade Assessment: III - A  patient with severe systemic disease. After                            reviewing the risks and benefits, the patient was                            deemed in satisfactory condition to undergo the                            procedure.                           After obtaining informed consent, the colonoscope                            was passed under direct vision. Throughout the           procedure, the patient's blood pressure, pulse, and                            oxygen saturations were monitored continuously. The                            [id number] Olympus pediatric colonscope                            was introduced through the anus and advanced to the                            the cecum, identified by appendiceal orifice and                            ileocecal valve. The ileocecal valve, appendiceal                            orifice, and rectum were photographed. The                            colonoscopy was somewhat difficult due to multiple                            diverticula in the colon, inadequate bowel prep,                            significant looping and a tortuous colon.                            Successful completion of the procedure was aided by                            applying abdominal pressure. The patient tolerated                            the procedure well. The quality of the bowel  preparation was fair. Scope In: 12:49:03 PM Scope Out: 1:11:09 PM Scope Withdrawal Time: 0 hours 6 minutes 24 seconds  Total Procedure Duration: 0 hours 22 minutes 6 seconds  Findings:      Multiple small and large-mouthed diverticula were found in the sigmoid       colon, descending colon and splenic flexure.      The exam was otherwise without abnormality. Impression:               - Preparation of the colon was fair.                           - Diverticulosis in the sigmoid colon, in the                            descending colon and at the splenic flexure.                           - The examination was otherwise normal.                           - No specimens collected. Recommendation:           - Patient has a contact number available for                            emergencies. The signs and symptoms of potential                            delayed complications were discussed with the                             patient. Return to normal activities tomorrow.                            Written discharge instructions were provided to the                            patient.                           - Soft diet today.                           - Continue present medications.                           - Repeat colonoscopy PRN for screening purposes.                           - Return to GI office PRN.                           - Telephone GI clinic if symptomatic PRN. Procedure Code(s):        --- Professional ---  45378, Colonoscopy, flexible; diagnostic, including                            collection of specimen(s) by brushing or washing,                            when performed (separate procedure) Diagnosis Code(s):        --- Professional ---                           K92.1, Melena (includes Hematochezia)                           K57.30, Diverticulosis of large intestine without                            perforation or abscess without bleeding CPT copyright 2019 [employer]. All rights reserved. The codes documented in this report are preliminary and upon coder review may  be revised to meet current compliance requirements. Clarene Essex, MD 01/05/2020 1:18:42 PM This report has been signed electronically. Number of Addenda: 0

## 2020-01-05 NOTE — Transfer of Care (Signed)
Immediate Anesthesia Transfer of Care Note  Patient: Roy David  Procedure(s) Performed: COLONOSCOPY (N/A )  Patient Location: Endoscopy Unit  Anesthesia Type:MAC  Level of Consciousness: drowsy  Airway & Oxygen Therapy: Patient Spontanous Breathing and Patient connected to nasal cannula oxygen  Post-op Assessment: Report given to RN and Post -op Vital signs reviewed and stable  Post vital signs: Reviewed and stable  Last Vitals:  Vitals Value Taken Time  BP 138/83 01/05/20 1317  Temp    Pulse 87 01/05/20 1317  Resp 20 01/05/20 1317  SpO2 99 % 01/05/20 1317  Vitals shown include unvalidated device data.  Last Pain:  Vitals:   01/05/20 1110  TempSrc: Oral  PainSc: 0-No pain      Patients Stated Pain Goal: 0 (48/40/39 7953)  Complications: No apparent anesthesia complications

## 2020-01-05 NOTE — Progress Notes (Signed)
Patient back to room at this time. V/s and assessment complete. Wife at bedside. Will continue to monitor.  Emelda Fear, RN

## 2020-01-05 NOTE — Anesthesia Preprocedure Evaluation (Signed)
Anesthesia Evaluation  Patient identified by MRN, date of birth, ID band Patient awake    Reviewed: Allergy & Precautions, H&P , NPO status , Patient's Chart, lab work & pertinent test results  Airway Mallampati: II  TM Distance: >3 FB Neck ROM: Full    Dental no notable dental hx. (+) Teeth Intact, Dental Advisory Given   Pulmonary asthma , former smoker,    Pulmonary exam normal breath sounds clear to auscultation       Cardiovascular hypertension, Pt. on medications + dysrhythmias Atrial Fibrillation + Valvular Problems/Murmurs AS  Rhythm:Irregular Rate:Normal     Neuro/Psych negative neurological ROS  negative psych ROS   GI/Hepatic negative GI ROS, Neg liver ROS,   Endo/Other  negative endocrine ROS  Renal/GU negative Renal ROS  negative genitourinary   Musculoskeletal  (+) Arthritis , Osteoarthritis,    Abdominal   Peds  Hematology  (+) Blood dyscrasia, anemia ,   Anesthesia Other Findings   Reproductive/Obstetrics negative OB ROS                             Anesthesia Physical Anesthesia Plan  ASA: III  Anesthesia Plan: MAC   Post-op Pain Management:    Induction: Intravenous  PONV Risk Score and Plan: 1 and Propofol infusion and Treatment may vary due to age or medical condition  Airway Management Planned: Simple Face Mask  Additional Equipment:   Intra-op Plan:   Post-operative Plan:   Informed Consent: I have reviewed the patients History and Physical, chart, labs and discussed the procedure including the risks, benefits and alternatives for the proposed anesthesia with the patient or authorized representative who has indicated his/her understanding and acceptance.     Dental advisory given  Plan Discussed with: CRNA  Anesthesia Plan Comments:         Anesthesia Quick Evaluation

## 2020-01-05 NOTE — Progress Notes (Signed)
Roy David 12:40 PM  Subjective: Patient without signs of bleeding and no new complaints and his back is better  Objective: Vital signs stable afebrile no acute distress exam please see preassessment evaluation hemoglobin stable BUN and creatinine fine Assessment: Probable resolved diverticular bleeding  Plan: Okay to proceed with colonoscopy with anesthesia assistance  [facility] E  office [phone] After 5PM or if no answer call [phone]

## 2020-01-06 ENCOUNTER — Encounter: Payer: Self-pay | Admitting: *Deleted

## 2020-01-12 NOTE — Anesthesia Postprocedure Evaluation (Signed)
Anesthesia Post Note  Patient: Roy David  Procedure(s) Performed: COLONOSCOPY (N/A )     Patient location during evaluation: Endoscopy Anesthesia Type: MAC Level of consciousness: awake and alert Pain management: pain level controlled Vital Signs Assessment: post-procedure vital signs reviewed and stable Respiratory status: spontaneous breathing, nonlabored ventilation, respiratory function stable and patient connected to nasal cannula oxygen Cardiovascular status: stable and blood pressure returned to baseline Postop Assessment: no apparent nausea or vomiting Anesthetic complications: no    Last Vitals:  Vitals:   01/05/20 1346 01/05/20 1600  BP: (!) 151/90   Pulse:    Resp:  (!) 24  Temp: 36.8 C   SpO2: 96%     Last Pain:  Vitals:   01/05/20 1346  TempSrc: Oral  PainSc:                  Roy David

## 2020-01-18 ENCOUNTER — Ambulatory Visit: Payer: Medicare Other | Attending: Internal Medicine

## 2020-01-18 ENCOUNTER — Encounter: Payer: Medicare Other | Admitting: Cardiovascular Disease

## 2020-01-18 DIAGNOSIS — Z23 Encounter for immunization: Secondary | ICD-10-CM | POA: Insufficient documentation

## 2020-01-18 NOTE — Progress Notes (Signed)
   Covid-19 Vaccination Clinic  Name:  Roy David    MRN: [id number] DOB: 07/23/1930  01/18/2020  [name] was observed post Covid-19 immunization for 15 minutes without incidence. He was provided with Vaccine Information Sheet and instruction to access the V-Safe system.   [name] was instructed to call 911 with any severe reactions post vaccine: Marland Kitchen Difficulty breathing  . Swelling of your face and throat  . A fast heartbeat  . A bad rash all over your body  . Dizziness and weakness    Immunizations Administered    Name Date Dose VIS Date Route   Pfizer COVID-19 Vaccine 01/18/2020  3:27 PM 0.3 mL 11/20/2019 Intramuscular   Manufacturer: [place]   Lot: [id number]   [place]: [id number]

## 2020-01-22 ENCOUNTER — Telehealth: Payer: Self-pay | Admitting: *Deleted

## 2020-01-22 NOTE — Telephone Encounter (Signed)
The patient has been called and verbalized his understanding. He will call back before his appointment on 3/29 if anything is needed.

## 2020-01-22 NOTE — Telephone Encounter (Signed)
-----   Message from Sanda Klein, MD sent at 01/21/2020 11:13 AM EST ----- Please let him know that I reviewed his records from his recent hospitalization and I agree that he should not take anticoagulants for a while, but I am concerned about his long-term risk for stroke if we do not restart the Eliquis.  I think we need to give him time to build back his hemoglobin to normal levels and will discuss restarting Eliquis at his follow-up appointment which is scheduled in late March, after his echocardiogram. (this is [name] Benecke's husband)

## 2020-02-12 ENCOUNTER — Ambulatory Visit: Payer: Medicare Other | Attending: Internal Medicine

## 2020-02-12 DIAGNOSIS — Z23 Encounter for immunization: Secondary | ICD-10-CM

## 2020-02-12 NOTE — Progress Notes (Signed)
   Covid-19 Vaccination Clinic  Name:  Roy David    MRN: [id number] DOB: 07/25/1930  02/12/2020  [name] was observed post Covid-19 immunization for 15 minutes without incident. He was provided with Vaccine Information Sheet and instruction to access the V-Safe system.   [name] was instructed to call 911 with any severe reactions post vaccine: Marland Kitchen Difficulty breathing  . Swelling of face and throat  . A fast heartbeat  . A bad rash all over body  . Dizziness and weakness   Immunizations Administered    Name Date Dose VIS Date Route   Pfizer COVID-19 Vaccine 02/12/2020 11:37 AM 0.3 mL 11/20/2019 Intramuscular   Manufacturer: [place]   Lot: [id number]   [place]: [id number]

## 2020-02-29 ENCOUNTER — Other Ambulatory Visit: Payer: Self-pay

## 2020-02-29 ENCOUNTER — Ambulatory Visit (HOSPITAL_COMMUNITY): Payer: Medicare Other | Attending: Cardiovascular Disease

## 2020-02-29 DIAGNOSIS — I480 Paroxysmal atrial fibrillation: Secondary | ICD-10-CM | POA: Diagnosis not present

## 2020-02-29 DIAGNOSIS — I350 Nonrheumatic aortic (valve) stenosis: Secondary | ICD-10-CM

## 2020-03-07 ENCOUNTER — Encounter: Payer: Self-pay | Admitting: Cardiovascular Disease

## 2020-03-07 ENCOUNTER — Ambulatory Visit: Payer: Medicare Other | Admitting: Cardiovascular Disease

## 2020-03-07 ENCOUNTER — Other Ambulatory Visit: Payer: Self-pay

## 2020-03-07 VITALS — BP 160/90 | HR 79 | Ht 68.5 in | Wt 155.0 lb

## 2020-03-07 DIAGNOSIS — E7800 Pure hypercholesterolemia, unspecified: Secondary | ICD-10-CM

## 2020-03-07 DIAGNOSIS — I350 Nonrheumatic aortic (valve) stenosis: Secondary | ICD-10-CM | POA: Diagnosis not present

## 2020-03-07 DIAGNOSIS — I10 Essential (primary) hypertension: Secondary | ICD-10-CM

## 2020-03-07 DIAGNOSIS — D500 Iron deficiency anemia secondary to blood loss (chronic): Secondary | ICD-10-CM | POA: Diagnosis not present

## 2020-03-07 DIAGNOSIS — I480 Paroxysmal atrial fibrillation: Secondary | ICD-10-CM

## 2020-03-07 DIAGNOSIS — Z4509 Encounter for adjustment and management of other cardiac device: Secondary | ICD-10-CM

## 2020-03-07 NOTE — Progress Notes (Addendum)
. Patient ID: Roy David, male   DOB: 1930-04-13, 86 y.o.   MRN: [id number]    Cardiology Office Note    Date:  03/07/2020   ID:  Roy David, DOB 06/18/1930, MRN [id number]  PCP:  [name], L.Marlou Sa, MD  Cardiologist:   Sanda Klein, MD   Chief Complaint  Patient presents with  . Cardiac Valve Problem    History of Present Illness:  Roy David is a 86 y.o. 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  Has known moderate aortic stenosis, asymptomatic.    He developed iron deficiency anemia due to microscopic GI bleeding while on Xarelto and had acute lower GI bleeding in January 2021, on Eliquis. He received 2 units of blood transfusion and his hemoglobin stabilized around 9.5. Colonoscopy did not show active bleeding but reported multiple sites of diverticulosis. He has not had GI bleeding since.  After his last appointment his loop recorder has reached end of service. Throughout the duration of monitoring his overall burden of asymptomatic paroxysmal atrial fibrillation was around 2%. Rate control was always good. He was never aware of palpitations.  The patient specifically denies any chest pain at rest exertion, dyspnea at rest or with exertion, orthopnea, paroxysmal nocturnal dyspnea, syncope, palpitations, focal neurological deficits, intermittent claudication, lower extremity edema, unexplained weight gain, cough, hemoptysis or wheezing.  Physical activity is limited more by bilateral knee pain (he sees Dr. Wynelle Link). He is able to perform yard work and household chores without shortness of breath, chest tightness or dizziness/syncope. He usually has to stop and rest because of knee pain when he works in the yard.  His echocardiogram performed last week continues to show moderate aortic stenosis as  well as moderate aortic insufficiency. The gradients are lower than at his previous assessment, likely due to change in hemoglobin levels and cardiac output. Peak gradient 50 mmHg, mean gradient 29 mmHg. The estimated valve area is around 1.0 cm and the dimensionless obstructive index is 0.31. He continues to have normal left ventricular systolic function.    Past Medical History:  Diagnosis Date  . Arthritis   . Asthma    followed at [facility], stable on Asmanex  . Diverticulosis   . Dysrhythmia   . H/O seasonal allergies    Dr [name]  . Hypercholesteremia   . Hypertension   . Hypochromic-microcytic anemia   . Microcytic anemia   . Paroxysmal atrial fibrillation ([place]) 03/06/2016   on Xarelto  . Prostate cancer [facility])    12/10- Dr Dahlstedt/Dr Valere Dross  . Thyroid disease    hypothyroidism    Past Surgical History:  Procedure Laterality Date  . APPENDECTOMY    . COLONOSCOPY N/A 01/05/2020   Procedure: COLONOSCOPY;  Surgeon: Clarene Essex, MD;  Location: [facility];  Service: Endoscopy;  Laterality: N/A;  . EP IMPLANTABLE DEVICE N/A 11/29/2015   Procedure: Loop Recorder Insertion;  Surgeon: Sanda Klein, MD;  Location: [facility];  Service: Cardiovascular;  Laterality: N/A;  . HERNIA REPAIR    . RETINAL DETACHMENT SURGERY      Outpatient Medications Prior to Visit  Medication Sig Dispense Refill  . albuterol (VENTOLIN HFA) 108 (90 Base) MCG/ACT inhaler Inhale 1 puff into the lungs daily as needed for wheezing or shortness of breath.     Marland Kitchen aspirin EC 81 MG tablet Take 81 mg by  mouth daily.    . cholecalciferol (VITAMIN D) 1000 units tablet Take 1,000 Units by mouth daily.     Marland Kitchen co-enzyme Q-10 30 MG capsule Take 200 mg by mouth daily.    Marland Kitchen doxycycline (MONODOX) 100 MG capsule Take 100 mg by mouth 2 (two) times daily as needed.    Marland Kitchen EPINEPHrine 0.3 mg/0.3 mL IJ SOAJ injection Inject 0.3 mg into the muscle once.    . ezetimibe-simvastatin (VYTORIN) 10-20 MG per tablet Take 1  tablet by mouth daily.    . ferrous sulfate 325 (65 FE) MG tablet Take 325 mg by mouth daily with breakfast.    . gabapentin (NEURONTIN) 300 MG capsule Take 300 mg by mouth 2 (two) times daily.     Marland Kitchen GLUCOSAMINE-CHONDROITIN PO Take 1 tablet by mouth 2 (two) times daily.    Marland Kitchen levothyroxine (SYNTHROID, LEVOTHROID) 100 MCG tablet Take 100 mcg by mouth daily.     Marland Kitchen lisinopril-hydrochlorothiazide (PRINZIDE,ZESTORETIC) 20-25 MG tablet Take 1 tablet by mouth daily.    . mometasone (ASMANEX 60 METERED DOSES) 220 MCG/INH inhaler Inhale 1 Inhaler into the lungs daily.    . predniSONE (DELTASONE) 20 MG tablet Take 20 mg by mouth 2 (two) times daily as needed.     . triamcinolone cream (KENALOG) 0.1 % Apply 1 application topically 2 (two) times daily as needed (itching).     . vitamin B-12 (CYANOCOBALAMIN) 1000 MCG tablet Take 1,000 mcg by mouth daily.     No facility-administered medications prior to visit.     Allergies:   Bee venom   Social History   Socioeconomic History  . Marital status: Married    Spouse name: Not on file  . Number of children: Not on file  . Years of education: Not on file  . Highest education level: Not on file  Occupational History  . Occupation: retired  Tobacco Use  . Smoking status: Former Smoker    Quit date: 12/09/1959    Years since quitting: 60.2  . Smokeless tobacco: Never Used  Substance and Sexual Activity  . Alcohol use: Yes    Alcohol/week: 14.0 standard drinks    Types: 7 Shots of liquor, 7 Cans of beer per week  . Drug use: No  . Sexual activity: Not on file  Other Topics Concern  . Not on file  Social History Narrative   Tobacco use cigarettes : Former smoker, Quit in year 1955   Occupation : retired/  married   Social Determinants of Radio broadcast assistant Strain:   . Difficulty of Paying Living Expenses:   Food Insecurity:   . Worried About Charity fundraiser in the Last Year:   . Arboriculturist in the Last Year:     Transportation Needs:   . Film/video editor (Medical):   Marland Kitchen Lack of Transportation (Non-Medical):   Physical Activity:   . Days of Exercise per Week:   . Minutes of Exercise per Session:   Stress:   . Feeling of Stress :   Social Connections:   . Frequency of Communication with Friends and Family:   . Frequency of Social Gatherings with Friends and Family:   . Attends Religious Services:   . Active Member of Clubs or Organizations:   . Attends Archivist Meetings:   Marland Kitchen Marital Status:      Family History:  The patient's family history includes Heart attack in his mother; Stroke in his father and sister.  ROS:   Please see the history of present illness.    ROS All other systems are reviewed and are negative  PHYSICAL EXAM:   VS:  BP (!) 160/90   Pulse 79   Ht 5' 8.5" (1.74 m)   Wt 155 lb (70.3 kg)   SpO2 98%   BMI 23.22 kg/m       General: Alert, oriented x3, no distress, appears well, fit for his age Head: no evidence of trauma, PERRL, EOMI, no exophtalmos or lid lag, no myxedema, no xanthelasma; normal ears, nose and oropharynx Neck: normal jugular venous pulsations and no hepatojugular reflux; brisk carotid pulses without delay and no carotid bruits Chest: clear to auscultation, no signs of consolidation by percussion or palpation, normal fremitus, symmetrical and full respiratory excursions Cardiovascular: normal position and quality of the apical impulse, regular rhythm, normal first and second heart sounds, mid peaking 3/6 systolic ejection murmur heard best in the right upper sternal border but radiating broadly across the entire chest no diastolic murmurs, rubs or gallops Abdomen: no tenderness or distention, no masses by palpation, no abnormal pulsatility or arterial bruits, normal bowel sounds, no hepatosplenomegaly Extremities: no clubbing, cyanosis or edema; 2+ radial, ulnar and brachial pulses bilaterally; 2+ right femoral, posterior tibial and  dorsalis pedis pulses; 2+ left femoral, posterior tibial and dorsalis pedis pulses; no subclavian or femoral bruits Neurological: grossly nonfocal Psych: Normal mood and affect  Wt Readings from Last 3 Encounters:  03/07/20 155 lb (70.3 kg)  01/03/20 154 lb (69.9 kg)  01/16/19 164 lb 12.8 oz (74.8 kg)      Studies/Labs Reviewed:   ECHO 02/29/2020: 1. Left ventricular ejection fraction, by estimation, is 55 to 60%. The  left ventricle has normal function. The left ventricle has no regional  wall motion abnormalities. There is mild left ventricular hypertrophy.  Left ventricular diastolic parameters  are consistent with Grade I diastolic dysfunction (impaired relaxation).  The average left ventricular global longitudinal strain is -18.7 %.  2. Right ventricular systolic function is normal. The right ventricular  size is normal. There is normal pulmonary artery systolic pressure.  3. Left atrial size was moderately dilated.  4. The mitral valve is normal in structure. Mild to moderate mitral valve  regurgitation.  5. The aortic valve is tricuspid. Aortic valve regurgitation is moderate.  Moderate aortic valve stenosis.  6. Aortic dilatation noted. There is mild dilatation of the ascending  aorta measuring 41 mm.  EKG:  EKG is ordered today. It shows sinus rhythm with mild first-degree AV block (PR 212 ms), no repolarization abnormalities, QTC 449 ms LABS   CBC:    Component Value Date/Time   WBC 11.2 (H) 01/05/2020 0602   HGB 9.1 (L) 01/05/2020 0602   HGB 14.6 11/24/2018 1047   HCT 27.3 (L) 01/05/2020 0602   HCT 42.9 10/15/2018 1037   PLT 269 01/05/2020 0602   PLT 296 10/15/2018 1037   MCV 97.8 01/05/2020 0602   MCV 95 10/15/2018 1037   NEUTROABS 10.6 (H) 12/31/2019 1058   LYMPHSABS 0.9 12/31/2019 1058   MONOABS 1.0 12/31/2019 1058   EOSABS 0.1 12/31/2019 1058   BASOSABS 0.1 12/31/2019 1058    BMET    Component Value Date/Time   NA 135 01/05/2020 0602   NA 142  01/13/2018 1213   K 3.7 01/05/2020 0602   CL 99 01/05/2020 0602   CO2 27 01/05/2020 0602   GLUCOSE 95 01/05/2020 0602   BUN 12 01/05/2020 0602  BUN 25 01/13/2018 1213   CREATININE 1.23 01/05/2020 0602   CALCIUM 8.3 (L) 01/05/2020 0602   GFRNONAA 52 (L) 01/05/2020 0602   GFRAA >60 01/05/2020 0602   Lipid Panel  October 2018 total cholesterol 140, HDL 84, LDL 47, triglycerides 50  ASSESSMENT:    1. Paroxysmal atrial fibrillation (HCC)   2. Aortic valve stenosis, nonrheumatic   3. Iron deficiency anemia due to chronic blood loss   4. Essential hypertension   5. Encounter for loop recorder at end of battery life   6. Hypercholesterolemia       PLAN:  In order of problems listed above:  1. PAFib: Remains asymptomatic, well rate controlled. While he had a loop recorder the overall burden was around 2%.  CHADSVasc 5 (age 59, "CVA"  2, HTN).   He has had GI bleeding with Xarelto and Eliquis. Currently not on anticoagulation. 2. Fe defic anemia: Since he has aortic stenosis, it is conceivable that he has arteriovenous malformations, which may not be treatable. Hemoglobin in January 2021 was as low as 6.9, received transfusion, stabilized to 9.0-9.5 at the time of discharge, with normocytic indices. Vitamin B12 level was normal in December 2019 3. AS/AI: He was only symptomatic when he was severely anemic. Still seems to have moderate aortic insufficiency. Anticipate he may progress to severe AS within the next 12 months and discuss the option for TAVR. Antiplatelet therapy may be a bit of a challenge with his tendency to develop GI bleeding. 4. HTN: He has well-established situational hypertension/whitecoat syndrome, at home his blood pressure is usually around 140-155/65-70. Dr. [name] has been managing this. I would suggest increasing the lisinopril to 40 mg and continuing the hydrochlorothiazide 25 mg daily (this will require 2 separate prescriptions). 5. HLP: On statin, plan to recheck  labs today since he is fasting. 6. ILR: Device has reached end of service. He does not particularly want it explanted.  Medication Adjustments/Labs and Tests Ordered: Current medicines are reviewed at length with the patient today.  Concerns regarding medicines are outlined above.  Medication changes, Labs and Tests ordered today are listed in the Patient Instructions below. Patient Instructions  Medication Instructions:  No changes *If you need a refill on your cardiac medications before your next appointment, please call your pharmacy*   Lab Work: None ordered If you have labs (blood work) drawn today and your tests are completely normal, you will receive your results only by: Marland Kitchen MyChart Message (if you have MyChart) OR . A paper copy in the mail If you have any lab test that is abnormal or we need to change your treatment, we will call you to review the results.   Testing/Procedures: Your physician has requested that you have an echocardiogram in 12 months. Echocardiography is a painless test that uses sound waves to create images of your heart. It provides your doctor with information about the size and shape of your heart and how well your heart's chambers and valves are working. You may receive an ultrasound enhancing agent through an IV if needed to better visualize your heart during the echo.This procedure takes approximately one hour. There are no restrictions for this procedure. This will take place at the [address], [address].     Follow-Up: At [facility], you and your health needs are our priority.  As part of our continuing mission to provide you with exceptional heart care, we have created designated Provider Care Teams.  These Care Teams include  your primary Cardiologist (physician) and Advanced Practice Providers (APPs -  Physician Assistants and Nurse Practitioners) who all work together to provide you with the care you need, when you need it.  We recommend  signing up for the patient portal called "MyChart".  Sign up information is provided on this After Visit Summary.  MyChart is used to connect with patients for Virtual Visits (Telemedicine).  Patients are able to view lab/test results, encounter notes, upcoming appointments, etc.  Non-urgent messages can be sent to your provider as well.   To learn more about what you can do with MyChart, go to [email or web address].    Your next appointment:   12 month(s)  The format for your next appointment:   In Person  Provider:   You may see Sanda Klein, MD or one of the following Advanced Practice Providers on your designated Care Team:    Almyra Deforest, PA-C  Fabian Sharp, [place] or   Roby Lofts, PA-C         Signed, Sanda Klein, MD  03/07/2020 2:28 PM    [facility] [address], [place], [place]  [address] Phone: [phone]; Fax: [phone]

## 2020-03-07 NOTE — Patient Instructions (Signed)
Medication Instructions:  No changes *If you need a refill on your cardiac medications before your next appointment, please call your pharmacy*   Lab Work: None ordered If you have labs (blood work) drawn today and your tests are completely normal, you will receive your results only by: . MyChart Message (if you have MyChart) OR . A paper copy in the mail If you have any lab test that is abnormal or we need to change your treatment, we will call you to review the results.   Testing/Procedures: Your physician has requested that you have an echocardiogram in 12 months. Echocardiography is a painless test that uses sound waves to create images of your heart. It provides your doctor with information about the size and shape of your heart and how well your heart's chambers and valves are working. You may receive an ultrasound enhancing agent through an IV if needed to better visualize your heart during the echo.This procedure takes approximately one hour. There are no restrictions for this procedure. This will take place at the [address], [address].     Follow-Up: At [facility], you and your health needs are our priority.  As part of our continuing mission to provide you with exceptional heart care, we have created designated Provider Care Teams.  These Care Teams include your primary Cardiologist (physician) and Advanced Practice Providers (APPs -  Physician Assistants and Nurse Practitioners) who all work together to provide you with the care you need, when you need it.  We recommend signing up for the patient portal called "MyChart".  Sign up information is provided on this After Visit Summary.  MyChart is used to connect with patients for Virtual Visits (Telemedicine).  Patients are able to view lab/test results, encounter notes, upcoming appointments, etc.  Non-urgent messages can be sent to your provider as well.   To learn more about what you can do with MyChart, go to  [email or web address].    Your next appointment:   12 month(s)  The format for your next appointment:   In Person  Provider:   You may see Mihai Croitoru, MD or one of the following Advanced Practice Providers on your designated Care Team:    Hao Meng, PA-C  Angela Duke, PA-C or   Krista Kroeger, PA-C   

## 2020-04-11 DIAGNOSIS — C61 Malignant neoplasm of prostate: Secondary | ICD-10-CM | POA: Diagnosis not present

## 2020-04-18 DIAGNOSIS — C61 Malignant neoplasm of prostate: Secondary | ICD-10-CM | POA: Diagnosis not present

## 2020-05-04 DIAGNOSIS — Z85820 Personal history of malignant melanoma of skin: Secondary | ICD-10-CM | POA: Diagnosis not present

## 2020-05-04 DIAGNOSIS — L570 Actinic keratosis: Secondary | ICD-10-CM | POA: Diagnosis not present

## 2020-05-04 DIAGNOSIS — L821 Other seborrheic keratosis: Secondary | ICD-10-CM | POA: Diagnosis not present

## 2020-06-28 DIAGNOSIS — E7800 Pure hypercholesterolemia, unspecified: Secondary | ICD-10-CM | POA: Diagnosis not present

## 2020-06-28 DIAGNOSIS — M150 Primary generalized (osteo)arthritis: Secondary | ICD-10-CM | POA: Diagnosis not present

## 2020-06-28 DIAGNOSIS — I10 Essential (primary) hypertension: Secondary | ICD-10-CM | POA: Diagnosis not present

## 2020-06-28 DIAGNOSIS — E039 Hypothyroidism, unspecified: Secondary | ICD-10-CM | POA: Diagnosis not present

## 2020-08-17 ENCOUNTER — Telehealth: Payer: Self-pay | Admitting: Cardiovascular Disease

## 2020-08-17 NOTE — Telephone Encounter (Signed)
Pt c/o Syncope: STAT if syncope occurred within 30 minutes and pt complains of lightheadedness High Priority if episode of passing out, completely, today or in last 24 hours   1. Did you pass out today? no  2. When is the last time you passed out?  07/25/20  3. Has this occurred multiple times? no  4. Did you have any symptoms prior to passing out? No - patient states he woke up sweating.    Patient had an echo done on 02/29/20 then a f/u appt with Dr. Sallyanne Kuster on 03/07/20. The results showed mild aortic stenosis and Dr. Vivia Ewing advised him to call if he had any of the three following, SOB, Chest Tightness or Dizziness. Patient states he had not had these symptoms until he passed out on 07/25/20 then its like every so often he gets all 3 of these symptoms on and off. He states his BP and HR are fine, this morning it was 133/58 HR 79. He states he is more lethargic throughout the day which is very unlike him because he is pretty active. He states he wakes up just as tired as he is when he goes to bed. Please advise.

## 2020-08-17 NOTE — Telephone Encounter (Signed)
   Pt's wife returning call to follow up

## 2020-08-17 NOTE — Telephone Encounter (Signed)
Busy will try later .Adonis Housekeeper

## 2020-08-18 NOTE — Telephone Encounter (Signed)
Spoke with the patient who states that on 08/16 he was in the bathroom shaving and became dizzy. He walked into the hallway and fell on the floor. His wife was right by him and helped him up. He denies any injuries from the fall and states that he was not out for very long. He has not had any further episodes. He states that he thinks his BP may have been low. He spoke with his PCP who recent discontinued his lisinopril. He states that recently he has been a lot more fatigued. He reports some SOB with exertion. Dizziness at times but has not felt like he is going to pass out. He states BP in the AM has been around 136/55 with HR 70s and BP decreases throughout the day.  Patient is scheduled to see Fabian Sharp, PA-C on 9/23. He is aware to call back if he has any further episodes of passing out and/or new/worsening symptoms.

## 2020-08-19 NOTE — Telephone Encounter (Signed)
Returned the call to the patient. He stated that his PCP stopped the Lisinopril-HCTZ 2 weeks ago due to dizziness. He stated that he is still dizzy and has been lying in the chair most of the day due to this.  Blood pressure this morning: 125/54 HR 85

## 2020-08-19 NOTE — Telephone Encounter (Signed)
Returned the call to the patient. He stated that he feels like the shortness of breath is increasing with exertion. His appointment has been moved up to 9/14. He has been advised to go to the ED if he experiences worsening symptoms such chest tightness or increased shorntess of breath. He may also call the on call provider.

## 2020-08-19 NOTE — Telephone Encounter (Signed)
With those vital signs he should not be dizzy. None of his other medications have direct impact on blood pressure. The aortic stenosis should not cause any symptoms at rest or even with light activity. It is possible he has a neurological or vestibular cause for his dizziness.

## 2020-08-19 NOTE — Telephone Encounter (Signed)
I agree it sounds like low BP event. Lattie Haw, please stop the lisinopril-HCTz and place on lisinopril 20 mg daily. Please have him come in for labs: BMET and CBC (does not need to be fasting) now, before his appointment.  Angie, you see him on 9/23. He never had brady events on implantable loop recorder for 3 years (device battery reached end of service in 2020, implanted for a retinal infarct and did show low burden of AFib, so on DOAC). Had echo in March with moderate AS, no progression from previous echo.   Thank you

## 2020-08-20 ENCOUNTER — Encounter (HOSPITAL_COMMUNITY): Payer: Self-pay

## 2020-08-20 ENCOUNTER — Inpatient Hospital Stay (HOSPITAL_COMMUNITY)
Admission: EM | Admit: 2020-08-20 | Discharge: 2020-08-23 | DRG: 375 | Disposition: A | Discharge status: Home / Self Care | Payer: Medicare Other | Attending: Internal Medicine | Admitting: Internal Medicine

## 2020-08-20 ENCOUNTER — Other Ambulatory Visit: Payer: Self-pay

## 2020-08-20 DIAGNOSIS — D509 Iron deficiency anemia, unspecified: Secondary | ICD-10-CM | POA: Diagnosis present

## 2020-08-20 DIAGNOSIS — D649 Anemia, unspecified: Secondary | ICD-10-CM | POA: Diagnosis not present

## 2020-08-20 DIAGNOSIS — Z823 Family history of stroke: Secondary | ICD-10-CM

## 2020-08-20 DIAGNOSIS — I700 Atherosclerosis of aorta: Secondary | ICD-10-CM | POA: Diagnosis not present

## 2020-08-20 DIAGNOSIS — D7589 Other specified diseases of blood and blood-forming organs: Secondary | ICD-10-CM | POA: Diagnosis not present

## 2020-08-20 DIAGNOSIS — C169 Malignant neoplasm of stomach, unspecified: Secondary | ICD-10-CM | POA: Diagnosis not present

## 2020-08-20 DIAGNOSIS — K922 Gastrointestinal hemorrhage, unspecified: Secondary | ICD-10-CM

## 2020-08-20 DIAGNOSIS — D62 Acute posthemorrhagic anemia: Secondary | ICD-10-CM | POA: Diagnosis present

## 2020-08-20 DIAGNOSIS — R42 Dizziness and giddiness: Secondary | ICD-10-CM | POA: Diagnosis not present

## 2020-08-20 DIAGNOSIS — I5032 Chronic diastolic (congestive) heart failure: Secondary | ICD-10-CM | POA: Diagnosis not present

## 2020-08-20 DIAGNOSIS — Z87891 Personal history of nicotine dependence: Secondary | ICD-10-CM | POA: Diagnosis not present

## 2020-08-20 DIAGNOSIS — E785 Hyperlipidemia, unspecified: Secondary | ICD-10-CM | POA: Diagnosis present

## 2020-08-20 DIAGNOSIS — Z8249 Family history of ischemic heart disease and other diseases of the circulatory system: Secondary | ICD-10-CM

## 2020-08-20 DIAGNOSIS — R0602 Shortness of breath: Secondary | ICD-10-CM | POA: Diagnosis not present

## 2020-08-20 DIAGNOSIS — Z79890 Hormone replacement therapy (postmenopausal): Secondary | ICD-10-CM

## 2020-08-20 DIAGNOSIS — T18128A Food in esophagus causing other injury, initial encounter: Secondary | ICD-10-CM | POA: Diagnosis not present

## 2020-08-20 DIAGNOSIS — K921 Melena: Secondary | ICD-10-CM | POA: Diagnosis not present

## 2020-08-20 DIAGNOSIS — Z20822 Contact with and (suspected) exposure to covid-19: Secondary | ICD-10-CM | POA: Diagnosis present

## 2020-08-20 DIAGNOSIS — K3189 Other diseases of stomach and duodenum: Secondary | ICD-10-CM | POA: Diagnosis present

## 2020-08-20 DIAGNOSIS — D490 Neoplasm of unspecified behavior of digestive system: Secondary | ICD-10-CM | POA: Diagnosis not present

## 2020-08-20 DIAGNOSIS — K319 Disease of stomach and duodenum, unspecified: Secondary | ICD-10-CM | POA: Diagnosis not present

## 2020-08-20 DIAGNOSIS — N1831 Chronic kidney disease, stage 3a: Secondary | ICD-10-CM | POA: Diagnosis not present

## 2020-08-20 DIAGNOSIS — E039 Hypothyroidism, unspecified: Secondary | ICD-10-CM | POA: Diagnosis present

## 2020-08-20 DIAGNOSIS — K5730 Diverticulosis of large intestine without perforation or abscess without bleeding: Secondary | ICD-10-CM | POA: Diagnosis present

## 2020-08-20 DIAGNOSIS — S0990XA Unspecified injury of head, initial encounter: Secondary | ICD-10-CM

## 2020-08-20 DIAGNOSIS — I2510 Atherosclerotic heart disease of native coronary artery without angina pectoris: Secondary | ICD-10-CM | POA: Diagnosis not present

## 2020-08-20 DIAGNOSIS — Z7982 Long term (current) use of aspirin: Secondary | ICD-10-CM | POA: Diagnosis not present

## 2020-08-20 DIAGNOSIS — Z79899 Other long term (current) drug therapy: Secondary | ICD-10-CM

## 2020-08-20 DIAGNOSIS — R0902 Hypoxemia: Secondary | ICD-10-CM | POA: Diagnosis not present

## 2020-08-20 DIAGNOSIS — I130 Hypertensive heart and chronic kidney disease with heart failure and stage 1 through stage 4 chronic kidney disease, or unspecified chronic kidney disease: Secondary | ICD-10-CM | POA: Diagnosis not present

## 2020-08-20 DIAGNOSIS — M542 Cervicalgia: Secondary | ICD-10-CM | POA: Diagnosis not present

## 2020-08-20 DIAGNOSIS — R52 Pain, unspecified: Secondary | ICD-10-CM | POA: Diagnosis not present

## 2020-08-20 DIAGNOSIS — I480 Paroxysmal atrial fibrillation: Secondary | ICD-10-CM | POA: Diagnosis not present

## 2020-08-20 DIAGNOSIS — J439 Emphysema, unspecified: Secondary | ICD-10-CM | POA: Diagnosis not present

## 2020-08-20 DIAGNOSIS — K222 Esophageal obstruction: Secondary | ICD-10-CM | POA: Diagnosis not present

## 2020-08-20 DIAGNOSIS — G629 Polyneuropathy, unspecified: Secondary | ICD-10-CM | POA: Diagnosis not present

## 2020-08-20 DIAGNOSIS — I712 Thoracic aortic aneurysm, without rupture: Secondary | ICD-10-CM | POA: Diagnosis not present

## 2020-08-20 LAB — BASIC METABOLIC PANEL
Anion gap: 11 (ref 5–15)
BUN: 38 mg/dL — ABNORMAL HIGH (ref 8–23)
CO2: 19 mmol/L — ABNORMAL LOW (ref 22–32)
Calcium: 8.6 mg/dL — ABNORMAL LOW (ref 8.9–10.3)
Chloride: 108 mmol/L (ref 98–111)
Creatinine, Ser: 1.31 mg/dL — ABNORMAL HIGH (ref 0.61–1.24)
GFR calc Af Amer: 56 mL/min — ABNORMAL LOW (ref >60)
GFR calc non Af Amer: 48 mL/min — ABNORMAL LOW (ref >60)
Glucose, Bld: 104 mg/dL — ABNORMAL HIGH (ref 70–99)
Potassium: 3.9 mmol/L (ref 3.5–5.1)
Sodium: 138 mmol/L (ref 135–145)

## 2020-08-20 LAB — CBC
HCT: 18.2 % — ABNORMAL LOW (ref 39.0–52.0)
Hemoglobin: 5.3 g/dL — CL (ref 13.0–17.0)
MCH: 30.5 pg (ref 26.0–34.0)
MCHC: 29.1 g/dL — ABNORMAL LOW (ref 30.0–36.0)
MCV: 104.6 fL — ABNORMAL HIGH (ref 80.0–100.0)
Platelets: 263 10*3/uL (ref 150–400)
RBC: 1.74 MIL/uL — ABNORMAL LOW (ref 4.22–5.81)
RDW: 14.1 % (ref 11.5–15.5)
WBC: 13.3 10*3/uL — ABNORMAL HIGH (ref 4.0–10.5)
nRBC: 0 % (ref 0.0–0.2)

## 2020-08-20 NOTE — ED Triage Notes (Signed)
Patient arrived by [facility] for syncopal episode this am and states has been feeling dizzy for 1 week. Patient alert and oriented. States that he knows its all related to his ongoing aorta problems. Patient alert and oriented, denies CP.

## 2020-08-20 NOTE — ED Notes (Signed)
Margie Caba ( pt wife) called to get an update on pt. Please call  [phone]

## 2020-08-21 ENCOUNTER — Emergency Department (HOSPITAL_COMMUNITY): Payer: Medicare Other

## 2020-08-21 ENCOUNTER — Inpatient Hospital Stay (HOSPITAL_COMMUNITY): Payer: Medicare Other | Admitting: Anesthesiology

## 2020-08-21 ENCOUNTER — Other Ambulatory Visit: Payer: Self-pay

## 2020-08-21 ENCOUNTER — Inpatient Hospital Stay (HOSPITAL_COMMUNITY): Payer: Medicare Other

## 2020-08-21 ENCOUNTER — Encounter (HOSPITAL_COMMUNITY)
Admission: EM | Disposition: A | Discharge status: Home / Self Care | Payer: Self-pay | Source: Home / Self Care | Attending: Internal Medicine

## 2020-08-21 ENCOUNTER — Encounter (HOSPITAL_COMMUNITY): Payer: Self-pay | Admitting: Internal Medicine

## 2020-08-21 DIAGNOSIS — I5032 Chronic diastolic (congestive) heart failure: Secondary | ICD-10-CM | POA: Diagnosis present

## 2020-08-21 DIAGNOSIS — Z823 Family history of stroke: Secondary | ICD-10-CM | POA: Diagnosis not present

## 2020-08-21 DIAGNOSIS — Z20822 Contact with and (suspected) exposure to covid-19: Secondary | ICD-10-CM | POA: Diagnosis present

## 2020-08-21 DIAGNOSIS — I2510 Atherosclerotic heart disease of native coronary artery without angina pectoris: Secondary | ICD-10-CM | POA: Diagnosis present

## 2020-08-21 DIAGNOSIS — E039 Hypothyroidism, unspecified: Secondary | ICD-10-CM | POA: Diagnosis present

## 2020-08-21 DIAGNOSIS — G629 Polyneuropathy, unspecified: Secondary | ICD-10-CM | POA: Diagnosis present

## 2020-08-21 DIAGNOSIS — D509 Iron deficiency anemia, unspecified: Secondary | ICD-10-CM | POA: Diagnosis present

## 2020-08-21 DIAGNOSIS — I130 Hypertensive heart and chronic kidney disease with heart failure and stage 1 through stage 4 chronic kidney disease, or unspecified chronic kidney disease: Secondary | ICD-10-CM | POA: Diagnosis present

## 2020-08-21 DIAGNOSIS — C169 Malignant neoplasm of stomach, unspecified: Secondary | ICD-10-CM | POA: Diagnosis present

## 2020-08-21 DIAGNOSIS — D62 Acute posthemorrhagic anemia: Secondary | ICD-10-CM | POA: Diagnosis present

## 2020-08-21 DIAGNOSIS — N1831 Chronic kidney disease, stage 3a: Secondary | ICD-10-CM | POA: Diagnosis present

## 2020-08-21 DIAGNOSIS — K921 Melena: Secondary | ICD-10-CM | POA: Diagnosis present

## 2020-08-21 DIAGNOSIS — Z79890 Hormone replacement therapy (postmenopausal): Secondary | ICD-10-CM | POA: Diagnosis not present

## 2020-08-21 DIAGNOSIS — Z7982 Long term (current) use of aspirin: Secondary | ICD-10-CM | POA: Diagnosis not present

## 2020-08-21 DIAGNOSIS — K5730 Diverticulosis of large intestine without perforation or abscess without bleeding: Secondary | ICD-10-CM | POA: Diagnosis present

## 2020-08-21 DIAGNOSIS — D649 Anemia, unspecified: Secondary | ICD-10-CM | POA: Diagnosis not present

## 2020-08-21 DIAGNOSIS — D7589 Other specified diseases of blood and blood-forming organs: Secondary | ICD-10-CM | POA: Diagnosis present

## 2020-08-21 DIAGNOSIS — I480 Paroxysmal atrial fibrillation: Secondary | ICD-10-CM | POA: Diagnosis present

## 2020-08-21 DIAGNOSIS — K3189 Other diseases of stomach and duodenum: Secondary | ICD-10-CM | POA: Diagnosis present

## 2020-08-21 DIAGNOSIS — Z8249 Family history of ischemic heart disease and other diseases of the circulatory system: Secondary | ICD-10-CM | POA: Diagnosis not present

## 2020-08-21 DIAGNOSIS — Z79899 Other long term (current) drug therapy: Secondary | ICD-10-CM | POA: Diagnosis not present

## 2020-08-21 DIAGNOSIS — Z87891 Personal history of nicotine dependence: Secondary | ICD-10-CM | POA: Diagnosis not present

## 2020-08-21 DIAGNOSIS — E785 Hyperlipidemia, unspecified: Secondary | ICD-10-CM | POA: Diagnosis present

## 2020-08-21 HISTORY — PX: BIOPSY: SHX5522

## 2020-08-21 HISTORY — PX: ESOPHAGOGASTRODUODENOSCOPY (EGD) WITH PROPOFOL: SHX5813

## 2020-08-21 LAB — CBG MONITORING, ED: Glucose-Capillary: 108 mg/dL — ABNORMAL HIGH (ref 70–99)

## 2020-08-21 LAB — COMPREHENSIVE METABOLIC PANEL
ALT: 13 U/L (ref 0–44)
AST: 25 U/L (ref 15–41)
Albumin: 2.7 g/dL — ABNORMAL LOW (ref 3.5–5.0)
Alkaline Phosphatase: 43 U/L (ref 38–126)
Anion gap: 11 (ref 5–15)
BUN: 31 mg/dL — ABNORMAL HIGH (ref 8–23)
CO2: 21 mmol/L — ABNORMAL LOW (ref 22–32)
Calcium: 8.6 mg/dL — ABNORMAL LOW (ref 8.9–10.3)
Chloride: 108 mmol/L (ref 98–111)
Creatinine, Ser: 1.14 mg/dL (ref 0.61–1.24)
GFR calc Af Amer: >60 mL/min (ref >60)
GFR calc non Af Amer: 57 mL/min — ABNORMAL LOW (ref >60)
Glucose, Bld: 106 mg/dL — ABNORMAL HIGH (ref 70–99)
Potassium: 3.8 mmol/L (ref 3.5–5.1)
Sodium: 140 mmol/L (ref 135–145)
Total Bilirubin: 1.1 mg/dL (ref 0.3–1.2)
Total Protein: 5 g/dL — ABNORMAL LOW (ref 6.5–8.1)

## 2020-08-21 LAB — CBC WITH DIFFERENTIAL/PLATELET
Abs Immature Granulocytes: 0.09 10*3/uL — ABNORMAL HIGH (ref 0.00–0.07)
Basophils Absolute: 0.1 10*3/uL (ref 0.0–0.1)
Basophils Relative: 1 %
Eosinophils Absolute: 0.1 10*3/uL (ref 0.0–0.5)
Eosinophils Relative: 0 %
HCT: 24.4 % — ABNORMAL LOW (ref 39.0–52.0)
Hemoglobin: 7.6 g/dL — ABNORMAL LOW (ref 13.0–17.0)
Immature Granulocytes: 1 %
Lymphocytes Relative: 9 %
Lymphs Abs: 1 10*3/uL (ref 0.7–4.0)
MCH: 30.3 pg (ref 26.0–34.0)
MCHC: 31.1 g/dL (ref 30.0–36.0)
MCV: 97.2 fL (ref 80.0–100.0)
Monocytes Absolute: 1 10*3/uL (ref 0.1–1.0)
Monocytes Relative: 8 %
Neutro Abs: 9.3 10*3/uL — ABNORMAL HIGH (ref 1.7–7.7)
Neutrophils Relative %: 81 %
Platelets: 232 10*3/uL (ref 150–400)
RBC: 2.51 MIL/uL — ABNORMAL LOW (ref 4.22–5.81)
RDW: 16.3 % — ABNORMAL HIGH (ref 11.5–15.5)
WBC: 11.5 10*3/uL — ABNORMAL HIGH (ref 4.0–10.5)
nRBC: 0 % (ref 0.0–0.2)

## 2020-08-21 LAB — SARS CORONAVIRUS 2 BY RT PCR (HOSPITAL ORDER, PERFORMED IN ~~LOC~~ HOSPITAL LAB): SARS Coronavirus 2: NEGATIVE

## 2020-08-21 LAB — PREPARE RBC (CROSSMATCH)

## 2020-08-21 LAB — POC OCCULT BLOOD, ED: Fecal Occult Bld: POSITIVE — AB

## 2020-08-21 SURGERY — ESOPHAGOGASTRODUODENOSCOPY (EGD) WITH PROPOFOL
Anesthesia: Monitor Anesthesia Care

## 2020-08-21 MED ORDER — SODIUM CHLORIDE 0.9 % IV SOLN: INTRAVENOUS | Status: DC

## 2020-08-21 MED ORDER — EPINEPHRINE 0.3 MG/0.3ML IJ SOAJ: 0.3000 mg | Freq: Once | INTRAMUSCULAR | Status: DC

## 2020-08-21 MED ORDER — EZETIMIBE-SIMVASTATIN 10-20 MG PO TABS
1.0000 | ORAL_TABLET | Freq: Every day | ORAL | Status: DC
  Filled 2020-08-21: qty 1

## 2020-08-21 MED ORDER — LACTATED RINGERS IV SOLN: INTRAVENOUS | Status: DC | PRN

## 2020-08-21 MED ORDER — GLUCOSAMINE-CHONDROITIN PO CAPS: ORAL_CAPSULE | Freq: Two times a day (BID) | ORAL | Status: DC

## 2020-08-21 MED ORDER — VITAMIN B-12 1000 MCG PO TABS
1000.0000 ug | ORAL_TABLET | Freq: Every day | ORAL | Status: DC
  Administered 2020-08-21 – 2020-08-23 (×3): 1000 ug via ORAL
  Filled 2020-08-21 (×3): qty 1

## 2020-08-21 MED ORDER — LEVOTHYROXINE SODIUM 100 MCG PO TABS
100.0000 ug | ORAL_TABLET | Freq: Every day | ORAL | Status: DC
  Administered 2020-08-21 – 2020-08-23 (×3): 100 ug via ORAL
  Filled 2020-08-21 (×3): qty 1

## 2020-08-21 MED ORDER — EZETIMIBE 10 MG PO TABS
10.0000 mg | ORAL_TABLET | Freq: Every day | ORAL | Status: DC
  Administered 2020-08-22: 10 mg via ORAL
  Filled 2020-08-21 (×2): qty 1

## 2020-08-21 MED ORDER — IOHEXOL 300 MG/ML  SOLN
100.0000 mL | Freq: Once | INTRAMUSCULAR | Status: AC | PRN
  Administered 2020-08-21: 140 mL via INTRAVENOUS

## 2020-08-21 MED ORDER — ALBUTEROL SULFATE (2.5 MG/3ML) 0.083% IN NEBU: 2.5000 mg | INHALATION_SOLUTION | Freq: Every day | RESPIRATORY_TRACT | Status: DC | PRN

## 2020-08-21 MED ORDER — SODIUM CHLORIDE 0.9 % IV SOLN
10.0000 mL/h | Freq: Once | INTRAVENOUS | Status: AC
  Administered 2020-08-21: 10 mL/h via INTRAVENOUS

## 2020-08-21 MED ORDER — PANTOPRAZOLE SODIUM 40 MG IV SOLR
40.0000 mg | Freq: Two times a day (BID) | INTRAVENOUS | Status: DC
  Administered 2020-08-21 – 2020-08-23 (×5): 40 mg via INTRAVENOUS
  Filled 2020-08-21 (×5): qty 40

## 2020-08-21 MED ORDER — COENZYME Q10 30 MG PO CAPS: 200.0000 mg | ORAL_CAPSULE | Freq: Every day | ORAL | Status: DC

## 2020-08-21 MED ORDER — VITAMIN D 25 MCG (1000 UNIT) PO TABS
1000.0000 [IU] | ORAL_TABLET | Freq: Every day | ORAL | Status: DC
  Administered 2020-08-21 – 2020-08-23 (×3): 1000 [IU] via ORAL
  Filled 2020-08-21 (×3): qty 1

## 2020-08-21 MED ORDER — SIMVASTATIN 20 MG PO TABS
20.0000 mg | ORAL_TABLET | Freq: Every day | ORAL | Status: DC
  Administered 2020-08-22: 20 mg via ORAL
  Filled 2020-08-21: qty 1

## 2020-08-21 MED ORDER — FERROUS SULFATE 325 (65 FE) MG PO TABS
325.0000 mg | ORAL_TABLET | Freq: Every day | ORAL | Status: DC
  Administered 2020-08-22 – 2020-08-23 (×2): 325 mg via ORAL
  Filled 2020-08-21 (×2): qty 1

## 2020-08-21 MED ORDER — LACTATED RINGERS IV SOLN: INTRAVENOUS | Status: DC

## 2020-08-21 MED ORDER — BUDESONIDE 0.25 MG/2ML IN SUSP
0.2500 mg | Freq: Two times a day (BID) | RESPIRATORY_TRACT | Status: DC
  Administered 2020-08-22 – 2020-08-23 (×3): 0.25 mg via RESPIRATORY_TRACT
  Filled 2020-08-21 (×3): qty 2

## 2020-08-21 MED ORDER — GABAPENTIN 300 MG PO CAPS
300.0000 mg | ORAL_CAPSULE | Freq: Two times a day (BID) | ORAL | Status: DC
  Administered 2020-08-21 – 2020-08-23 (×5): 300 mg via ORAL
  Filled 2020-08-21 (×5): qty 1

## 2020-08-21 MED ORDER — PROPOFOL 500 MG/50ML IV EMUL
INTRAVENOUS | Status: DC | PRN
  Administered 2020-08-21: 100 ug/kg/min via INTRAVENOUS

## 2020-08-21 MED ORDER — PROPOFOL 10 MG/ML IV BOLUS
INTRAVENOUS | Status: DC | PRN
  Administered 2020-08-21 (×2): 20 mg via INTRAVENOUS
  Administered 2020-08-21 (×2): 10 mg via INTRAVENOUS

## 2020-08-21 SURGICAL SUPPLY — 15 items

## 2020-08-21 NOTE — Progress Notes (Signed)
PHARMACIST - PHYSICIAN ORDER COMMUNICATION  CONCERNING: P&T Medication Policy on Herbal Medications  DESCRIPTION:  This patient's order for:  Co Enzyme Q10  has been noted.  This product(s) is classified as an "herbal" or natural product. Due to a lack of definitive safety studies or FDA approval, nonstandard manufacturing practices, plus the potential risk of unknown drug-drug interactions while on inpatient medications, the Pharmacy and Therapeutics Committee does not permit the use of "herbal" or natural products of this type within [facility].   ACTION TAKEN: The pharmacy department is unable to verify this order at this time and your patient has been informed of this safety policy. Please reevaluate patient's clinical condition at discharge and address if the herbal or natural product(s) should be resumed at that time.   

## 2020-08-21 NOTE — ED Notes (Signed)
Lattie Haw PA aware of incident

## 2020-08-21 NOTE — H&P (Addendum)
History and Physical  Trenten Watchman DPO:[id number] DOB: 10-26-1930 DOA: 08/20/2020  Referring physician: Dr. Rex Kras PCP: [name], L.Marlou Sa, MD  Outpatient Specialists: Cardiology. Patient coming from: Home.  Chief Complaint: Fatigue, shortness of breath and almost passed out.  HPI: Roy David is a 86 y.o. male with medical history significant for paroxysmal A. fib, not on oral anticoagulation due to history of diverticular bleed, iron deficiency anemia, hypothyroidism, peripheral neuropathy, hyperlipidemia, and CAD who presented to [facility] ED from home via EMS due to worsening fatigue and dyspnea of 2 weeks duration.  He had a near syncopal episode at home on the day of presentation, states he nearly passed out.  His wife called EMS.  Associated with dark stools which he thought was secondary to taking iron supplement daily.  Denies any abdominal pain or vomiting.  Denies any bright red blood per rectum.  In the ED, hemoglobin 5.3 with positive FOBT.  2 unit PRBCs ordered to be transfused.  EDP consulted [facility], Dr. Therisa Doyne, will see in the morning.  [facility] asked admit.  While urinating in the bathroom in the ED, patient felt dizzy and fell.  Denies any loss of consciousness.  CT head unremarkable for any acute intracranial findings but showing small left frontal subcutaneous scalp hematoma.  ED Course: Vital signs stable.  Lab studies from for hemoglobin of 5.3, MCV of 104, WBC 13.3K,  Review of Systems: Review of systems as noted in the HPI. All other systems reviewed and are negative.   Past Medical History:  Diagnosis Date  . Arthritis   . Asthma    followed at [facility], stable on Asmanex  . Diverticulosis   . Dysrhythmia   . H/O seasonal allergies    Dr [name]  . Hypercholesteremia   . Hypertension   . Hypochromic-microcytic anemia   . Microcytic anemia   . Paroxysmal atrial fibrillation ([place]) 03/06/2016   on Xarelto  . Prostate cancer [facility])    12/10- Dr Dahlstedt/Dr Valere Dross  . Thyroid  disease    hypothyroidism   Past Surgical History:  Procedure Laterality Date  . APPENDECTOMY    . COLONOSCOPY N/A 01/05/2020   Procedure: COLONOSCOPY;  Surgeon: Clarene Essex, MD;  Location: [place];  Service: Endoscopy;  Laterality: N/A;  . EP IMPLANTABLE DEVICE N/A 11/29/2015   Procedure: Loop Recorder Insertion;  Surgeon: Sanda Klein, MD;  Location: [place] [facility];  Service: Cardiovascular;  Laterality: N/A;  . HERNIA REPAIR    . RETINAL DETACHMENT SURGERY      Social History:  reports that he quit smoking about 60 years ago. He has never used smokeless tobacco. He reports current alcohol use of about 14.0 standard drinks of alcohol per week. He reports that he does not use drugs.   Allergies  Allergen Reactions  . Bee Venom Nausea Only and Other (See Comments)    Wasp and yellow jackets    Family History  Problem Relation Age of Onset  . Heart attack Mother   . Stroke Father   . Stroke Sister      Prior to Admission medications   Medication Sig Start Date End Date Taking? Authorizing Provider  albuterol (VENTOLIN HFA) 108 (90 Base) MCG/ACT inhaler Inhale 1 puff into the lungs daily as needed for wheezing or shortness of breath.  10/28/19 10/27/20  [provider]  aspirin EC 81 MG tablet Take 81 mg by mouth daily.    [provider]  cholecalciferol (VITAMIN D) 1000 units tablet Take 1,000 Units  by mouth daily.     [provider]  co-enzyme Q-10 30 MG capsule Take 200 mg by mouth daily.    [provider]  doxycycline (MONODOX) 100 MG capsule Take 100 mg by mouth 2 (two) times daily as needed.    [provider]  EPINEPHrine 0.3 mg/0.3 mL IJ SOAJ injection Inject 0.3 mg into the muscle once.    [provider]  ezetimibe-simvastatin (VYTORIN) 10-20 MG per tablet Take 1 tablet by mouth daily.    [provider]  ferrous sulfate 325 (65 FE) MG tablet Take 325 mg by mouth daily with breakfast.     [provider]  gabapentin (NEURONTIN) 300 MG capsule Take 300 mg by mouth 2 (two) times daily.     [provider]  GLUCOSAMINE-CHONDROITIN PO Take 1 tablet by mouth 2 (two) times daily.    [provider]  levothyroxine (SYNTHROID, LEVOTHROID) 100 MCG tablet Take 100 mcg by mouth daily.  10/06/15   [provider]  mometasone (ASMANEX 60 METERED DOSES) 220 MCG/INH inhaler Inhale 1 Inhaler into the lungs daily. 10/18/15   [provider]  predniSONE (DELTASONE) 20 MG tablet Take 20 mg by mouth 2 (two) times daily as needed.  07/28/19   [provider]  triamcinolone cream (KENALOG) 0.1 % Apply 1 application topically 2 (two) times daily as needed (itching).  10/01/19   [provider]  vitamin B-12 (CYANOCOBALAMIN) 1000 MCG tablet Take 1,000 mcg by mouth daily.    [provider]    Physical Exam: BP (!) 113/55 (BP Location: Right Arm)   Pulse 84   Temp 98.2 F (36.8 C) (Oral)   Resp 16   SpO2 99%   . General: 86 y.o. year-old male well developed well nourished in no acute distress.  Alert and oriented x3. . Cardiovascular: Regular rate and rhythm with no rubs or gallops.  No thyromegaly or JVD noted.  No lower extremity edema. 2/4 pulses in all 4 extremities. Marland Kitchen Respiratory: Clear to auscultation with no wheezes or rales. Good inspiratory effort. . Abdomen: Soft nontender nondistended with normal bowel sounds x4 quadrants. . Muskuloskeletal: No cyanosis, clubbing or edema noted bilaterally . Neuro: CN II-XII intact, strength, sensation, reflexes . Skin: No ulcerative lesions noted or rashes . Psychiatry: Judgement and insight appear normal. Mood is appropriate for condition and setting          Labs on Admission:  Basic Metabolic Panel: Recent Labs  Lab 08/20/20 1845  NA 138  K 3.9  CL 108  CO2 19*  GLUCOSE 104*  BUN 38*  CREATININE 1.31*  CALCIUM 8.6*   Liver Function Tests: No results for input(s):  AST, ALT, ALKPHOS, BILITOT, PROT, ALBUMIN in the last 168 hours. No results for input(s): LIPASE, AMYLASE in the last 168 hours. No results for input(s): AMMONIA in the last 168 hours. CBC: Recent Labs  Lab 08/20/20 1845  WBC 13.3*  HGB 5.3*  HCT 18.2*  MCV 104.6*  PLT 263   Cardiac Enzymes: No results for input(s): CKTOTAL, CKMB, CKMBINDEX, TROPONINI in the last 168 hours.  BNP (last 3 results) No results for input(s): BNP in the last 8760 hours.  ProBNP (last 3 results) No results for input(s): PROBNP in the last 8760 hours.  CBG: Recent Labs  Lab 08/21/20 0348  GLUCAP 108*    Radiological Exams on Admission: CT Head Wo Contrast  Result Date: 08/21/2020 CLINICAL DATA:  Dizziness for 1 week with syncopal episode  this morning. EXAM: CT HEAD WITHOUT CONTRAST TECHNIQUE: Contiguous axial images were obtained from the base of the skull through the vertex without intravenous contrast. COMPARISON:  None. FINDINGS: Brain: No evidence of acute infarction, hemorrhage, hydrocephalus, extra-axial collection or mass lesion/mass effect. Diffuse cerebral atrophy. Patchy low-attenuation changes in the white matter consistent with small vessel ischemic changes. Vascular: Intracranial arterial vascular calcifications are present. Skull: The calvarium appears intact. Small left frontal subcutaneous scalp hematoma. Sinuses/Orbits: Mucosal thickening in the paranasal sinuses. No acute air-fluid levels. Mastoid air cells are clear. Previous bilateral ocular banding procedures. Other: Congenital nonunion of the posterior arch of C1. IMPRESSION: 1. No acute intracranial abnormalities. 2. Chronic atrophy and small vessel ischemic changes in the white matter. 3. Small left frontal subcutaneous scalp hematoma. Electronically Signed   By: Lucienne Capers M.D.   On: 08/21/2020 03:54    EKG: I independently viewed the EKG done and my findings are as followed: Normal sinus rhythm, rate of  75.  Assessment/Plan Present on Admission: . Symptomatic anemia  Active Problems:   Symptomatic anemia  Severe symptomatic anemia, possibly diverticular bleed in the setting of iron deficiency anemia Presented with worsening dyspnea, a near syncopal episode, fatigue, dark stools. Hemoglobin 5.3 in the ED with positive FOBT 2 unit PRBCs ordered to be transfused by EDP [facility] has been consulted and will see in the morning. Hold off iron supplement until seen by GI Clear liquid diet until seen by GI. Repeat CBC after transfusion  Dizziness, suspect multifactorial, secondary to severe anemia with hemoglobin of 5.3 versus orthostatic hypotension Obtain orthostatic vital signs once stable Transfuse RBC to maintain hemoglobin greater than 7.0. Fall precautions  Leukocytosis, suspect reactive in the setting of GI bleed Presented with WBC 13.3K Nontoxic-appearing Afebrile Continue to hold off antibiotics for now Repeat CBC with differential in the morning  CKD 3A suspect prerenal in the setting of poor oral intake Baseline creatinine appears to be 1.1 with GFR of 58 Bump in the creatinine 1.3 with GFR of 48 Blood transfusion planned, hold off IV fluids. Monitor urine output Repeat BMP  Paroxysmal A. fib, not on oral anticoagulation due to history of GI bleed Continue to hold off anticoagulation Not on any rate control agents. Monitor on telemetry  Chronic diastolic CHF with grade 1 diastolic dysfunction 2D echo done on 02/29/2020 showing normal LVEF 55 to 60% and grade 1 diastolic dysfunction Euvolemic on exam Resume home cardiac medications Monitor strict I's and O's and daily weight.  Polyneuropathy Resume home gabapentin  Hypothyroidism Resume home levothyroxine  Hyperlipidemia Resume Vytorin  History of diverticulosis and diverticular bleed Colonoscopy done on 01/05/2020 when presented with melena showing diverticulosis   Fall with dizziness Treat underlying  condition Fall precautions      DVT prophylaxis: SCDs.  Not on pharmacological DVT prophylaxis due to severe anemia and suspected GI bleed  Code Status: Full code as stated by the patient himself.  Family Communication: None at bedside.  Disposition Plan: Admit to telemetry medical  Consults called: [facility] consulted by EDP.  Admission status: Inpatient status.   Status is: Inpatient   Dispo:  Patient From: Home  Planned Disposition: Home with Health Care Svc  Expected discharge date: 08/23/20  Medically stable for discharge: No due to ongoing management of severe symptomatic anemia.        Kayleen Memos MD [facility] Pager [phone]  If 7PM-7AM, please contact night-coverage [email or web address] Password [facility]  08/21/2020, 4:18 AM

## 2020-08-21 NOTE — Consult Note (Signed)
[facility] Consult  Referring Provider: [facility] Primary Care Physician:  [name], L.Marlou Sa, MD Primary Gastroenterologist: Dr.Buccini  Reason for Consultation: Anemia, hemoglobin 5.3 on presentation, dark stools, FOBT positive  HPI: Roy David is a 86 y.o. male was brought to the ER with progressively worsening shortness of breath, fatigue, dizziness, near syncopal episode at home.  His wife called EMS and on presentation was found to have a hemoglobin of 5.3. Patient states he has been using oral iron for over a year now and stools are usually dark.  He has not noted any blood in stool.  He states his bowel movements are usually regular, 1 every 1 to 3 days.  He denies any abdominal or rectal pain.  He denies vomiting blood.  Although he has paroxysmal atrial fibrillation he is not on any anticoagulation, however takes aspirin 81 mg at home. Recently for the past 3 weeks he has also been experiencing some difficulty swallowing, solids more than liquids, and finds himself drinking more fluids to push food down. He denies any unintentional weight loss or change in appetite otherwise. He reports having an endoscopy several years ago, does not remember the findings. Last colonoscopy was in 01/05/2020 which showed diverticulosis in sigmoid, descending and splenic flexure with fair prep. He has had tubular adenomas removed in 2002, 2010 and in 2016.   Past Medical History:  Diagnosis Date  . Arthritis   . Asthma    followed at [facility], stable on Asmanex  . Diverticulosis   . Dysrhythmia   . H/O seasonal allergies    Dr [name]  . Hypercholesteremia   . Hypertension   . Hypochromic-microcytic anemia   . Microcytic anemia   . Paroxysmal atrial fibrillation ([place]) 03/06/2016   on Xarelto  . Prostate cancer [facility])    12/10- Dr Dahlstedt/Dr Valere Dross  . Thyroid disease    hypothyroidism    Past Surgical History:  Procedure Laterality Date  . APPENDECTOMY    . COLONOSCOPY  N/A 01/05/2020   Procedure: COLONOSCOPY;  Surgeon: Clarene Essex, MD;  Location: [place];  Service: Endoscopy;  Laterality: N/A;  . EP IMPLANTABLE DEVICE N/A 11/29/2015   Procedure: Loop Recorder Insertion;  Surgeon: Sanda Klein, MD;  Location: [place] [facility];  Service: Cardiovascular;  Laterality: N/A;  . HERNIA REPAIR    . RETINAL DETACHMENT SURGERY      Prior to Admission medications   Medication Sig Start Date End Date Taking? Authorizing Provider  albuterol (VENTOLIN HFA) 108 (90 Base) MCG/ACT inhaler Inhale 1 puff into the lungs daily as needed for wheezing or shortness of breath.  10/28/19 10/27/20  [provider]  aspirin EC 81 MG tablet Take 81 mg by mouth daily.    [provider]  cholecalciferol (VITAMIN D) 1000 units tablet Take 1,000 Units by mouth daily.     [provider]  co-enzyme Q-10 30 MG capsule Take 200 mg by mouth daily.    [provider]  doxycycline (MONODOX) 100 MG capsule Take 100 mg by mouth 2 (two) times daily as needed.    [provider]  EPINEPHrine 0.3 mg/0.3 mL IJ SOAJ injection Inject 0.3 mg into the muscle once.    [provider]  ezetimibe-simvastatin (VYTORIN) 10-20 MG per tablet Take 1 tablet by mouth daily.    [provider]  ferrous sulfate 325 (65 FE) MG tablet Take 325 mg by mouth daily with breakfast.    [provider]  gabapentin (NEURONTIN) 300 MG capsule  Take 300 mg by mouth 2 (two) times daily.     [provider]  GLUCOSAMINE-CHONDROITIN PO Take 1 tablet by mouth 2 (two) times daily.    [provider]  levothyroxine (SYNTHROID, LEVOTHROID) 100 MCG tablet Take 100 mcg by mouth daily.  10/06/15   [provider]  mometasone (ASMANEX 60 METERED DOSES) 220 MCG/INH inhaler Inhale 1 Inhaler into the lungs daily. 10/18/15   [provider]  predniSONE (DELTASONE) 20 MG tablet Take 20 mg by mouth 2 (two) times daily as needed.   07/28/19   [provider]  triamcinolone cream (KENALOG) 0.1 % Apply 1 application topically 2 (two) times daily as needed (itching).  10/01/19   [provider]  vitamin B-12 (CYANOCOBALAMIN) 1000 MCG tablet Take 1,000 mcg by mouth daily.    [provider]    Current Facility-Administered Medications  Medication Dose Route Frequency Provider Last Rate Last Admin  . albuterol (PROVENTIL) (2.5 MG/3ML) 0.083% nebulizer solution 2.5 mg  2.5 mg Inhalation Daily PRN Irene Pap N, DO      . budesonide (PULMICORT) nebulizer solution 0.25 mg  0.25 mg Inhalation BID Irene Pap N, DO      . cholecalciferol (VITAMIN D3) tablet 1,000 Units  1,000 Units Oral Daily Irene Pap N, DO      . ezetimibe (ZETIA) tablet 10 mg  10 mg Oral q1800 Little Ishikawa, MD       And  . simvastatin (ZOCOR) tablet 20 mg  20 mg Oral q1800 Little Ishikawa, MD      . gabapentin (NEURONTIN) capsule 300 mg  300 mg Oral BID Hall, Carole N, DO      . levothyroxine (SYNTHROID) tablet 100 mcg  100 mcg Oral Daily Hall, Carole N, DO      . vitamin B-12 (CYANOCOBALAMIN) tablet 1,000 mcg  1,000 mcg Oral Daily Kayleen Memos, DO       Current Outpatient Medications  Medication Sig Dispense Refill  . albuterol (VENTOLIN HFA) 108 (90 Base) MCG/ACT inhaler Inhale 1 puff into the lungs daily as needed for wheezing or shortness of breath.     Marland Kitchen aspirin EC 81 MG tablet Take 81 mg by mouth daily.    . cholecalciferol (VITAMIN D) 1000 units tablet Take 1,000 Units by mouth daily.     Marland Kitchen co-enzyme Q-10 30 MG capsule Take 200 mg by mouth daily.    Marland Kitchen doxycycline (MONODOX) 100 MG capsule Take 100 mg by mouth 2 (two) times daily as needed.    Marland Kitchen EPINEPHrine 0.3 mg/0.3 mL IJ SOAJ injection Inject 0.3 mg into the muscle once.    . ezetimibe-simvastatin (VYTORIN) 10-20 MG per tablet Take 1 tablet by mouth daily.    . ferrous sulfate 325 (65 FE) MG tablet Take 325 mg by mouth daily with breakfast.    .  gabapentin (NEURONTIN) 300 MG capsule Take 300 mg by mouth 2 (two) times daily.     Marland Kitchen GLUCOSAMINE-CHONDROITIN PO Take 1 tablet by mouth 2 (two) times daily.    Marland Kitchen levothyroxine (SYNTHROID, LEVOTHROID) 100 MCG tablet Take 100 mcg by mouth daily.     . mometasone (ASMANEX 60 METERED DOSES) 220 MCG/INH inhaler Inhale 1 Inhaler into the lungs daily.    . predniSONE (DELTASONE) 20 MG tablet Take 20 mg by mouth 2 (two) times daily as needed.     . triamcinolone cream (KENALOG) 0.1 % Apply 1 application topically 2 (two) times daily as needed (itching).     Marland Kitchen  vitamin B-12 (CYANOCOBALAMIN) 1000 MCG tablet Take 1,000 mcg by mouth daily.      Allergies as of 08/20/2020 - Review Complete 08/20/2020  Allergen Reaction Noted  . Bee venom Nausea Only and Other (See Comments) 11/25/2015    Family History  Problem Relation Age of Onset  . Heart attack Mother   . Stroke Father   . Stroke Sister     Social History   Socioeconomic History  . Marital status: Married    Spouse name: Not on file  . Number of children: Not on file  . Years of education: Not on file  . Highest education level: Not on file  Occupational History  . Occupation: retired  Tobacco Use  . Smoking status: Former Smoker    Quit date: 12/09/1959    Years since quitting: 60.7  . Smokeless tobacco: Never Used  Substance and Sexual Activity  . Alcohol use: Yes    Alcohol/week: 14.0 standard drinks    Types: 7 Shots of liquor, 7 Cans of beer per week  . Drug use: No  . Sexual activity: Not on file  Other Topics Concern  . Not on file  Social History Narrative   Tobacco use cigarettes : Former smoker, Quit in year 1955   Occupation : retired/  married   Social Determinants of Radio broadcast assistant Strain:   . Difficulty of Paying Living Expenses: Not on file  Food Insecurity:   . Worried About Charity fundraiser in the Last Year: Not on file  . Ran Out of Food in the Last Year: Not on file  Transportation  Needs:   . Lack of Transportation (Medical): Not on file  . Lack of Transportation (Non-Medical): Not on file  Physical Activity:   . Days of Exercise per Week: Not on file  . Minutes of Exercise per Session: Not on file  Stress:   . Feeling of Stress : Not on file  Social Connections:   . Frequency of Communication with Friends and Family: Not on file  . Frequency of Social Gatherings with Friends and Family: Not on file  . Attends Religious Services: Not on file  . Active Member of Clubs or Organizations: Not on file  . Attends Archivist Meetings: Not on file  . Marital Status: Not on file  Intimate Partner Violence:   . Fear of Current or Ex-Partner: Not on file  . Emotionally Abused: Not on file  . Physically Abused: Not on file  . Sexually Abused: Not on file    Review of Systems: Positive for: GI: Described in detail in HPI.    Gen:  fatigue, weakness, malaise, denies any fever, chills, rigors, night sweats, anorexia, involuntary weight loss, and sleep disorder CV: presyncope, denies chest pain, angina, palpitations, orthopnea, PND, peripheral edema, and claudication. Resp: Denies dyspnea, cough, sputum, wheezing, coughing up blood. GU : Denies urinary burning, blood in urine, urinary frequency, urinary hesitancy, nocturnal urination, and urinary incontinence. MS: Denies joint pain or swelling.  Denies muscle weakness, cramps, atrophy.  Derm: Denies rash, itching, oral ulcerations, hives, unhealing ulcers.  Psych: Denies depression, anxiety, memory loss, suicidal ideation, hallucinations,  and confusion. Heme: Denies bruising, bleeding, and enlarged lymph nodes. Neuro:  dizziness,  denies any headaches, paresthesias. Endo:  Denies any problems with DM, thyroid, adrenal function.  Physical Exam: Vital signs in last 24 hours: Temp:  [97.9 F (36.6 C)-98.6 F (37 C)] 98.6 F (37 C) (09/12 0821) Pulse Rate:  [  69-84] 76 (09/12 0821) Resp:  [16-20] 16 (09/12  0821) BP: (113-124)/(55-69) 124/58 (09/12 0821) SpO2:  [99 %-100 %] 100 % (09/12 0821)    General:   Alert,  Well-developed, well-nourished, pleasant and cooperative in NAD Head:  Normocephalic and atraumatic. Eyes:  Sclera clear, no icterus.   Mild pallor Ears:  Normal auditory acuity. Nose:  No deformity, discharge,  or lesions. Mouth:  No deformity or lesions.  Oropharynx pink & moist. Neck:  Supple; no masses or thyromegaly. Lungs:  Clear throughout to auscultation.   No wheezes, crackles, or rhonchi. No acute distress. Heart:  Regular rate and rhythm; no murmurs, clicks, rubs,  or gallops. Extremities:  Without clubbing or edema. Neurologic:  Alert and  oriented x4;  grossly normal neurologically. Skin:  Intact without significant lesions or rashes. Psych:  Alert and cooperative. Normal mood and affect. Abdomen:  Soft, nontender and nondistended. No masses, hepatosplenomegaly or hernias noted. Normal bowel sounds, without guarding, and without rebound.         Lab Results: Recent Labs    08/20/20 1845  WBC 13.3*  HGB 5.3*  HCT 18.2*  PLT 263   BMET Recent Labs    08/20/20 1845  NA 138  K 3.9  CL 108  CO2 19*  GLUCOSE 104*  BUN 38*  CREATININE 1.31*  CALCIUM 8.6*   LFT No results for input(s): PROT, ALBUMIN, AST, ALT, ALKPHOS, BILITOT, BILIDIR, IBILI in the last 72 hours. PT/INR No results for input(s): LABPROT, INR in the last 72 hours.  Studies/Results: CT Head Wo Contrast  Result Date: 08/21/2020 CLINICAL DATA:  Dizziness for 1 week with syncopal episode this morning. EXAM: CT HEAD WITHOUT CONTRAST TECHNIQUE: Contiguous axial images were obtained from the base of the skull through the vertex without intravenous contrast. COMPARISON:  None. FINDINGS: Brain: No evidence of acute infarction, hemorrhage, hydrocephalus, extra-axial collection or mass lesion/mass effect. Diffuse cerebral atrophy. Patchy low-attenuation changes in the white matter consistent with  small vessel ischemic changes. Vascular: Intracranial arterial vascular calcifications are present. Skull: The calvarium appears intact. Small left frontal subcutaneous scalp hematoma. Sinuses/Orbits: Mucosal thickening in the paranasal sinuses. No acute air-fluid levels. Mastoid air cells are clear. Previous bilateral ocular banding procedures. Other: Congenital nonunion of the posterior arch of C1. IMPRESSION: 1. No acute intracranial abnormalities. 2. Chronic atrophy and small vessel ischemic changes in the white matter. 3. Small left frontal subcutaneous scalp hematoma. Electronically Signed   By: Lucienne Capers M.D.   On: 08/21/2020 03:54    Impression: Symptomatic anemia, dark stools, FOBT positive, macrocytosis Elevated BUN/creatinine ratio of 38/1.31 with a GFR of 48  CT angio of the abdomen and pelvis from 1/21 showed severe diverticulosis, 2.1 cm cystic mass in the neck of pancreas with a follow-up recommended in 2 years with MRI  CT head on admission showed a small left frontal subcutaneous scalp hematoma and chronic atrophy and small vessel ischemic changes, no acute abnormalities  Plan: Patient has finished 1 unit PRBC transfusion, appears hemodynamically stable(BP 124/58, HR 76/min, saturating 100% on room air). Second unit PRBC transfusion to be started soon. Will start patient on PPI IV twice daily with plans for endoscopy today. The risks and the benefits of the procedure were discussed with the patient in details, he verbalized understanding and consents.   LOS: 0 days   Ronnette Juniper, MD  08/21/2020, 8:26 AM

## 2020-08-21 NOTE — ED Notes (Signed)
Informed by lobby staff patient was found on floor in bathroom. Patient stated he had fall. Small hematoma to forehead and abrasion. Charge RN aware of need for room

## 2020-08-21 NOTE — Brief Op Note (Signed)
08/20/2020 - 08/21/2020  10:41 AM  PATIENT:  Gwyndolyn Saxon [place]field  86 y.o. male  PRE-OPERATIVE DIAGNOSIS:  dark stools, anemia  POST-OPERATIVE DIAGNOSIS:  proximal stomach ulcerated mass  PROCEDURE:  Procedure(s): ESOPHAGOGASTRODUODENOSCOPY (EGD) WITH PROPOFOL (N/A)  SURGEON:  Surgeon(s) and Role:    Ronnette Juniper, MD - Primary  PHYSICIAN ASSISTANT:   ASSISTANTS:Karen Hinson, RN, Jamie Billups, Tech  ANESTHESIA:   MAC  EBL:  Bleeding noted post biopsy  BLOOD ADMINISTERED:none  DRAINS: none   LOCAL MEDICATIONS USED:  NONE  SPECIMEN:  Biopsy / Limited Resection  DISPOSITION OF SPECIMEN:  PATHOLOGY  COUNTS:  YES  TOURNIQUET:  * No tourniquets in log *  DICTATION: .Dragon Dictation  PLAN OF CARE: Admit to inpatient   PATIENT DISPOSITION:  PACU - hemodynamically stable.   Delay start of Pharmacological VTE agent (>24hrs) due to surgical blood loss or risk of bleeding: not applicable

## 2020-08-21 NOTE — Transfer of Care (Signed)
Immediate Anesthesia Transfer of Care Note  Patient: Roy David  Procedure(s) Performed: ESOPHAGOGASTRODUODENOSCOPY (EGD) WITH PROPOFOL (N/A )  Patient Location: Endoscopy Unit  Anesthesia Type:MAC  Level of Consciousness: awake, alert  and oriented  Airway & Oxygen Therapy: Patient Spontanous Breathing  Post-op Assessment: Report given to RN and Post -op Vital signs reviewed and stable  Post vital signs: Reviewed and stable  Last Vitals:  Vitals Value Taken Time  BP    Temp    Pulse    Resp    SpO2      Last Pain:  Vitals:   08/21/20 0957  TempSrc:   PainSc: 0-No pain         Complications: No complications documented.

## 2020-08-21 NOTE — Op Note (Addendum)
EGD showed:  Small amount of retained food in distal esophagus. Malignant appearing stricture noted in distal esophagus, scope could be advanced without dilation. Ulcerated, sessile, large mass noted in proximal gastric body, cardia and fundus suspicious for malignancy, biopsies taken. Oozing noted post biopsy.  Normal antrum, incisura, duodenal bulb and duodenum.   Recommendations: Clear liquid diet. PPI twice daily. CT scan of the chest/abdomen and pelvis with IV contrast. Await pathology result, will likely need surgery and oncology evaluation thereafter.  Spoke with his wife Roy David over the phone and discussed the findings with her in details.

## 2020-08-21 NOTE — ED Provider Notes (Signed)
[facility] EMERGENCY DEPARTMENT Provider Note   CSN: [id number] Arrival date & time: 08/20/20  1636     History No chief complaint on file.   Roy David is a 86 y.o. male.  86 year old male with past medical history below including atrial fibrillation not currently anticoagulated, hypertension, hyperlipidemia, hypothyroidism, GI bleed who presents with syncope and weakness.  Patient reports progressively worsening problems with lightheadedness especially with standing, near syncopal episodes, shortness of breath on exertion.  He had a syncopal episode last month and was taken off of his blood pressure medication as they thought it was due to low blood pressure.  He reports that his symptoms have only worsened since that time.  He has not noticed any changes to his stool, he takes iron so it is always dark.  No bright red blood.  He reports some associated chest pressure when he has the shortness of breath.  He denies any cough/cold symptoms or leg swelling.  He is not on anticoagulation for atrial fibrillation due to history of GI bleeding.  Of note, pt used the bathroom in the waiting room and got lightheaded, fell and hit forehead. No LOC. Denies pain since the fall.   The history is provided by the patient.       Past Medical History:  Diagnosis Date   Arthritis    Asthma    followed at [facility], stable on Asmanex   Diverticulosis    Dysrhythmia    H/O seasonal allergies    Dr [name]   Hypercholesteremia    Hypertension    Hypochromic-microcytic anemia    Microcytic anemia    Paroxysmal atrial fibrillation ([place]) 03/06/2016   on Xarelto   Prostate cancer ([place])    12/10- Dr Dahlstedt/Dr Valere Dross   Thyroid disease    hypothyroidism    Patient Active Problem List   Diagnosis Date Noted   Acute lower GI bleeding 12/31/2019   Iron deficiency anemia due to chronic blood loss 01/18/2019   Status post placement of implantable loop recorder  03/05/2018   Anemia    Hypochromic-microcytic anemia    Symptomatic anemia 01/15/2018   Supratherapeutic INR 01/15/2018   Long term current use of anticoagulant 09/04/2017   Encounter for loop recorder check 07/27/2016   Paroxysmal atrial fibrillation ([place]) 03/06/2016   Central retinal artery occlusion of right eye 11/23/2015   Aortic valve stenosis, nonrheumatic 11/23/2015   Essential hypertension 11/23/2015   Hypercholesterolemia 11/23/2015    Past Surgical History:  Procedure Laterality Date   APPENDECTOMY     COLONOSCOPY N/A 01/05/2020   Procedure: COLONOSCOPY;  Surgeon: Clarene Essex, MD;  Location: [facility];  Service: Endoscopy;  Laterality: N/A;   EP IMPLANTABLE DEVICE N/A 11/29/2015   Procedure: Loop Recorder Insertion;  Surgeon: Sanda Klein, MD;  Location: [facility];  Service: Cardiovascular;  Laterality: N/A;   HERNIA REPAIR     RETINAL DETACHMENT SURGERY         Family History  Problem Relation Age of Onset   Heart attack Mother    Stroke Father    Stroke Sister     Social History   Tobacco Use   Smoking status: Former Smoker    Quit date: 12/09/1959    Years since quitting: 60.7   Smokeless tobacco: Never Used  Substance Use Topics   Alcohol use: Yes    Alcohol/week: 14.0 standard drinks    Types: 7 Shots of liquor, 7 Cans of beer per week   Drug  use: No    Home Medications Prior to Admission medications   Medication Sig Start Date End Date Taking? Authorizing Provider  albuterol (VENTOLIN HFA) 108 (90 Base) MCG/ACT inhaler Inhale 1 puff into the lungs daily as needed for wheezing or shortness of breath.  10/28/19 10/27/20  [provider]  aspirin EC 81 MG tablet Take 81 mg by mouth daily.    [provider]  cholecalciferol (VITAMIN D) 1000 units tablet Take 1,000 Units by mouth daily.     [provider]  co-enzyme Q-10 30 MG capsule Take 200 mg by mouth daily.    [provider]  doxycycline (MONODOX) 100 MG capsule Take 100 mg by mouth 2 (two) times daily as needed.    [provider]  EPINEPHrine 0.3 mg/0.3 mL IJ SOAJ injection Inject 0.3 mg into the muscle once.    [provider]  ezetimibe-simvastatin (VYTORIN) 10-20 MG per tablet Take 1 tablet by mouth daily.    [provider]  ferrous sulfate 325 (65 FE) MG tablet Take 325 mg by mouth daily with breakfast.    [provider]  gabapentin (NEURONTIN) 300 MG capsule Take 300 mg by mouth 2 (two) times daily.     [provider]  GLUCOSAMINE-CHONDROITIN PO Take 1 tablet by mouth 2 (two) times daily.    [provider]  levothyroxine (SYNTHROID, LEVOTHROID) 100 MCG tablet Take 100 mcg by mouth daily.  10/06/15   [provider]  mometasone (ASMANEX 60 METERED DOSES) 220 MCG/INH inhaler Inhale 1 Inhaler into the lungs daily. 10/18/15   [provider]  predniSONE (DELTASONE) 20 MG tablet Take 20 mg by mouth 2 (two) times daily as needed.  07/28/19   [provider]  triamcinolone cream (KENALOG) 0.1 % Apply 1 application topically 2 (two) times daily as needed (itching).  10/01/19   [provider]  vitamin B-12 (CYANOCOBALAMIN) 1000 MCG tablet Take 1,000 mcg by mouth daily.    [provider]    Allergies    Bee venom  Review of Systems   Review of Systems All other systems reviewed and are negative except that which was mentioned in HPI  Physical Exam Updated Vital Signs BP (!) 113/55 (BP Location: Right Arm)    Pulse 84    Temp 98.2 F (36.8 C) (Oral)    Resp 16    SpO2 99%   Physical Exam Vitals and nursing note reviewed. Exam conducted with a chaperone present.  Constitutional:      General: He is not in acute distress.    Appearance: He is well-developed.  HENT:     Head: Normocephalic.     Comments: Ecchymosis and small hematoma L forehead    Mouth/Throat:     Comments: Pale tongue Eyes:      Comments: Pale conjunctivae  Cardiovascular:     Rate and Rhythm: Normal rate and regular rhythm.     Heart sounds: Murmur heard.   Pulmonary:     Effort: Pulmonary effort is normal.     Breath sounds: Normal breath sounds.  Abdominal:     General: Bowel sounds are normal. There is no distension.     Palpations: Abdomen is soft.     Tenderness: There is no abdominal tenderness.  Genitourinary:    Comments: Dark stool on DRE Musculoskeletal:     Cervical back: Neck supple.     Right lower leg: No edema.     Left lower leg: No  edema.  Skin:    General: Skin is warm and dry.  Neurological:     Mental Status: He is alert and oriented to person, place, and time.     Comments: Fluent speech  Psychiatric:        Judgment: Judgment normal.     ED Results / Procedures / Treatments   Labs (all labs ordered are listed, but only abnormal results are displayed) Labs Reviewed  BASIC METABOLIC PANEL - Abnormal; Notable for the following components:      Result Value   CO2 19 (*)    Glucose, Bld 104 (*)    BUN 38 (*)    Creatinine, Ser 1.31 (*)    Calcium 8.6 (*)    GFR calc non Af Amer 48 (*)    GFR calc Af Amer 56 (*)    All other components within normal limits  CBC - Abnormal; Notable for the following components:   WBC 13.3 (*)    RBC 1.74 (*)    Hemoglobin 5.3 (*)    HCT 18.2 (*)    MCV 104.6 (*)    MCHC 29.1 (*)    All other components within normal limits  CBG MONITORING, ED - Abnormal; Notable for the following components:   Glucose-Capillary 108 (*)    All other components within normal limits  POC OCCULT BLOOD, ED - Abnormal; Notable for the following components:   Fecal Occult Bld POSITIVE (*)    All other components within normal limits  SARS CORONAVIRUS 2 BY RT PCR (HOSPITAL ORDER, [place] [facility])  URINALYSIS, ROUTINE W REFLEX MICROSCOPIC  TYPE AND SCREEN  PREPARE RBC (CROSSMATCH)    EKG EKG Interpretation  Date/Time:  Saturday  August 20 2020 18:24:23 EDT Ventricular Rate:  75 PR Interval:  194 QRS Duration: 98 QT Interval:  410 QTC Calculation: 457 R Axis:   82 Text Interpretation: Normal sinus rhythm Nonspecific ST abnormality Abnormal ECG No significant change since last tracing Confirmed by Theotis Burrow [phone]) on 08/21/2020 2:50:23 AM   Radiology CT Head Wo Contrast  Result Date: 08/21/2020 CLINICAL DATA:  Dizziness for 1 week with syncopal episode this morning. EXAM: CT HEAD WITHOUT CONTRAST TECHNIQUE: Contiguous axial images were obtained from the base of the skull through the vertex without intravenous contrast. COMPARISON:  None. FINDINGS: Brain: No evidence of acute infarction, hemorrhage, hydrocephalus, extra-axial collection or mass lesion/mass effect. Diffuse cerebral atrophy. Patchy low-attenuation changes in the white matter consistent with small vessel ischemic changes. Vascular: Intracranial arterial vascular calcifications are present. Skull: The calvarium appears intact. Small left frontal subcutaneous scalp hematoma. Sinuses/Orbits: Mucosal thickening in the paranasal sinuses. No acute air-fluid levels. Mastoid air cells are clear. Previous bilateral ocular banding procedures. Other: Congenital nonunion of the posterior arch of C1. IMPRESSION: 1. No acute intracranial abnormalities. 2. Chronic atrophy and small vessel ischemic changes in the white matter. 3. Small left frontal subcutaneous scalp hematoma. Electronically Signed   By: Lucienne Capers M.D.   On: 08/21/2020 03:54    Procedures .Critical Care Performed by: Sharlett Iles, MD Authorized by: Sharlett Iles, MD   Critical care provider statement:    Critical care time (minutes):  30   Critical care time was exclusive of:  Separately billable procedures and treating other patients   Critical care was necessary to treat or prevent imminent or life-threatening deterioration of the following conditions: GI bleeding.    Critical care was time spent personally by me on the  following activities:  Evaluation of patient's response to treatment, examination of patient, obtaining history from patient or surrogate, ordering and performing treatments and interventions, ordering and review of laboratory studies, ordering and review of radiographic studies, re-evaluation of patient's condition and review of old charts   (including critical care time)  Medications Ordered in ED Medications  0.9 %  sodium chloride infusion (has no administration in time range)    ED Course  I have reviewed the triage vital signs and the nursing notes.  Pertinent labs & imaging results that were available during my care of the patient were reviewed by me and considered in my medical decision making (see chart for details).    MDM Rules/Calculators/A&P                          Reassuring vital signs on exam.  Description suggests near syncopal episodes due to lightheadedness.  Lab work shows creatinine 1.31, BUN elevated at 38, hemoglobin 5.3, WBC 13.3.  Hemoccult positive.  I suspect slow GI bleed resulting in symptomatic anemia.  EKG similar to previous.  Obtained head CT because of a fall in the waiting room which was negative acute.  I have messaged the [facility] gastroenterology team for consultation in the morning.  Consented the patient for blood transfusion ordered 2 units pRBC. Discussed admission w/ [facility], Dr. Nevada Crane.  Final Clinical Impression(s) / ED Diagnoses Final diagnoses:  Symptomatic anemia  Gastrointestinal hemorrhage, unspecified gastrointestinal hemorrhage type  Closed head injury, initial encounter    Rx / DC Orders ED Discharge Orders    None       Ashish Rossetti, Wenda Overland, MD 08/21/20 0422

## 2020-08-21 NOTE — Op Note (Signed)
[facility] Patient Name: Roy David Procedure Date : 08/21/2020 MRN: [id number] Attending MD: Ronnette Juniper , MD Date of Birth: [birth year] CSN: [id number] Age: 86 Admit Type: Emergency Department Procedure:                Upper GI endoscopy Indications:              Iron deficiency anemia, Heme positive                            stool,dysphagia for 3 weeks Providers:                Ronnette Juniper, MD, Elmer Ramp. Tilden Dome, RN, [employer],                            Technician, Clearnce Sorrel, CRNA Referring MD:             [facility] Medicines:                Propofol per Anesthesia Complications:            Moderate bleeding post biopsy Estimated Blood Loss:     Estimated blood loss: Bleeding noted post biopsy. Procedure:                Pre-Anesthesia Assessment:                           - Prior to the procedure, a History and Physical                            was performed, and patient medications and                            allergies were reviewed. The patient's tolerance of                            previous anesthesia was also reviewed. The risks                            and benefits of the procedure and the sedation                            options and risks were discussed with the patient.                            All questions were answered, and informed consent                            was obtained. Prior Anticoagulants: The patient has                            taken no previous anticoagulant or antiplatelet                            agents. ASA Grade Assessment: III - A patient with  severe systemic disease. After reviewing the risks                            and benefits, the patient was deemed in                            satisfactory condition to undergo the procedure.                           After obtaining informed consent, the endoscope was                            passed under direct vision. Throughout the                             procedure, the patient's blood pressure, pulse, and                            oxygen saturations were monitored continuously. The                            GIF-H190 ([id number]) Olympus gastroscope was                            introduced through the mouth, and advanced to the                            second part of duodenum. The upper GI endoscopy was                            accomplished without difficulty. The patient                            tolerated the procedure well. Scope In: Scope Out: Findings:      Small amount of food was found in the lower third of the esophagus.      One malignant-appearing, intrinsic moderate (circumferential scarring or       stenosis; an endoscope may pass) stenosis was found in the lower       esophagus. The stenosis was traversed.      A large, infiltrative, sessile and ulcerated, circumferential mass with       oozing bleeding and stigmata of recent bleeding was found in the       proximal stomach, cardia and fundus. Biopsies were taken with a cold       forceps for histology.      The incisura and gastric antrum were normal.      The examined duodenum was normal. Impression:               - Food in the lower third of the esophagus.                           - Malignant-appearing esophageal stenosis.                           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 Biopsied.                           - Normal incisura and antrum.                           - Normal examined duodenum. Moderate Sedation:      Patient did not receive moderate sedation for this procedure, but       instead received monitored anesthesia care. Recommendation:           - Clear liquid diet.                           - Use Protonix (pantoprazole) 40 mg IV BID.                           - Perform a CT scan (computed tomography) of chest                            with contrast, abdomen with contrast and pelvis                             with contrast today.                           - Await pathology results.                           - Surgical and oncology evaluation. Procedure Code(s):        --- Professional ---                           [phone], Esophagogastroduodenoscopy, flexible,                            transoral; with biopsy, single or multiple Diagnosis Code(s):        --- Professional ---                           [phone], Food in esophagus causing other injury,                            initial encounter                           K22.2, Esophageal obstruction                           D49.0, Neoplasm of unspecified behavior of                            digestive system                           D50.9, Iron deficiency anemia, unspecified                           R19.5, Other fecal  abnormalities CPT copyright 2019 [employer]. All rights reserved. The codes documented in this report are preliminary and upon coder review may  be revised to meet current compliance requirements. Ronnette Juniper, MD 08/21/2020 10:51:15 AM This report has been signed electronically. Number of Addenda: 0

## 2020-08-21 NOTE — Progress Notes (Signed)
Bedside report given to Judson Roch, ED RN.  Pt transported to hall between triage rooms.  Pt wife requested to visit pt in ED when she arrives to the hospital.  ED RN aware of pt/wife request.

## 2020-08-21 NOTE — Progress Notes (Signed)
PROGRESS NOTE    Roy David  WIO:[id number] DOB: January 16, 1930 DOA: 08/20/2020 PCP: [name], L.Marlou Sa, MD   Brief Narrative:  Roy David is a 86 y.o. male with medical history significant for paroxysmal A. fib, not on oral anticoagulation due to history of diverticular bleed, iron deficiency anemia, hypothyroidism, peripheral neuropathy, hyperlipidemia, and CAD who presented to [facility] ED from home via EMS due to worsening fatigue and dyspnea of 2 weeks duration.  He had a near syncopal episode at home on the day of presentation, states he nearly passed out.  His wife called EMS.  Associated with dark stools which he thought was secondary to taking iron supplement daily.  Denies any abdominal pain or vomiting.  Denies any bright red blood per rectum.  In the ED, hemoglobin 5.3 with positive FOBT.  2 unit PRBCs ordered to be transfused.  EDP consulted [facility], Dr. Therisa Doyne, will see in the morning.  [facility] asked admit. While urinating in the bathroom in the ED, patient felt dizzy and fell.  Denies any loss of consciousness.  CT head unremarkable for any acute intracranial findings but showing small left frontal subcutaneous scalp hematoma.   Assessment & Plan:   Active Problems:   Symptomatic anemia   Severe symptomatic anemia, possibly diverticular bleed in the setting of iron deficiency anemia - Presented with worsening dyspnea, a near syncopal episode with fall at admission, fatigue, dark stools - Hemoglobin 5.3 in the ED with positive FOBT - 2 unit PRBCs transfusing in ED this morning -repeat H&H 7.6 this afternoon - [facility] endoscopy revealed gastric mass, biopsies pending -CT chest abdomen pelvis pending to further evaluate mass - Pending biopsy we will likely sideline surgery/oncology for further insight and recommendations. - Diet advancement per GI -currently on clears  Leukocytosis, suspect reactive in the setting of GI bleed, downtrending appropriately - Presented with WBC  13.3K-->11k - Nontoxic-appearing - Afebrile - Continue to hold off antibiotics for now - Repeat CBC with differential in the morning  CKD 3A  with questionable AKI - suspect prerenal in the setting of poor oral intake -Near baseline, continue fluids, transfusion as above -Continue to follow with morning labs  Paroxysmal A. fib, not on oral anticoagulation due to history of GI bleed - Continue to hold off anticoagulation - Not on any rate control agents. - Monitor on telemetry  Chronic diastolic CHF with grade 1 diastolic dysfunction - 2D echo done on 02/29/2020 showing normal LVEF 55 to 60% and grade 1 diastolic dysfunction - Euvolemic on exam - Resume home cardiac medications - Monitor strict I's and O's and daily weight.  Polyneuropathy - Resume home gabapentin  Hypothyroidism - Resume home levothyroxine  Hyperlipidemia - Resume Vytorin  History of diverticulosis and diverticular bleed - Colonoscopy done on 01/05/2020 when presented with melena showing diverticulosis    DVT prophylaxis: SCDs.  Not on pharmacological DVT prophylaxis due to severe anemia and suspected GI bleed Code Status: Full code Family Communication: None present  Status is: Inpatient  Dispo: The patient is from: Home              Anticipated d/c is to: Home              Anticipated d/c date is: 48 to 72 hours pending clinical course              Patient currently not medically stable for discharge due to ongoing anemia, possible need for further endoscopy or evaluation.  Consultants:   GI  Procedures:   Upper endoscopy with biopsy  Antimicrobials:  None indicated  Subjective: No acute issues or events overnight, patient's fatigue weakness and general malaise is improving with blood transfusion.  Otherwise denies chest pain, shortness of breath, nausea, vomiting, diarrhea, constipation, headache, fevers, chills.  Objective: Vitals:   08/20/20 1832 08/20/20 2015 08/20/20 2350  08/21/20 0530  BP: 116/61 121/69 (!) 113/55 120/65  Pulse: 77 69 84 79  Resp: 20 20 16 16   Temp: 98 F (36.7 C) 97.9 F (36.6 C) 98.2 F (36.8 C) 98 F (36.7 C)  TempSrc: Oral Oral Oral Oral  SpO2: 100% 100% 99% 100%   No intake or output data in the 24 hours ending 08/21/20 0758 There were no vitals filed for this visit.  Examination:  General:  Pleasantly resting in bed, No acute distress. HEENT:  Normocephalic atraumatic.  Sclerae nonicteric, noninjected.  Extraocular movements intact bilaterally. Neck:  Without mass or deformity.  Trachea is midline. Lungs:  Clear to auscultate bilaterally without rhonchi, wheeze, or rales. Heart:  Regular rate and rhythm.  Without murmurs, rubs, or gallops. Abdomen:  Soft, nontender, nondistended.  Without guarding or rebound. Extremities: Without cyanosis, clubbing, edema, or obvious deformity. Vascular:  Dorsalis pedis and posterior tibial pulses palpable bilaterally. Skin:  Warm and dry, no erythema, no ulcerations.    Data Reviewed: I have personally reviewed following labs and imaging studies  CBC: Recent Labs  Lab 08/20/20 1845  WBC 13.3*  HGB 5.3*  HCT 18.2*  MCV 104.6*  PLT 308   Basic Metabolic Panel: Recent Labs  Lab 08/20/20 1845  NA 138  K 3.9  CL 108  CO2 19*  GLUCOSE 104*  BUN 38*  CREATININE 1.31*  CALCIUM 8.6*   GFR: CrCl cannot be calculated (Unknown ideal weight.). Liver Function Tests: No results for input(s): AST, ALT, ALKPHOS, BILITOT, PROT, ALBUMIN in the last 168 hours. No results for input(s): LIPASE, AMYLASE in the last 168 hours. No results for input(s): AMMONIA in the last 168 hours. Coagulation Profile: No results for input(s): INR, PROTIME in the last 168 hours. Cardiac Enzymes: No results for input(s): CKTOTAL, CKMB, CKMBINDEX, TROPONINI in the last 168 hours. BNP (last 3 results) No results for input(s): PROBNP in the last 8760 hours. HbA1C: No results for input(s): HGBA1C in the  last 72 hours. CBG: Recent Labs  Lab 08/21/20 0348  GLUCAP 108*   Lipid Profile: No results for input(s): CHOL, HDL, LDLCALC, TRIG, CHOLHDL, LDLDIRECT in the last 72 hours. Thyroid Function Tests: No results for input(s): TSH, T4TOTAL, FREET4, T3FREE, THYROIDAB in the last 72 hours. Anemia Panel: No results for input(s): VITAMINB12, FOLATE, FERRITIN, TIBC, IRON, RETICCTPCT in the last 72 hours. Sepsis Labs: No results for input(s): PROCALCITON, LATICACIDVEN in the last 168 hours.  Recent Results (from the past 240 hour(s))  SARS Coronavirus 2 by RT PCR (hospital order, performed in [facility] hospital lab) Nasopharyngeal Nasopharyngeal Swab     Status: None   Collection Time: 08/21/20  3:40 AM   Specimen: Nasopharyngeal Swab  Result Value Ref Range Status   SARS Coronavirus 2 NEGATIVE NEGATIVE Final    Comment: (NOTE) SARS-CoV-2 target nucleic acids are NOT DETECTED.  The SARS-CoV-2 RNA is generally detectable in upper and lower respiratory specimens during the acute phase of infection. The lowest concentration of SARS-CoV-2 viral copies this assay can detect is 250 copies / mL. A negative result does not preclude SARS-CoV-2 infection and should not be used as the sole basis  for treatment or other patient management decisions.  A negative result may occur with improper specimen collection / handling, submission of specimen other than nasopharyngeal swab, presence of viral mutation(s) within the areas targeted by this assay, and inadequate number of viral copies (<250 copies / mL). A negative result must be combined with clinical observations, patient history, and epidemiological information.  Fact Sheet for Patients:   StrictlyIdeas.no  Fact Sheet for Healthcare Providers: BankingDealers.co.za  This test is not yet approved or  cleared by the [place] FDA and has been authorized for detection and/or diagnosis of  SARS-CoV-2 by FDA under an Emergency Use Authorization (EUA).  This EUA will remain in effect (meaning this test can be used) for the duration of the COVID-19 declaration under Section 564(b)(1) of the Act, 21 U.S.C. section 360bbb-3(b)(1), unless the authorization is terminated or revoked sooner.  Performed at [facility], [place] [address], [place], [place] [address]          Radiology Studies: CT Head Wo Contrast  Result Date: 08/21/2020 CLINICAL DATA:  Dizziness for 1 week with syncopal episode this morning. EXAM: CT HEAD WITHOUT CONTRAST TECHNIQUE: Contiguous axial images were obtained from the base of the skull through the vertex without intravenous contrast. COMPARISON:  None. FINDINGS: Brain: No evidence of acute infarction, hemorrhage, hydrocephalus, extra-axial collection or mass lesion/mass effect. Diffuse cerebral atrophy. Patchy low-attenuation changes in the white matter consistent with small vessel ischemic changes. Vascular: Intracranial arterial vascular calcifications are present. Skull: The calvarium appears intact. Small left frontal subcutaneous scalp hematoma. Sinuses/Orbits: Mucosal thickening in the paranasal sinuses. No acute air-fluid levels. Mastoid air cells are clear. Previous bilateral ocular banding procedures. Other: Congenital nonunion of the posterior arch of C1. IMPRESSION: 1. No acute intracranial abnormalities. 2. Chronic atrophy and small vessel ischemic changes in the white matter. 3. Small left frontal subcutaneous scalp hematoma. Electronically Signed   By: Lucienne Capers M.D.   On: 08/21/2020 03:54   Scheduled Meds: . budesonide  0.25 mg Inhalation BID  . cholecalciferol  1,000 Units Oral Daily  . ezetimibe  10 mg Oral q1800   And  . simvastatin  20 mg Oral q1800  . gabapentin  300 mg Oral BID  . levothyroxine  100 mcg Oral Daily  . vitamin B-12  1,000 mcg Oral Daily    LOS: 0 days   Time spent: 42min  Charly C Keeana Pieratt, DO [facility]  If 7PM-7AM, please contact night-coverage [email or web address]  08/21/2020, 7:58 AM

## 2020-08-21 NOTE — Interval H&P Note (Signed)
History and Physical Interval Note: 89/male with anemia, FOBT positive stools for EGD with possible pillcam deployment.  08/21/2020 10:11 AM  Roy David  has presented today for EGD with possible pillcam deployment, with the diagnosis of dark stools, anemia.  The various methods of treatment have been discussed with the patient and family. After consideration of risks, benefits and other options for treatment, the patient has consented to  Procedure(s): ESOPHAGOGASTRODUODENOSCOPY (EGD) WITH PROPOFOL (N/A) as a surgical intervention.  The patient's history has been reviewed, patient examined, no change in status, stable for surgery.  I have reviewed the patient's chart and labs.  Questions were answered to the patient's satisfaction.     Ronnette Juniper

## 2020-08-21 NOTE — Progress Notes (Signed)
PHARMACIST - PHYSICIAN ORDER COMMUNICATION  CONCERNING: P&T Medication Policy on Herbal Medications  DESCRIPTION:  This patient's order for:  Glucosamine chondroitin  has been noted.  This product(s) is classified as an "herbal" or natural product. Due to a lack of definitive safety studies or FDA approval, nonstandard manufacturing practices, plus the potential risk of unknown drug-drug interactions while on inpatient medications, the Pharmacy and Therapeutics Committee does not permit the use of "herbal" or natural products of this type within [facility].   ACTION TAKEN: The pharmacy department is unable to verify this order at this time and your patient has been informed of this safety policy. Please reevaluate patient's clinical condition at discharge and address if the herbal or natural product(s) should be resumed at that time.  Alexica Schlossberg P. Legrand Como, PharmD, [place] Please utilize Amion for appropriate phone number to reach the unit pharmacist ([place]) 08/21/2020 3:09 PM

## 2020-08-21 NOTE — Anesthesia Preprocedure Evaluation (Signed)
Anesthesia Evaluation  Patient identified by MRN, date of birth, ID band Patient awake    Reviewed: Allergy & Precautions, NPO status , Patient's Chart, lab work & pertinent test results  Airway Mallampati: II  TM Distance: >3 FB Neck ROM: Full    Dental  (+) Dental Advisory Given   Pulmonary asthma , former smoker,    breath sounds clear to auscultation       Cardiovascular hypertension, Pt. on medications + dysrhythmias Atrial Fibrillation  Rhythm:Regular Rate:Normal  02/2020 Echo: Normal EF. Moderate AS (Mean gradient of 14mHg). Moderate AI.   Neuro/Psych negative neurological ROS     GI/Hepatic Neg liver ROS, GI bleed   Endo/Other  negative endocrine ROS  Renal/GU Renal InsufficiencyRenal disease     Musculoskeletal  (+) Arthritis ,   Abdominal   Peds  Hematology  (+) anemia ,   Anesthesia Other Findings   Reproductive/Obstetrics                             Anesthesia Physical Anesthesia Plan  ASA: IV  Anesthesia Plan: MAC   Post-op Pain Management:    Induction:   PONV Risk Score and Plan: 1 and Propofol infusion, Ondansetron and Treatment may vary due to age or medical condition  Airway Management Planned: Natural Airway and Nasal Cannula  Additional Equipment:   Intra-op Plan:   Post-operative Plan:   Informed Consent: I have reviewed the patients History and Physical, chart, labs and discussed the procedure including the risks, benefits and alternatives for the proposed anesthesia with the patient or authorized representative who has indicated his/her understanding and acceptance.       Plan Discussed with:   Anesthesia Plan Comments:         Anesthesia Quick Evaluation

## 2020-08-21 NOTE — H&P (View-Only) (Signed)
[facility] Consult  Referring Provider: [facility] Primary Care Physician:  [name], L.Marlou Sa, MD Primary Gastroenterologist: Dr.Buccini  Reason for Consultation: Anemia, hemoglobin 5.3 on presentation, dark stools, FOBT positive  HPI: Roy David is a 86 y.o. male was brought to the ER with progressively worsening shortness of breath, fatigue, dizziness, near syncopal episode at home.  His wife called EMS and on presentation was found to have a hemoglobin of 5.3. Patient states he has been using oral iron for over a year now and stools are usually dark.  He has not noted any blood in stool.  He states his bowel movements are usually regular, 1 every 1 to 3 days.  He denies any abdominal or rectal pain.  He denies vomiting blood.  Although he has paroxysmal atrial fibrillation he is not on any anticoagulation, however takes aspirin 81 mg at home. Recently for the past 3 weeks he has also been experiencing some difficulty swallowing, solids more than liquids, and finds himself drinking more fluids to push food down. He denies any unintentional weight loss or change in appetite otherwise. He reports having an endoscopy several years ago, does not remember the findings. Last colonoscopy was in 01/05/2020 which showed diverticulosis in sigmoid, descending and splenic flexure with fair prep. He has had tubular adenomas removed in 2002, 2010 and in 2016.   Past Medical History:  Diagnosis Date  . Arthritis   . Asthma    followed at [facility], stable on Asmanex  . Diverticulosis   . Dysrhythmia   . H/O seasonal allergies    Dr [name]  . Hypercholesteremia   . Hypertension   . Hypochromic-microcytic anemia   . Microcytic anemia   . Paroxysmal atrial fibrillation ([place]) 03/06/2016   on Xarelto  . Prostate cancer [facility])    12/10- Dr Dahlstedt/Dr Valere Dross  . Thyroid disease    hypothyroidism    Past Surgical History:  Procedure Laterality Date  . APPENDECTOMY    . COLONOSCOPY  N/A 01/05/2020   Procedure: COLONOSCOPY;  Surgeon: Clarene Essex, MD;  Location: [place];  Service: Endoscopy;  Laterality: N/A;  . EP IMPLANTABLE DEVICE N/A 11/29/2015   Procedure: Loop Recorder Insertion;  Surgeon: Sanda Klein, MD;  Location: [place] [facility];  Service: Cardiovascular;  Laterality: N/A;  . HERNIA REPAIR    . RETINAL DETACHMENT SURGERY      Prior to Admission medications   Medication Sig Start Date End Date Taking? Authorizing Provider  albuterol (VENTOLIN HFA) 108 (90 Base) MCG/ACT inhaler Inhale 1 puff into the lungs daily as needed for wheezing or shortness of breath.  10/28/19 10/27/20  [provider]  aspirin EC 81 MG tablet Take 81 mg by mouth daily.    [provider]  cholecalciferol (VITAMIN D) 1000 units tablet Take 1,000 Units by mouth daily.     [provider]  co-enzyme Q-10 30 MG capsule Take 200 mg by mouth daily.    [provider]  doxycycline (MONODOX) 100 MG capsule Take 100 mg by mouth 2 (two) times daily as needed.    [provider]  EPINEPHrine 0.3 mg/0.3 mL IJ SOAJ injection Inject 0.3 mg into the muscle once.    [provider]  ezetimibe-simvastatin (VYTORIN) 10-20 MG per tablet Take 1 tablet by mouth daily.    [provider]  ferrous sulfate 325 (65 FE) MG tablet Take 325 mg by mouth daily with breakfast.    [provider]  gabapentin (NEURONTIN) 300 MG capsule  Take 300 mg by mouth 2 (two) times daily.     [provider]  GLUCOSAMINE-CHONDROITIN PO Take 1 tablet by mouth 2 (two) times daily.    [provider]  levothyroxine (SYNTHROID, LEVOTHROID) 100 MCG tablet Take 100 mcg by mouth daily.  10/06/15   [provider]  mometasone (ASMANEX 60 METERED DOSES) 220 MCG/INH inhaler Inhale 1 Inhaler into the lungs daily. 10/18/15   [provider]  predniSONE (DELTASONE) 20 MG tablet Take 20 mg by mouth 2 (two) times daily as needed.   07/28/19   [provider]  triamcinolone cream (KENALOG) 0.1 % Apply 1 application topically 2 (two) times daily as needed (itching).  10/01/19   [provider]  vitamin B-12 (CYANOCOBALAMIN) 1000 MCG tablet Take 1,000 mcg by mouth daily.    [provider]    Current Facility-Administered Medications  Medication Dose Route Frequency Provider Last Rate Last Admin  . albuterol (PROVENTIL) (2.5 MG/3ML) 0.083% nebulizer solution 2.5 mg  2.5 mg Inhalation Daily PRN Irene Pap N, DO      . budesonide (PULMICORT) nebulizer solution 0.25 mg  0.25 mg Inhalation BID Irene Pap N, DO      . cholecalciferol (VITAMIN D3) tablet 1,000 Units  1,000 Units Oral Daily Irene Pap N, DO      . ezetimibe (ZETIA) tablet 10 mg  10 mg Oral q1800 Little Ishikawa, MD       And  . simvastatin (ZOCOR) tablet 20 mg  20 mg Oral q1800 Little Ishikawa, MD      . gabapentin (NEURONTIN) capsule 300 mg  300 mg Oral BID Hall, Carole N, DO      . levothyroxine (SYNTHROID) tablet 100 mcg  100 mcg Oral Daily Hall, Carole N, DO      . vitamin B-12 (CYANOCOBALAMIN) tablet 1,000 mcg  1,000 mcg Oral Daily Kayleen Memos, DO       Current Outpatient Medications  Medication Sig Dispense Refill  . albuterol (VENTOLIN HFA) 108 (90 Base) MCG/ACT inhaler Inhale 1 puff into the lungs daily as needed for wheezing or shortness of breath.     Marland Kitchen aspirin EC 81 MG tablet Take 81 mg by mouth daily.    . cholecalciferol (VITAMIN D) 1000 units tablet Take 1,000 Units by mouth daily.     Marland Kitchen co-enzyme Q-10 30 MG capsule Take 200 mg by mouth daily.    Marland Kitchen doxycycline (MONODOX) 100 MG capsule Take 100 mg by mouth 2 (two) times daily as needed.    Marland Kitchen EPINEPHrine 0.3 mg/0.3 mL IJ SOAJ injection Inject 0.3 mg into the muscle once.    . ezetimibe-simvastatin (VYTORIN) 10-20 MG per tablet Take 1 tablet by mouth daily.    . ferrous sulfate 325 (65 FE) MG tablet Take 325 mg by mouth daily with breakfast.    .  gabapentin (NEURONTIN) 300 MG capsule Take 300 mg by mouth 2 (two) times daily.     Marland Kitchen GLUCOSAMINE-CHONDROITIN PO Take 1 tablet by mouth 2 (two) times daily.    Marland Kitchen levothyroxine (SYNTHROID, LEVOTHROID) 100 MCG tablet Take 100 mcg by mouth daily.     . mometasone (ASMANEX 60 METERED DOSES) 220 MCG/INH inhaler Inhale 1 Inhaler into the lungs daily.    . predniSONE (DELTASONE) 20 MG tablet Take 20 mg by mouth 2 (two) times daily as needed.     . triamcinolone cream (KENALOG) 0.1 % Apply 1 application topically 2 (two) times daily as needed (itching).     Marland Kitchen  vitamin B-12 (CYANOCOBALAMIN) 1000 MCG tablet Take 1,000 mcg by mouth daily.      Allergies as of 08/20/2020 - Review Complete 08/20/2020  Allergen Reaction Noted  . Bee venom Nausea Only and Other (See Comments) 11/25/2015    Family History  Problem Relation Age of Onset  . Heart attack Mother   . Stroke Father   . Stroke Sister     Social History   Socioeconomic History  . Marital status: Married    Spouse name: Not on file  . Number of children: Not on file  . Years of education: Not on file  . Highest education level: Not on file  Occupational History  . Occupation: retired  Tobacco Use  . Smoking status: Former Smoker    Quit date: 12/09/1959    Years since quitting: 60.7  . Smokeless tobacco: Never Used  Substance and Sexual Activity  . Alcohol use: Yes    Alcohol/week: 14.0 standard drinks    Types: 7 Shots of liquor, 7 Cans of beer per week  . Drug use: No  . Sexual activity: Not on file  Other Topics Concern  . Not on file  Social History Narrative   Tobacco use cigarettes : Former smoker, Quit in year 1955   Occupation : retired/  married   Social Determinants of Radio broadcast assistant Strain:   . Difficulty of Paying Living Expenses: Not on file  Food Insecurity:   . Worried About Charity fundraiser in the Last Year: Not on file  . Ran Out of Food in the Last Year: Not on file  Transportation  Needs:   . Lack of Transportation (Medical): Not on file  . Lack of Transportation (Non-Medical): Not on file  Physical Activity:   . Days of Exercise per Week: Not on file  . Minutes of Exercise per Session: Not on file  Stress:   . Feeling of Stress : Not on file  Social Connections:   . Frequency of Communication with Friends and Family: Not on file  . Frequency of Social Gatherings with Friends and Family: Not on file  . Attends Religious Services: Not on file  . Active Member of Clubs or Organizations: Not on file  . Attends Archivist Meetings: Not on file  . Marital Status: Not on file  Intimate Partner Violence:   . Fear of Current or Ex-Partner: Not on file  . Emotionally Abused: Not on file  . Physically Abused: Not on file  . Sexually Abused: Not on file    Review of Systems: Positive for: GI: Described in detail in HPI.    Gen:  fatigue, weakness, malaise, denies any fever, chills, rigors, night sweats, anorexia, involuntary weight loss, and sleep disorder CV: presyncope, denies chest pain, angina, palpitations, orthopnea, PND, peripheral edema, and claudication. Resp: Denies dyspnea, cough, sputum, wheezing, coughing up blood. GU : Denies urinary burning, blood in urine, urinary frequency, urinary hesitancy, nocturnal urination, and urinary incontinence. MS: Denies joint pain or swelling.  Denies muscle weakness, cramps, atrophy.  Derm: Denies rash, itching, oral ulcerations, hives, unhealing ulcers.  Psych: Denies depression, anxiety, memory loss, suicidal ideation, hallucinations,  and confusion. Heme: Denies bruising, bleeding, and enlarged lymph nodes. Neuro:  dizziness,  denies any headaches, paresthesias. Endo:  Denies any problems with DM, thyroid, adrenal function.  Physical Exam: Vital signs in last 24 hours: Temp:  [97.9 F (36.6 C)-98.6 F (37 C)] 98.6 F (37 C) (09/12 0821) Pulse Rate:  [  69-84] 76 (09/12 0821) Resp:  [16-20] 16 (09/12  0821) BP: (113-124)/(55-69) 124/58 (09/12 0821) SpO2:  [99 %-100 %] 100 % (09/12 0821)    General:   Alert,  Well-developed, well-nourished, pleasant and cooperative in NAD Head:  Normocephalic and atraumatic. Eyes:  Sclera clear, no icterus.   Mild pallor Ears:  Normal auditory acuity. Nose:  No deformity, discharge,  or lesions. Mouth:  No deformity or lesions.  Oropharynx pink & moist. Neck:  Supple; no masses or thyromegaly. Lungs:  Clear throughout to auscultation.   No wheezes, crackles, or rhonchi. No acute distress. Heart:  Regular rate and rhythm; no murmurs, clicks, rubs,  or gallops. Extremities:  Without clubbing or edema. Neurologic:  Alert and  oriented x4;  grossly normal neurologically. Skin:  Intact without significant lesions or rashes. Psych:  Alert and cooperative. Normal mood and affect. Abdomen:  Soft, nontender and nondistended. No masses, hepatosplenomegaly or hernias noted. Normal bowel sounds, without guarding, and without rebound.         Lab Results: Recent Labs    08/20/20 1845  WBC 13.3*  HGB 5.3*  HCT 18.2*  PLT 263   BMET Recent Labs    08/20/20 1845  NA 138  K 3.9  CL 108  CO2 19*  GLUCOSE 104*  BUN 38*  CREATININE 1.31*  CALCIUM 8.6*   LFT No results for input(s): PROT, ALBUMIN, AST, ALT, ALKPHOS, BILITOT, BILIDIR, IBILI in the last 72 hours. PT/INR No results for input(s): LABPROT, INR in the last 72 hours.  Studies/Results: CT Head Wo Contrast  Result Date: 08/21/2020 CLINICAL DATA:  Dizziness for 1 week with syncopal episode this morning. EXAM: CT HEAD WITHOUT CONTRAST TECHNIQUE: Contiguous axial images were obtained from the base of the skull through the vertex without intravenous contrast. COMPARISON:  None. FINDINGS: Brain: No evidence of acute infarction, hemorrhage, hydrocephalus, extra-axial collection or mass lesion/mass effect. Diffuse cerebral atrophy. Patchy low-attenuation changes in the white matter consistent with  small vessel ischemic changes. Vascular: Intracranial arterial vascular calcifications are present. Skull: The calvarium appears intact. Small left frontal subcutaneous scalp hematoma. Sinuses/Orbits: Mucosal thickening in the paranasal sinuses. No acute air-fluid levels. Mastoid air cells are clear. Previous bilateral ocular banding procedures. Other: Congenital nonunion of the posterior arch of C1. IMPRESSION: 1. No acute intracranial abnormalities. 2. Chronic atrophy and small vessel ischemic changes in the white matter. 3. Small left frontal subcutaneous scalp hematoma. Electronically Signed   By: Lucienne Capers M.D.   On: 08/21/2020 03:54    Impression: Symptomatic anemia, dark stools, FOBT positive, macrocytosis Elevated BUN/creatinine ratio of 38/1.31 with a GFR of 48  CT angio of the abdomen and pelvis from 1/21 showed severe diverticulosis, 2.1 cm cystic mass in the neck of pancreas with a follow-up recommended in 2 years with MRI  CT head on admission showed a small left frontal subcutaneous scalp hematoma and chronic atrophy and small vessel ischemic changes, no acute abnormalities  Plan: Patient has finished 1 unit PRBC transfusion, appears hemodynamically stable(BP 124/58, HR 76/min, saturating 100% on room air). Second unit PRBC transfusion to be started soon. Will start patient on PPI IV twice daily with plans for endoscopy today. The risks and the benefits of the procedure were discussed with the patient in details, he verbalized understanding and consents.   LOS: 0 days   Ronnette Juniper, MD  08/21/2020, 8:26 AM

## 2020-08-22 LAB — CBC
HCT: 19.9 % — ABNORMAL LOW (ref 39.0–52.0)
Hemoglobin: 6.4 g/dL — CL (ref 13.0–17.0)
MCH: 30.5 pg (ref 26.0–34.0)
MCHC: 32.2 g/dL (ref 30.0–36.0)
MCV: 94.8 fL (ref 80.0–100.0)
Platelets: 205 10*3/uL (ref 150–400)
RBC: 2.1 MIL/uL — ABNORMAL LOW (ref 4.22–5.81)
RDW: 15.5 % (ref 11.5–15.5)
WBC: 10.1 10*3/uL (ref 4.0–10.5)
nRBC: 0 % (ref 0.0–0.2)

## 2020-08-22 LAB — COMPREHENSIVE METABOLIC PANEL
ALT: 13 U/L (ref 0–44)
AST: 28 U/L (ref 15–41)
Albumin: 2.5 g/dL — ABNORMAL LOW (ref 3.5–5.0)
Alkaline Phosphatase: 42 U/L (ref 38–126)
Anion gap: 7 (ref 5–15)
BUN: 28 mg/dL — ABNORMAL HIGH (ref 8–23)
CO2: 24 mmol/L (ref 22–32)
Calcium: 8.5 mg/dL — ABNORMAL LOW (ref 8.9–10.3)
Chloride: 107 mmol/L (ref 98–111)
Creatinine, Ser: 1.3 mg/dL — ABNORMAL HIGH (ref 0.61–1.24)
GFR calc Af Amer: 56 mL/min — ABNORMAL LOW (ref >60)
GFR calc non Af Amer: 49 mL/min — ABNORMAL LOW (ref >60)
Glucose, Bld: 99 mg/dL (ref 70–99)
Potassium: 3.7 mmol/L (ref 3.5–5.1)
Sodium: 138 mmol/L (ref 135–145)
Total Bilirubin: 0.7 mg/dL (ref 0.3–1.2)
Total Protein: 4.5 g/dL — ABNORMAL LOW (ref 6.5–8.1)

## 2020-08-22 LAB — HEMOGLOBIN AND HEMATOCRIT, BLOOD
HCT: 23.6 % — ABNORMAL LOW (ref 39.0–52.0)
Hemoglobin: 7.4 g/dL — ABNORMAL LOW (ref 13.0–17.0)

## 2020-08-22 LAB — PREPARE RBC (CROSSMATCH)

## 2020-08-22 MED ORDER — SODIUM CHLORIDE 0.9% IV SOLUTION: Freq: Once | INTRAVENOUS | Status: DC

## 2020-08-22 NOTE — Anesthesia Postprocedure Evaluation (Signed)
Anesthesia Post Note  Patient: Roy David  Procedure(s) Performed: ESOPHAGOGASTRODUODENOSCOPY (EGD) WITH PROPOFOL (N/A ) BIOPSY     Patient location during evaluation: PACU Anesthesia Type: MAC Level of consciousness: awake and alert Pain management: pain level controlled Vital Signs Assessment: post-procedure vital signs reviewed and stable Respiratory status: spontaneous breathing, nonlabored ventilation, respiratory function stable and patient connected to nasal cannula oxygen Cardiovascular status: stable and blood pressure returned to baseline Postop Assessment: no apparent nausea or vomiting Anesthetic complications: no   No complications documented.  Last Vitals:  Vitals:   08/21/20 1623 08/21/20 2341  BP: 119/68 106/66  Pulse: 87 76  Resp: 16 16  Temp: 37.3 C 37.3 C  SpO2: 99%     Last Pain:  Vitals:   08/22/20 0000  TempSrc:   PainSc: 0-No pain                 Tiajuana Amass

## 2020-08-22 NOTE — Progress Notes (Signed)
PROGRESS NOTE    Roy David  PJK:[id number] DOB: [birth year] DOA: 08/20/2020 PCP: Roy David, L.Marlou Sa, MD   Brief Narrative:  Roy David is a 86 y.o. male with medical history significant for paroxysmal A. fib, not on oral anticoagulation due to history of diverticular bleed, iron deficiency anemia, hypothyroidism, peripheral neuropathy, hyperlipidemia, and CAD who presented to [facility] ED from home via EMS due to worsening fatigue and dyspnea of 2 weeks duration.  He had a near syncopal episode at home on the day of presentation, states he nearly passed out.  His wife called EMS.  Associated with dark stools which he thought was secondary to taking iron supplement daily.  Denies any abdominal pain or vomiting.  Denies any bright red blood per rectum.  In the ED, hemoglobin 5.3 with positive FOBT.  2 unit PRBCs ordered to be transfused.  EDP consulted [facility], Dr. Therisa Doyne, will see in the morning.  [facility] asked admit. While urinating in the bathroom in the ED, patient felt dizzy and fell.  Denies any loss of consciousness.  CT head unremarkable for any acute intracranial findings but showing small left frontal subcutaneous scalp hematoma.   Assessment & Plan:   Active Problems:   Symptomatic anemia   Severe acute symptomatic anemia secondary to bleeding gastric mass (unspecified), POA  - Presented with worsening dyspnea, a near syncopal episode with fall at admission, fatigue, dark stools - Hemoglobin 5.3 initially with positive FOBT - 1 additional unit given overnight (3u since admission). -Repeat H&H this afternoon 7.4 - [facility] endoscopy revealed gastric mass, biopsies pending - CT chest abdomen pelvis remarkable for localized nodularity along gastric lining - Pending biopsy we will likely sideline surgery/oncology for further insight and recommendations. - Diet advancement per GI -currently on clears  CKD 3A  with questionable AKI - suspect prerenal in the setting of poor oral intake -Near  baseline, continue fluids, transfusion as above -Continue to follow with morning labs  Paroxysmal A. fib, not on oral anticoagulation due to history of GI bleed - Continue to hold off anticoagulation - Not on any rate control agents. - Monitor on telemetry  Chronic diastolic CHF with grade 1 diastolic dysfunction - 2D echo done on 02/29/2020 showing normal LVEF 55 to 60% and grade 1 diastolic dysfunction - Euvolemic on exam - Resume home cardiac medications - Monitor strict I's and O's and daily weight.  Incidentally noted splenic abnormality - possible infarcts - As per CT - Continue to follow clinically - cannot rule out metastatic disease in the setting of above  Leukocytosis, suspect reactive in the setting of GI bleed, resolved - Presented with WBC 13.3K-->10k - Nontoxic-appearing - Afebrile - Continue to hold off antibiotics for now - Repeat CBC with differential in the morning  Polyneuropathy - Resume home gabapentin  Hypothyroidism - Resume home levothyroxine  Hyperlipidemia - Resume Vytorin  History of diverticulosis and diverticular bleed - Colonoscopy done on 01/05/2020 when presented with melena showing diverticulosis   DVT prophylaxis: SCDs.  Not on pharmacological DVT prophylaxis due to severe anemia and suspected GI bleed Code Status: Full code Family Communication: None present  Status is: Inpatient  Dispo: The patient is from: Home              Anticipated d/c is to: Home              Anticipated d/c date is: 48 to 72 hours pending clinical course  Patient currently not medically stable for discharge due to ongoing anemia, possible need for further endoscopy or evaluation.  Consultants:   GI  Procedures:   Upper endoscopy with biopsy  Antimicrobials:  None indicated  Subjective: No acute issues or events overnight, denies chest pain, shortness of breath, nausea, vomiting, diarrhea, constipation, headache, fevers,  chills.  Objective: Vitals:   08/21/20 2341 08/22/20 0527 08/22/20 0541 08/22/20 0559  BP: 106/66 128/66 118/61 (!) 118/57  Pulse: 76 79 72 67  Resp: 16  16 16   Temp: 99.1 F (37.3 C) 98.4 F (36.9 C) 98.4 F (36.9 C) 98.2 F (36.8 C)  TempSrc: Oral Oral  Oral  SpO2:  99% 97% 98%  Weight:      Height:        Intake/Output Summary (Last 24 hours) at 08/22/2020 0717 Last data filed at 08/21/2020 2200 Gross per 24 hour  Intake 1107.83 ml  Output 1375 ml  Net -267.17 ml   Filed Weights   08/21/20 0933  Weight: 68.9 kg    Examination:  General:  Pleasantly resting in bed, No acute distress. HEENT:  Normocephalic atraumatic.  Sclerae nonicteric, noninjected.  Extraocular movements intact bilaterally. Neck:  Without mass or deformity.  Trachea is midline. Lungs:  Clear to auscultate bilaterally without rhonchi, wheeze, or rales. Heart:  Regular rate and rhythm.  Without murmurs, rubs, or gallops. Abdomen:  Soft, nontender, nondistended.  Without guarding or rebound. Extremities: Without cyanosis, clubbing, edema, or obvious deformity. Vascular:  Dorsalis pedis and posterior tibial pulses palpable bilaterally. Skin:  Warm and dry, no erythema, no ulcerations.    Data Reviewed: I have personally reviewed following labs and imaging studies  CBC: Recent Labs  Lab 08/20/20 1845 08/21/20 1201 08/22/20 0326  WBC 13.3* 11.5* 10.1  NEUTROABS  --  9.3*  --   HGB 5.3* 7.6* 6.4*  HCT 18.2* 24.4* 19.9*  MCV 104.6* 97.2 94.8  PLT 263 232 614   Basic Metabolic Panel: Recent Labs  Lab 08/20/20 1845 08/21/20 1201 08/22/20 0326  NA 138 140 138  K 3.9 3.8 3.7  CL 108 108 107  CO2 19* 21* 24  GLUCOSE 104* 106* 99  BUN 38* 31* 28*  CREATININE 1.31* 1.14 1.30*  CALCIUM 8.6* 8.6* 8.5*   GFR: Estimated Creatinine Clearance: 38 mL/min (A) (by C-G formula based on SCr of 1.3 mg/dL (H)). Liver Function Tests: Recent Labs  Lab 08/21/20 1201 08/22/20 0326  AST 25 28  ALT  13 13  ALKPHOS 43 42  BILITOT 1.1 0.7  PROT 5.0* 4.5*  ALBUMIN 2.7* 2.5*   No results for input(s): LIPASE, AMYLASE in the last 168 hours. No results for input(s): AMMONIA in the last 168 hours. Coagulation Profile: No results for input(s): INR, PROTIME in the last 168 hours. Cardiac Enzymes: No results for input(s): CKTOTAL, CKMB, CKMBINDEX, TROPONINI in the last 168 hours. BNP (last 3 results) No results for input(s): PROBNP in the last 8760 hours. HbA1C: No results for input(s): HGBA1C in the last 72 hours. CBG: Recent Labs  Lab 08/21/20 0348  GLUCAP 108*   Lipid Profile: No results for input(s): CHOL, HDL, LDLCALC, TRIG, CHOLHDL, LDLDIRECT in the last 72 hours. Thyroid Function Tests: No results for input(s): TSH, T4TOTAL, FREET4, T3FREE, THYROIDAB in the last 72 hours. Anemia Panel: No results for input(s): VITAMINB12, FOLATE, FERRITIN, TIBC, IRON, RETICCTPCT in the last 72 hours. Sepsis Labs: No results for input(s): PROCALCITON, LATICACIDVEN in the last 168 hours.  Recent Results (from  the past 240 hour(s))  SARS Coronavirus 2 by RT PCR (hospital order, performed in [facility] hospital lab) Nasopharyngeal Nasopharyngeal Swab     Status: None   Collection Time: 08/21/20  3:40 AM   Specimen: Nasopharyngeal Swab  Result Value Ref Range Status   SARS Coronavirus 2 NEGATIVE NEGATIVE Final    Comment: (NOTE) SARS-CoV-2 target nucleic acids are NOT DETECTED.  The SARS-CoV-2 RNA is generally detectable in upper and lower respiratory specimens during the acute phase of infection. The lowest concentration of SARS-CoV-2 viral copies this assay can detect is 250 copies / mL. A negative result does not preclude SARS-CoV-2 infection and should not be used as the sole basis for treatment or other patient management decisions.  A negative result may occur with improper specimen collection / handling, submission of specimen other than nasopharyngeal swab, presence of viral  mutation(s) within the areas targeted by this assay, and inadequate number of viral copies (<250 copies / mL). A negative result must be combined with clinical observations, patient history, and epidemiological information.  Fact Sheet for Patients:   StrictlyIdeas.no  Fact Sheet for Healthcare Providers: BankingDealers.co.za  This test is not yet approved or  cleared by the [place] FDA and has been authorized for detection and/or diagnosis of SARS-CoV-2 by FDA under an Emergency Use Authorization (EUA).  This EUA will remain in effect (meaning this test can be used) for the duration of the COVID-19 declaration under Section 564(b)(1) of the Act, 21 U.S.C. section 360bbb-3(b)(1), unless the authorization is terminated or revoked sooner.  Performed at [facility], [place] [address], [place], [place] [address]          Radiology Studies: CT Head Wo Contrast  Result Date: 08/21/2020 CLINICAL DATA:  Dizziness for 1 week with syncopal episode this morning. EXAM: CT HEAD WITHOUT CONTRAST TECHNIQUE: Contiguous axial images were obtained from the base of the skull through the vertex without intravenous contrast. COMPARISON:  None. FINDINGS: Brain: No evidence of acute infarction, hemorrhage, hydrocephalus, extra-axial collection or mass lesion/mass effect. Diffuse cerebral atrophy. Patchy low-attenuation changes in the white matter consistent with small vessel ischemic changes. Vascular: Intracranial arterial vascular calcifications are present. Skull: The calvarium appears intact. Small left frontal subcutaneous scalp hematoma. Sinuses/Orbits: Mucosal thickening in the paranasal sinuses. No acute air-fluid levels. Mastoid air cells are clear. Previous bilateral ocular banding procedures. Other: Congenital nonunion of the posterior arch of C1. IMPRESSION: 1. No acute intracranial abnormalities. 2. Chronic atrophy and small vessel ischemic  changes in the white matter. 3. Small left frontal subcutaneous scalp hematoma. Electronically Signed   By: Lucienne Capers M.D.   On: 08/21/2020 03:54   CT CHEST ABDOMEN PELVIS W CONTRAST  Result Date: 08/21/2020 CLINICAL DATA:  Gastric mass in GI bleeding EXAM: CT CHEST, ABDOMEN, AND PELVIS WITH CONTRAST TECHNIQUE: Multidetector CT imaging of the chest, abdomen and pelvis was performed following the standard protocol during bolus administration of intravenous contrast. CONTRAST:  138mL OMNIPAQUE IOHEXOL 300 MG/ML  SOLN COMPARISON:  12/31/2019 FINDINGS: CT CHEST FINDINGS Cardiovascular: Calcified atheromatous plaque in the thoracic aorta. 4.2 cm ascending aortic dilation, no comparison of this area. Central pulmonary vasculature, unremarkable on venous phase assessment. Calcifications of the aortic valve and mitral annular calcification. Calcified coronary artery disease.  No pericardial fluid. Mediastinum/Nodes: No adenopathy in the chest. Esophagus mildly patulous. Thickening of the gastroesophageal junction. Lungs/Pleura: Background pulmonary emphysema. Mild subpleural reticulation. Scattered tree-in-bud opacities, for instance on image 47 of series 4 in the  posterior RIGHT upper lobe. Biapical pleural and parenchymal scarring. Calcified nodule in the RIGHT mid chest is compatible with granuloma. Airways are patent. No consolidation. No pleural effusion. Musculoskeletal: No acute musculoskeletal process. Spinal degenerative changes in the thoracic spine. CT ABDOMEN PELVIS FINDINGS Hepatobiliary: Low-density hepatic lesion in the RIGHT hepatic lobe is stable compared to the prior study, well-circumscribed and showing water density compatible with small cyst. Liver contours are smooth. No suspicious hepatic lesion. Portal vein is patent. Hepatic veins are patent. No pericholecystic stranding.  No biliary duct distension. Pancreas: No peripancreatic stranding. Cystic area in the pancreatic body 2.1 x 1.7 cm,  previously 2.1 x 1.6 cm for Spleen: Areas of low attenuation in the peripheral spleen with wedge-shaped characteristics suggesting small splenic infarcts. More geographic area of low attenuation seen along the lateral margin of the spleen measuring approximately 12 mm. Adrenals/Urinary Tract: Adrenal glands are normal. Symmetric renal enhancement. Cortical scarring of bilateral kidneys. Mild fullness of the proximal RIGHT ureter is stable, no hydronephrosis. Stomach/Bowel: Stomach is under distended limiting assessment. No discrete mass is noted. There is diffuse gastric thickening with mural stratification. Significance uncertain though certainly could be seen in the setting of diffuse gastric neoplasm or with gastritis. Mild perigastric stranding may reflect changes of recent intervention Signs of colonic diverticulosis.  No sign of diverticulitis. Vascular/Lymphatic: Calcified and noncalcified atheromatous plaque of the abdominal aorta, extending into the iliac vessels. Small lymph nodes throughout the hepatic gastric ligament without pathologic enlargement in the 4-5 mm range. However, in the coronal plane there is diffuse nodular and hazy soft tissue stranding throughout the hepatic gastric ligament. Early local tumor infiltration is considered. No retroperitoneal adenopathy. No pelvic lymphadenopathy. Reproductive: Prostate with brachytherapy seeds in place. Other: No ascites. Musculoskeletal: No acute musculoskeletal finding. No destructive bone process. IMPRESSION: 1. Diffuse gastric thickening with mural stratification in this patient with known gastric neoplasm. Superimposed gastritis could accentuate this appearance. 2. Coronal images display soft tissue infiltrating the hepatic gastric recess best seen on image 30 of series 94 where there is a focus extending from the wall of the stomach approximately 12 x 8 mm. Findings are concerning for early tumor infiltration beyond the wall the stomach. Hazy  nodularity can be seen throughout the hepatic gastric ligament best seen on image 24 of series 6. Also some nodularity along short gastrics along the greater curvature of the stomach, also suspicious for extra gastric involvement. These nodules track along the gastrosplenic ligament there is general hazy nodularity in this area. 3. Stable cystic area in the pancreas, attention on follow-up. Dedicated MRI/MRCP could be considered as clinically indicated for further assessment in 6 months. 4. Areas of low attenuation in the peripheral spleen with wedge-shaped characteristics suggesting small splenic infarcts. More geographic area of low attenuation along the lateral margin of the spleen measuring approximately 12 mm. These may represent a small infarct. Consider close follow-up to exclude the possibility of small splenic lesion/metastasis though this would be unusual. MRI could be potentially helpful in differentiating these considerations. 5. Scattered tree-in-bud opacities in the posterior RIGHT upper lobe may reflect an infectious or inflammatory process. Recommend attention on follow-up. 6. 4.2 cm ascending aortic dilation, compatible with mild aneurysmal dilation. Recommend annual imaging followup by CTA or MRA. This recommendation follows 2010 ACCF/AHA/AATS/ACR/ASA/SCA/SCAI/SIR/STS/SVM Guidelines for the Diagnosis and Management of Patients with Thoracic Aortic Disease. Circulation. 2010; 121: K812-X517. Aortic aneurysm NOS (ICD10-I71.9) 7. Prostate with brachytherapy seeds in place. 8. Emphysema and aortic atherosclerosis. Aortic Atherosclerosis (ICD10-I70.0) and  Emphysema (ICD10-J43.9). Electronically Signed   By: Zetta Bills M.D.   On: 08/21/2020 17:52   Scheduled Meds: . sodium chloride   Intravenous Once  . budesonide  0.25 mg Inhalation BID  . cholecalciferol  1,000 Units Oral Daily  . EPINEPHrine  0.3 mg Intramuscular Once  . ezetimibe  10 mg Oral q1800   And  . simvastatin  20 mg Oral q1800   . ferrous sulfate  325 mg Oral Q breakfast  . gabapentin  300 mg Oral BID  . levothyroxine  100 mcg Oral Daily  . pantoprazole (PROTONIX) IV  40 mg Intravenous Q12H  . vitamin B-12  1,000 mcg Oral Daily    LOS: 1 day   Time spent: 65min  Morton C Avika Carbine, DO [facility]  If 7PM-7AM, please contact night-coverage [email or web address]  08/22/2020, 7:17 AM

## 2020-08-22 NOTE — Progress Notes (Signed)
No complaints today.  Pathology from EGD is pending.  We will revisit when biopsy results are back.

## 2020-08-22 NOTE — Significant Event (Signed)
HOSPITAL MEDICINE OVERNIGHT EVENT NOTE    Notified by nursing that hgb is 6.4 on most recent CBC.   Chart reviewed, patient underwent EGD earlier in the day on 9/12 revealing circumferential mass with oozing in the stomach - likely the cause of this progressive anemia.  1 unit PRBC transfusion ordered.    I have confirmed that the patient is additionally on Protonix 40mg  IV Q12 and is not on any blood thinners.  Vernelle Emerald  MD [facility]

## 2020-08-23 ENCOUNTER — Ambulatory Visit: Payer: Medicare Other | Admitting: Medical

## 2020-08-23 LAB — TYPE AND SCREEN
ABO/RH(D): A POS
Antibody Screen: NEGATIVE
Unit division: 0
Unit division: 0
Unit division: 0

## 2020-08-23 LAB — CBC
HCT: 25.8 % — ABNORMAL LOW (ref 39.0–52.0)
Hemoglobin: 8.1 g/dL — ABNORMAL LOW (ref 13.0–17.0)
MCH: 29.8 pg (ref 26.0–34.0)
MCHC: 31.4 g/dL (ref 30.0–36.0)
MCV: 94.9 fL (ref 80.0–100.0)
Platelets: 192 10*3/uL (ref 150–400)
RBC: 2.72 MIL/uL — ABNORMAL LOW (ref 4.22–5.81)
RDW: 15.8 % — ABNORMAL HIGH (ref 11.5–15.5)
WBC: 11.6 10*3/uL — ABNORMAL HIGH (ref 4.0–10.5)
nRBC: 0 % (ref 0.0–0.2)

## 2020-08-23 LAB — BPAM RBC
Blood Product Expiration Date: 202110012359
Blood Product Expiration Date: 202110012359
Blood Product Expiration Date: 202110032359
ISSUE DATE / TIME: 202109120508
ISSUE DATE / TIME: 202109120842
ISSUE DATE / TIME: 202109130519
Unit Type and Rh: 6200
Unit Type and Rh: 6200
Unit Type and Rh: 6200

## 2020-08-23 LAB — COMPREHENSIVE METABOLIC PANEL
ALT: 16 U/L (ref 0–44)
AST: 34 U/L (ref 15–41)
Albumin: 2.9 g/dL — ABNORMAL LOW (ref 3.5–5.0)
Alkaline Phosphatase: 50 U/L (ref 38–126)
Anion gap: 10 (ref 5–15)
BUN: 20 mg/dL (ref 8–23)
CO2: 22 mmol/L (ref 22–32)
Calcium: 8.5 mg/dL — ABNORMAL LOW (ref 8.9–10.3)
Chloride: 99 mmol/L (ref 98–111)
Creatinine, Ser: 1.37 mg/dL — ABNORMAL HIGH (ref 0.61–1.24)
GFR calc Af Amer: 53 mL/min — ABNORMAL LOW (ref >60)
GFR calc non Af Amer: 46 mL/min — ABNORMAL LOW (ref >60)
Glucose, Bld: 164 mg/dL — ABNORMAL HIGH (ref 70–99)
Potassium: 3.1 mmol/L — ABNORMAL LOW (ref 3.5–5.1)
Sodium: 131 mmol/L — ABNORMAL LOW (ref 135–145)
Total Bilirubin: 0.8 mg/dL (ref 0.3–1.2)
Total Protein: 5.2 g/dL — ABNORMAL LOW (ref 6.5–8.1)

## 2020-08-23 MED ORDER — ESOMEPRAZOLE MAGNESIUM 40 MG PO CPDR: 40.0000 mg | DELAYED_RELEASE_CAPSULE | Freq: Every day | ORAL | 1 refills | Status: DC

## 2020-08-23 NOTE — Progress Notes (Signed)
Patient was seen.  Wife was present in the room.  I informed them that the biopsies of the stomach showed adenocarcinoma.  He needs an oncology consult.  They would like to see Dr. Burney Gauze, as this is his wife's doctor and she has known him for years.  As long as he is eating I think he is probably stable for discharge and outpatient follow-up.  We will sign off.  Call us if needed.

## 2020-08-23 NOTE — Progress Notes (Addendum)
Pt had solid food lunch tray, ate some of his grilled chicken and vegetables, wife says little less than 50% of all food.  Tolerating well, denies any nausea or pain. Will inform  Dr Avon Gully.

## 2020-08-23 NOTE — Progress Notes (Signed)
Discharge instructions reviewed with pt and his wife.  Copy of instructions given to pt, informed script sent to his pharmacy. Handouts given on GI bleed, anemia, nexium.  Pt d/c'd via wheelchair with belongings, with wife.           Escorted by unit NT.

## 2020-08-23 NOTE — Discharge Summary (Signed)
Physician Discharge Summary  Ezzard Ditmer TDH:[id number] DOB: March 30, 1930 DOA: 08/20/2020  PCP: [name], L.Marlou Sa, MD  Admit date: 08/20/2020 Discharge date: 08/23/2020  Admitted From: Home Disposition:  Home  Recommendations for Outpatient Follow-up:  1. Follow up with PCP in 1-2 weeks 2. Follow up with Oncology: Dr. Marin Olp as scheduled   Discharge Condition:Stable  CODE STATUS:Full  Diet recommendation: As tolerated    Brief/Interim Summary: Yetta Flock a 86 y.o.malewith medical history significant forparoxysmalA. fib, noton oral anticoagulation due to history of diverticular bleed, iron deficiency anemia, hypothyroidism, peripheral neuropathy, hyperlipidemia, and CADwho [facility] due to worsening fatigue and dyspnea of 2 weeks duration. He had a near syncopal episode at [facility] the day of presentation, states he nearly passed out.His wife called EMS.Associated with dark stoolswhich he thought was secondary to taking iron supplement daily.Denies any abdominal pain or vomiting. Denies any bright red blood per rectum. In the ED, hemoglobin 5.3with positive FOBT. 2 unit PRBCs ordered to be transfused. EDP consulted [facility], Dr. Margarite Gouge see inthe morning.TR[facility] asked admit. While urinating in the bathroom in the ED, patient felt dizzy and fell. Denies any loss of consciousness. CT head unremarkable for any acute intracranial findings but showing small left frontal subcutaneous scalp hematoma.  Patient met as above with acute symptomatic anemia concerning for GI bleed, underwent endoscopy over the weekend remarkable for abdominal gastric mass, CT abdomen pelvis shows questionable mass at the greater curvature of the stomach.  Biopsies taken on endoscopy have initially resulted with unspecified adenocarcinoma.  At this time patient's hemoglobin has improved after 3 units PRBC since admission, hemoglobin now stable, no longer reporting black or  tarry stool.  At this time patient is otherwise stable and agreeable for discharge home, he is chosen to follow-up with Dr. Marin Olp in the outpatient setting, his wife is previously followed with their office and they are awaiting scheduled appointment for further evaluation and treatment of unspecified adenocarcinoma of the gastric lining as outlined above.  Otherwise follow-up with PCP in the next 1 to 2 weeks for repeat evaluation and repeat CBC to ensure hemoglobin remains within normal limits.  Patient was discharged on PPI which is his only new medication.  Otherwise patient has been informed to return back to the hospital or PCP if he continues to have worsening fatigue weakness and lethargy as he had prior as this may be a sign of worsening anemia.  Discharge Diagnoses:  Active Problems:   Symptomatic anemia    Discharge Instructions  Discharge Instructions    Call MD for:  extreme fatigue   Complete by: As directed    Call MD for:  severe uncontrolled pain   Complete by: As directed    Diet - low sodium heart healthy   Complete by: As directed    Increase activity slowly   Complete by: As directed      Allergies as of 08/23/2020      Reactions   Bee Venom Nausea Only, Other (See Comments)   Wasp and yellow jackets      Medication List    TAKE these medications   Asmanex (60 Metered Doses) 220 MCG/INH inhaler Generic drug: mometasone Inhale 1 Inhaler into the lungs daily as needed (asthma flare up).   aspirin EC 81 MG tablet Take 81 mg by mouth daily.   cholecalciferol 1000 units tablet Commonly known as: VITAMIN D Take 1,000 Units by mouth daily.   Coenzyme Q10 200 MG Tabs Take 200  mg by mouth daily.   doxycycline 100 MG capsule Commonly known as: MONODOX Take 100 mg by mouth 2 (two) times daily as needed (asthma flare up).   EPINEPHrine 0.3 mg/0.3 mL Soaj injection Commonly known as: EPI-PEN Inject 0.3 mg into the muscle once.   esomeprazole 40 MG  capsule Commonly known as: NexIUM Take 1 capsule (40 mg total) by mouth daily.   ezetimibe-simvastatin 10-20 MG tablet Commonly known as: VYTORIN Take 1 tablet by mouth daily.   ferrous sulfate 325 (65 FE) MG tablet Take 325 mg by mouth daily with breakfast.   gabapentin 300 MG capsule Commonly known as: NEURONTIN Take 300 mg by mouth 2 (two) times daily.   GLUCOSAMINE-CHONDROITIN PO Take 1 tablet by mouth daily.   levothyroxine 100 MCG tablet Commonly known as: SYNTHROID Take 100 mcg by mouth daily.   predniSONE 20 MG tablet Commonly known as: DELTASONE Take 20 mg by mouth 2 (two) times daily as needed (asthma flare up).   triamcinolone cream 0.1 % Commonly known as: KENALOG Apply 1 application topically 2 (two) times daily as needed (itching).   vitamin B-12 1000 MCG tablet Commonly known as: CYANOCOBALAMIN Take 1,000 mcg by mouth daily.       Allergies  Allergen Reactions   Bee Venom Nausea Only and Other (See Comments)    Wasp and yellow jackets    Consultations:  GI  Procedures/Studies: CT Head Wo Contrast  Result Date: 08/21/2020 CLINICAL DATA:  Dizziness for 1 week with syncopal episode this morning. EXAM: CT HEAD WITHOUT CONTRAST TECHNIQUE: Contiguous axial images were obtained from the base of the skull through the vertex without intravenous contrast. COMPARISON:  None. FINDINGS: Brain: No evidence of acute infarction, hemorrhage, hydrocephalus, extra-axial collection or mass lesion/mass effect. Diffuse cerebral atrophy. Patchy low-attenuation changes in the white matter consistent with small vessel ischemic changes. Vascular: Intracranial arterial vascular calcifications are present. Skull: The calvarium appears intact. Small left frontal subcutaneous scalp hematoma. Sinuses/Orbits: Mucosal thickening in the paranasal sinuses. No acute air-fluid levels. Mastoid air cells are clear. Previous bilateral ocular banding procedures. Other: Congenital nonunion of  the posterior arch of C1. IMPRESSION: 1. No acute intracranial abnormalities. 2. Chronic atrophy and small vessel ischemic changes in the white matter. 3. Small left frontal subcutaneous scalp hematoma. Electronically Signed   By: Lucienne Capers M.D.   On: 08/21/2020 03:54   CT CHEST ABDOMEN PELVIS W CONTRAST  Result Date: 08/21/2020 CLINICAL DATA:  Gastric mass in GI bleeding EXAM: CT CHEST, ABDOMEN, AND PELVIS WITH CONTRAST TECHNIQUE: Multidetector CT imaging of the chest, abdomen and pelvis was performed following the standard protocol during bolus administration of intravenous contrast. CONTRAST:  157m OMNIPAQUE IOHEXOL 300 MG/ML  SOLN COMPARISON:  12/31/2019 FINDINGS: CT CHEST FINDINGS Cardiovascular: Calcified atheromatous plaque in the thoracic aorta. 4.2 cm ascending aortic dilation, no comparison of this area. Central pulmonary vasculature, unremarkable on venous phase assessment. Calcifications of the aortic valve and mitral annular calcification. Calcified coronary artery disease.  No pericardial fluid. Mediastinum/Nodes: No adenopathy in the chest. Esophagus mildly patulous. Thickening of the gastroesophageal junction. Lungs/Pleura: Background pulmonary emphysema. Mild subpleural reticulation. Scattered tree-in-bud opacities, for instance on image 47 of series 4 in the posterior RIGHT upper lobe. Biapical pleural and parenchymal scarring. Calcified nodule in the RIGHT mid chest is compatible with granuloma. Airways are patent. No consolidation. No pleural effusion. Musculoskeletal: No acute musculoskeletal process. Spinal degenerative changes in the thoracic spine. CT ABDOMEN PELVIS FINDINGS Hepatobiliary: Low-density hepatic lesion in the  RIGHT hepatic lobe is stable compared to the prior study, well-circumscribed and showing water density compatible with small cyst. Liver contours are smooth. No suspicious hepatic lesion. Portal vein is patent. Hepatic veins are patent. No pericholecystic  stranding.  No biliary duct distension. Pancreas: No peripancreatic stranding. Cystic area in the pancreatic body 2.1 x 1.7 cm, previously 2.1 x 1.6 cm for Spleen: Areas of low attenuation in the peripheral spleen with wedge-shaped characteristics suggesting small splenic infarcts. More geographic area of low attenuation seen along the lateral margin of the spleen measuring approximately 12 mm. Adrenals/Urinary Tract: Adrenal glands are normal. Symmetric renal enhancement. Cortical scarring of bilateral kidneys. Mild fullness of the proximal RIGHT ureter is stable, no hydronephrosis. Stomach/Bowel: Stomach is under distended limiting assessment. No discrete mass is noted. There is diffuse gastric thickening with mural stratification. Significance uncertain though certainly could be seen in the setting of diffuse gastric neoplasm or with gastritis. Mild perigastric stranding may reflect changes of recent intervention Signs of colonic diverticulosis.  No sign of diverticulitis. Vascular/Lymphatic: Calcified and noncalcified atheromatous plaque of the abdominal aorta, extending into the iliac vessels. Small lymph nodes throughout the hepatic gastric ligament without pathologic enlargement in the 4-5 mm range. However, in the coronal plane there is diffuse nodular and hazy soft tissue stranding throughout the hepatic gastric ligament. Early local tumor infiltration is considered. No retroperitoneal adenopathy. No pelvic lymphadenopathy. Reproductive: Prostate with brachytherapy seeds in place. Other: No ascites. Musculoskeletal: No acute musculoskeletal finding. No destructive bone process. IMPRESSION: 1. Diffuse gastric thickening with mural stratification in this patient with known gastric neoplasm. Superimposed gastritis could accentuate this appearance. 2. Coronal images display soft tissue infiltrating the hepatic gastric recess best seen on image 30 of series 94 where there is a focus extending from the wall of  the stomach approximately 12 x 8 mm. Findings are concerning for early tumor infiltration beyond the wall the stomach. Hazy nodularity can be seen throughout the hepatic gastric ligament best seen on image 24 of series 6. Also some nodularity along short gastrics along the greater curvature of the stomach, also suspicious for extra gastric involvement. These nodules track along the gastrosplenic ligament there is general hazy nodularity in this area. 3. Stable cystic area in the pancreas, attention on follow-up. Dedicated MRI/MRCP could be considered as clinically indicated for further assessment in 6 months. 4. Areas of low attenuation in the peripheral spleen with wedge-shaped characteristics suggesting small splenic infarcts. More geographic area of low attenuation along the lateral margin of the spleen measuring approximately 12 mm. These may represent a small infarct. Consider close follow-up to exclude the possibility of small splenic lesion/metastasis though this would be unusual. MRI could be potentially helpful in differentiating these considerations. 5. Scattered tree-in-bud opacities in the posterior RIGHT upper lobe may reflect an infectious or inflammatory process. Recommend attention on follow-up. 6. 4.2 cm ascending aortic dilation, compatible with mild aneurysmal dilation. Recommend annual imaging followup by CTA or MRA. This recommendation follows 2010 ACCF/AHA/AATS/ACR/ASA/SCA/SCAI/SIR/STS/SVM Guidelines for the Diagnosis and Management of Patients with Thoracic Aortic Disease. Circulation. 2010; 121: O372-B021. Aortic aneurysm NOS (ICD10-I71.9) 7. Prostate with brachytherapy seeds in place. 8. Emphysema and aortic atherosclerosis. Aortic Atherosclerosis (ICD10-I70.0) and Emphysema (ICD10-J43.9). Electronically Signed   By: Zetta Bills M.D.   On: 08/21/2020 17:52     Subjective: No acute issues/events overnight, denies nausea, vomiting, headache, fevers, or chills. Denies any  dark/maroon/bloody stool. Otherwise feels quite well and markedly improved from admission.   Discharge Exam:  Vitals:   08/23/20 0816 08/23/20 1247  BP:  102/62  Pulse:  68  Resp:  16  Temp:  97.7 F (36.5 C)  SpO2: 99% 100%   Vitals:   08/23/20 0010 08/23/20 0638 08/23/20 0816 08/23/20 1247  BP: (!) 99/53 (!) 105/50  102/62  Pulse: 73 68  68  Resp: _0 Temp: 97.9 F (36.6 C)   97.7 F (36.5 C)  TempSrc: Oral   Oral  SpO2: 97%  99% 100%  Weight:      Height:        General: Pt is alert, awake, not in acute distress Cardiovascular: RRR, S1/S2 +, no rubs, no gallops Respiratory: CTA bilaterally, no wheezing, no rhonchi Abdominal: Soft, NT, ND, bowel sounds + Extremities: no edema, no cyanosis    The results of significant diagnostics from this hospitalization (including imaging, microbiology, ancillary and laboratory) are listed below for reference.     Microbiology: Recent Results (from the past 240 hour(s))  SARS Coronavirus 2 by RT PCR (hospital order, performed in [facility] hospital lab) Nasopharyngeal Nasopharyngeal Swab     Status: None   Collection Time: 08/21/20  3:40 AM   Specimen: Nasopharyngeal Swab  Result Value Ref Range Status   SARS Coronavirus 2 NEGATIVE NEGATIVE Final    Comment: (NOTE) SARS-CoV-2 target nucleic acids are NOT DETECTED.  The SARS-CoV-2 RNA is generally detectable in upper and lower respiratory specimens during the acute phase of infection. The lowest concentration of SARS-CoV-2 viral copies this assay can detect is 250 copies / mL. A negative result does not preclude SARS-CoV-2 infection and should not be used as the sole basis for treatment or other patient management decisions.  A negative result may occur with improper specimen collection / handling, submission of specimen other than nasopharyngeal swab, presence of viral mutation(s) within the areas targeted by this assay, and inadequate number of viral copies (<250  copies / mL). A negative result must be combined with clinical observations, patient history, and epidemiological information.  Fact Sheet for Patients:   StrictlyIdeas.no  Fact Sheet for Healthcare Providers: BankingDealers.co.za  This test is not yet approved or  cleared by the [place] FDA and has been authorized for detection and/or diagnosis of SARS-CoV-2 by FDA under an Emergency Use Authorization (EUA).  This EUA will remain in effect (meaning this test can be used) for the duration of the COVID-19 declaration under Section 564(b)(1) of the Act, 21 U.S.C. section 360bbb-3(b)(1), unless the authorization is terminated or revoked sooner.  Performed at [facility], [place] [address] [place], [place] [address]      Labs: BNP (last 3 results) No results for input(s): BNP in the last 8760 hours. Basic Metabolic Panel: Recent Labs  Lab 08/20/20 1845 08/21/20 1201 08/22/20 0326 08/23/20 0755  NA 138 140 138 131*  K 3.9 3.8 3.7 3.1*  CL 108 108 107 99  CO2 19* 21* 24 22  GLUCOSE 104* 106* 99 164*  BUN 38* 31* 28* 20  CREATININE 1.31* 1.14 1.30* 1.37*  CALCIUM 8.6* 8.6* 8.5* 8.5*   Liver Function Tests: Recent Labs  Lab 08/21/20 1201 08/22/20 0326 08/23/20 0755  AST 25 28 34  ALT _1 ALKPHOS 43 42 50  BILITOT 1.1 0.7 0.8  PROT 5.0* 4.5* 5.2*  ALBUMIN 2.7* 2.5* 2.9*   No results for input(s): LIPASE, AMYLASE in the last 168 hours. No results for input(s): AMMONIA in the last 168 hours.  CBC: Recent Labs  Lab 08/20/20 1845 08/21/20 1201 08/22/20 0326 08/22/20 1057 08/23/20 0755  WBC 13.3* 11.5* 10.1  --  11.6*  NEUTROABS  --  9.3*  --   --   --   HGB 5.3* 7.6* 6.4* 7.4* 8.1*  HCT 18.2* 24.4* 19.9* 23.6* 25.8*  MCV 104.6* 97.2 94.8  --  94.9  PLT 263 232 205  --  192   Cardiac Enzymes: No results for input(s): CKTOTAL, CKMB, CKMBINDEX, TROPONINI in the last 168 hours. BNP: Invalid  input(s): POCBNP CBG: Recent Labs  Lab 08/21/20 0348  GLUCAP 108*   D-Dimer No results for input(s): DDIMER in the last 72 hours. Hgb A1c No results for input(s): HGBA1C in the last 72 hours. Lipid Profile No results for input(s): CHOL, HDL, LDLCALC, TRIG, CHOLHDL, LDLDIRECT in the last 72 hours. Thyroid function studies No results for input(s): TSH, T4TOTAL, T3FREE, THYROIDAB in the last 72 hours.  Invalid input(s): FREET3 Anemia work up No results for input(s): VITAMINB12, FOLATE, FERRITIN, TIBC, IRON, RETICCTPCT in the last 72 hours. Urinalysis No results found for: COLORURINE, APPEARANCEUR, [place], [place], [place], [place], [place], [place], PROTEINUR, UROBILINOGEN, NITRITE, LEUKOCYTESUR Sepsis Labs Invalid input(s): PROCALCITONIN,  WBC,  LACTICIDVEN Microbiology Recent Results (from the past 240 hour(s))  SARS Coronavirus 2 by RT PCR (hospital order, performed in [facility] hospital lab) Nasopharyngeal Nasopharyngeal Swab     Status: None   Collection Time: 08/21/20  3:40 AM   Specimen: Nasopharyngeal Swab  Result Value Ref Range Status   SARS Coronavirus 2 NEGATIVE NEGATIVE Final    Comment: (NOTE) SARS-CoV-2 target nucleic acids are NOT DETECTED.  The SARS-CoV-2 RNA is generally detectable in upper and lower respiratory specimens during the acute phase of infection. The lowest concentration of SARS-CoV-2 viral copies this assay can detect is 250 copies / mL. A negative result does not preclude SARS-CoV-2 infection and should not be used as the sole basis for treatment or other patient management decisions.  A negative result may occur with improper specimen collection / handling, submission of specimen other than nasopharyngeal swab, presence of viral mutation(s) within the areas targeted by this assay, and inadequate number of viral copies (<250 copies / mL). A negative result must be combined with clinical observations, patient history, and epidemiological  information.  Fact Sheet for Patients:   StrictlyIdeas.no  Fact Sheet for Healthcare Providers: BankingDealers.co.za  This test is not yet approved or  cleared by the [place] FDA and has been authorized for detection and/or diagnosis of SARS-CoV-2 by FDA under an Emergency Use Authorization (EUA).  This EUA will remain in effect (meaning this test can be used) for the duration of the COVID-19 declaration under Section 564(b)(1) of the Act, 21 U.S.C. section 360bbb-3(b)(1), unless the authorization is terminated or revoked sooner.  Performed at [facility], [place] [address], [place], [place] [address]      Time coordinating discharge: Over 30 minutes  SIGNED:   Little Ishikawa, DO [facility] 08/23/2020, 12:53 PM Pager   If 7PM-7AM, please contact night-coverage [email or web address]

## 2020-08-24 ENCOUNTER — Encounter (HOSPITAL_COMMUNITY): Payer: Self-pay | Admitting: Gastroenterology

## 2020-08-25 ENCOUNTER — Encounter: Payer: Self-pay | Admitting: Hematology & Oncology

## 2020-08-25 ENCOUNTER — Encounter: Payer: Self-pay | Admitting: *Deleted

## 2020-08-25 ENCOUNTER — Other Ambulatory Visit: Payer: Self-pay | Admitting: *Deleted

## 2020-08-25 ENCOUNTER — Inpatient Hospital Stay: Payer: Medicare Other | Attending: Hematology & Oncology

## 2020-08-25 ENCOUNTER — Other Ambulatory Visit: Payer: Self-pay

## 2020-08-25 ENCOUNTER — Inpatient Hospital Stay (HOSPITAL_BASED_OUTPATIENT_CLINIC_OR_DEPARTMENT_OTHER): Payer: Medicare Other | Admitting: Hematology & Oncology

## 2020-08-25 VITALS — BP 125/61 | HR 75 | Temp 98.2°F | Resp 18 | Ht 68.5 in | Wt 152.0 lb

## 2020-08-25 DIAGNOSIS — Z8249 Family history of ischemic heart disease and other diseases of the circulatory system: Secondary | ICD-10-CM

## 2020-08-25 DIAGNOSIS — Z79899 Other long term (current) drug therapy: Secondary | ICD-10-CM | POA: Diagnosis not present

## 2020-08-25 DIAGNOSIS — K921 Melena: Secondary | ICD-10-CM | POA: Insufficient documentation

## 2020-08-25 DIAGNOSIS — R5383 Other fatigue: Secondary | ICD-10-CM | POA: Insufficient documentation

## 2020-08-25 DIAGNOSIS — M1990 Unspecified osteoarthritis, unspecified site: Secondary | ICD-10-CM

## 2020-08-25 DIAGNOSIS — R0602 Shortness of breath: Secondary | ICD-10-CM | POA: Insufficient documentation

## 2020-08-25 DIAGNOSIS — Z823 Family history of stroke: Secondary | ICD-10-CM | POA: Diagnosis not present

## 2020-08-25 DIAGNOSIS — I480 Paroxysmal atrial fibrillation: Secondary | ICD-10-CM

## 2020-08-25 DIAGNOSIS — Z7289 Other problems related to lifestyle: Secondary | ICD-10-CM

## 2020-08-25 DIAGNOSIS — Z7189 Other specified counseling: Secondary | ICD-10-CM

## 2020-08-25 DIAGNOSIS — C169 Malignant neoplasm of stomach, unspecified: Secondary | ICD-10-CM

## 2020-08-25 DIAGNOSIS — C168 Malignant neoplasm of overlapping sites of stomach: Secondary | ICD-10-CM | POA: Diagnosis not present

## 2020-08-25 DIAGNOSIS — R002 Palpitations: Secondary | ICD-10-CM | POA: Insufficient documentation

## 2020-08-25 DIAGNOSIS — C160 Malignant neoplasm of cardia: Secondary | ICD-10-CM

## 2020-08-25 DIAGNOSIS — M6250 Muscle wasting and atrophy, not elsewhere classified, unspecified site: Secondary | ICD-10-CM | POA: Insufficient documentation

## 2020-08-25 DIAGNOSIS — I10 Essential (primary) hypertension: Secondary | ICD-10-CM | POA: Insufficient documentation

## 2020-08-25 DIAGNOSIS — R42 Dizziness and giddiness: Secondary | ICD-10-CM

## 2020-08-25 DIAGNOSIS — E7800 Pure hypercholesterolemia, unspecified: Secondary | ICD-10-CM

## 2020-08-25 DIAGNOSIS — Z87891 Personal history of nicotine dependence: Secondary | ICD-10-CM | POA: Insufficient documentation

## 2020-08-25 DIAGNOSIS — C162 Malignant neoplasm of body of stomach: Secondary | ICD-10-CM

## 2020-08-25 HISTORY — DX: Malignant neoplasm of stomach, unspecified: C16.9

## 2020-08-25 HISTORY — DX: Other specified counseling: Z71.89

## 2020-08-25 LAB — CBC WITH DIFFERENTIAL (CANCER CENTER ONLY)
Abs Immature Granulocytes: 0.11 10*3/uL — ABNORMAL HIGH (ref 0.00–0.07)
Basophils Absolute: 0.1 10*3/uL (ref 0.0–0.1)
Basophils Relative: 1 %
Eosinophils Absolute: 0.1 10*3/uL (ref 0.0–0.5)
Eosinophils Relative: 1 %
HCT: 25.9 % — ABNORMAL LOW (ref 39.0–52.0)
Hemoglobin: 8.1 g/dL — ABNORMAL LOW (ref 13.0–17.0)
Immature Granulocytes: 1 %
Lymphocytes Relative: 8 %
Lymphs Abs: 0.7 10*3/uL (ref 0.7–4.0)
MCH: 29.9 pg (ref 26.0–34.0)
MCHC: 31.3 g/dL (ref 30.0–36.0)
MCV: 95.6 fL (ref 80.0–100.0)
Monocytes Absolute: 0.8 10*3/uL (ref 0.1–1.0)
Monocytes Relative: 9 %
Neutro Abs: 6.6 10*3/uL (ref 1.7–7.7)
Neutrophils Relative %: 80 %
Platelet Count: 143 10*3/uL — ABNORMAL LOW (ref 150–400)
RBC: 2.71 MIL/uL — ABNORMAL LOW (ref 4.22–5.81)
RDW: 15 % (ref 11.5–15.5)
WBC Count: 8.3 10*3/uL (ref 4.0–10.5)
nRBC: 0 % (ref 0.0–0.2)

## 2020-08-25 LAB — CMP (CANCER CENTER ONLY)
ALT: 12 U/L (ref 0–44)
AST: 29 U/L (ref 15–41)
Albumin: 3.4 g/dL — ABNORMAL LOW (ref 3.5–5.0)
Alkaline Phosphatase: 55 U/L (ref 38–126)
Anion gap: 8 (ref 5–15)
BUN: 23 mg/dL (ref 8–23)
CO2: 26 mmol/L (ref 22–32)
Calcium: 9 mg/dL (ref 8.9–10.3)
Chloride: 102 mmol/L (ref 98–111)
Creatinine: 1.38 mg/dL — ABNORMAL HIGH (ref 0.61–1.24)
GFR, Est AFR Am: 53 mL/min — ABNORMAL LOW (ref >60)
GFR, Estimated: 45 mL/min — ABNORMAL LOW (ref >60)
Glucose, Bld: 116 mg/dL — ABNORMAL HIGH (ref 70–99)
Potassium: 4.4 mmol/L (ref 3.5–5.1)
Sodium: 136 mmol/L (ref 135–145)
Total Bilirubin: 0.5 mg/dL (ref 0.3–1.2)
Total Protein: 5.5 g/dL — ABNORMAL LOW (ref 6.5–8.1)

## 2020-08-25 LAB — SAMPLE TO BLOOD BANK

## 2020-08-25 LAB — SURGICAL PATHOLOGY

## 2020-08-25 LAB — PREPARE RBC (CROSSMATCH)

## 2020-08-25 NOTE — Progress Notes (Signed)
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 Follow-up  : I have stomach cancer.  HPI: Roy David is a very nice 86 year old white male.  He actually is the husband of one of my patients.  He does have health issues.  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  He apparently has had bleeding in the past.  He has had colonoscopies.  Over the past couple weeks, he has gotten weaker.  He has not lost weight.  He has had no nausea or vomiting.  He apparently has been eating okay according to his wife.  He got to the point where he passed out.  His wife says she found him on the floor.  She said that she "brought him back."  EMS was called.  He was brought to [facility].  He was taken there on 08/20/2020.  When he was brought in, his hemoglobin was 5.3.  Platelet count was 263K, his white cell count was 13.3.  His stool was heme positive.  His electrolytes showed BUN of 38 creatinine 1.31.  His albumin was 2.7.  His calcium was 8.6.  He underwent a CT scan of the body on 08/21/2020.  This showed diffuse gastric thickening.  This was felt to be consistent with a gastric neoplasm.  There was soft tissue infiltrating the hepatic gastric recess.  There was some nodularity throughout the hepatogastric ligament.  There is some nodularity along the short gastric arteries.  This all appears to be suspicious for locally advanced disease.  He underwent a upper endoscopy on 08/21/2020.  This, showed a diffusely infiltrative process in the stomach.  He had oozing from this area.  He had a malignant appearing stricture in the lower esophagus.  Biopsies were taken.  The pathology report ([id number]) showed a poorly differentiated adenocarcinoma.  Thankfully, the tumor is HER-2  POSITIVE by FISH.  He was transfused.  Is put on some oral iron.  No iron studies were taken.  We  were notified of for about his situation.  His wife wanted him to come to the [place] because she has seen his for about 20 years.  He comes in a wheelchair.  I must say that overall, his performance status is probably ECOG 2 at best.  He has had no obvious bleeding.  I think there is still some melena.  He has had no chest pain.  He has had no cough or shortness of breath.  There is no history of tobacco use.  He said he smoked but stopped when he was 86 years old.  There is no significant alcohol use.  He is stopped these anticoagulation.  He is currently on baby aspirin.  I told him to stop the baby aspirin.  There is no significant family history for cancer.    Past Medical History:  Diagnosis Date  . Arthritis   . Asthma    followed at [facility], stable on Asmanex  . Diverticulosis   . Dysrhythmia   . Gastric cancer ([place]) 08/25/2020  . H/O seasonal allergies    Dr [name]  . Hypercholesteremia   . Hypertension   . Hypochromic-microcytic anemia   . Microcytic anemia   . Paroxysmal atrial fibrillation ([place]) 03/06/2016   on Xarelto  . Prostate cancer [facility])    12/10- Dr Dahlstedt/Dr Valere Dross  . Thyroid disease    hypothyroidism  :  Past Surgical History:  Procedure Laterality Date  . APPENDECTOMY    . BIOPSY  08/21/2020   Procedure: BIOPSY;  Surgeon: Ronnette Juniper, MD;  Location: [place];  Service: Gastroenterology;;  . COLONOSCOPY N/A 01/05/2020   Procedure: COLONOSCOPY;  Surgeon: Clarene Essex, MD;  Location: [place];  Service: Endoscopy;  Laterality: N/A;  . EP IMPLANTABLE DEVICE N/A 11/29/2015   Procedure: Loop Recorder Insertion;  Surgeon: Sanda Klein, MD;  Location: [place] CV LAB;  Service: Cardiovascular;  Laterality: N/A;  . ESOPHAGOGASTRODUODENOSCOPY (EGD) WITH PROPOFOL N/A 08/21/2020   Procedure: ESOPHAGOGASTRODUODENOSCOPY (EGD) WITH PROPOFOL;  Surgeon: Ronnette Juniper, MD;  Location: [place];  Service: Gastroenterology;  Laterality:  N/A;  . HERNIA REPAIR    . RETINAL DETACHMENT SURGERY    :   Current Outpatient Medications:  .  aspirin EC 81 MG tablet, Take 81 mg by mouth daily., Disp: , Rfl:  .  cholecalciferol (VITAMIN D) 1000 units tablet, Take 1,000 Units by mouth daily. , Disp: , Rfl:  .  Coenzyme Q10 200 MG TABS, Take 200 mg by mouth daily. , Disp: , Rfl:  .  doxycycline (MONODOX) 100 MG capsule, Take 100 mg by mouth 2 (two) times daily as needed (asthma flare up). , Disp: , Rfl:  .  EPINEPHrine 0.3 mg/0.3 mL IJ SOAJ injection, Inject 0.3 mg into the muscle once., Disp: , Rfl:  .  esomeprazole (NEXIUM) 40 MG capsule, Take 1 capsule (40 mg total) by mouth daily., Disp: 30 capsule, Rfl: 1 .  ezetimibe-simvastatin (VYTORIN) 10-20 MG per tablet, Take 1 tablet by mouth daily., Disp: , Rfl:  .  ferrous sulfate 325 (65 FE) MG tablet, Take 325 mg by mouth daily with breakfast., Disp: , Rfl:  .  gabapentin (NEURONTIN) 300 MG capsule, Take 300 mg by mouth 2 (two) times daily. , Disp: , Rfl:  .  GLUCOSAMINE-CHONDROITIN PO, Take 1 tablet by mouth daily. , Disp: , Rfl:  .  levothyroxine (SYNTHROID, LEVOTHROID) 100 MCG tablet, Take 100 mcg by mouth daily. , Disp: , Rfl:  .  mometasone (ASMANEX 60 METERED DOSES) 220 MCG/INH inhaler, Inhale 1 Inhaler into the lungs daily as needed (asthma flare up). , Disp: , Rfl:  .  predniSONE (DELTASONE) 20 MG tablet, Take 20 mg by mouth 2 (two) times daily as needed (asthma flare up). , Disp: , Rfl:  .  triamcinolone cream (KENALOG) 0.1 %, Apply 1 application topically 2 (two) times daily as needed (itching). , Disp: , Rfl:  .  vitamin B-12 (CYANOCOBALAMIN) 1000 MCG tablet, Take 1,000 mcg by mouth daily., Disp: , Rfl: :  :  Allergies  Allergen Reactions  . Bee Venom Nausea Only and Other (See Comments)    Wasp and yellow jackets  :  Family History  Problem Relation Age of Onset  . Heart attack Mother   . Stroke Father   . Stroke Sister   :  Social History   Socioeconomic  History  . Marital status: Married    Spouse name: Not on file  . Number of children: Not on file  . Years of education: Not on file  . Highest education level: Not on file  Occupational History  . Occupation: retired  Tobacco Use  . Smoking status: Former Smoker    Quit date: 12/09/1959    Years since quitting: 60.7  . Smokeless tobacco: Never Used  Vaping Use  . Vaping Use: Never used  Substance and Sexual Activity  . Alcohol use: Yes  Alcohol/week: 14.0 standard drinks    Types: 7 Shots of liquor, 7 Cans of beer per week  . Drug use: No  . Sexual activity: Not on file  Other Topics Concern  . Not on file  Social History Narrative   Tobacco use cigarettes : Former smoker, Quit in year 1955   Occupation : retired/  married   Social Determinants of Radio broadcast assistant Strain:   . Difficulty of Paying Living Expenses: Not on file  Food Insecurity:   . Worried About Charity fundraiser in the Last Year: Not on file  . Ran Out of Food in the Last Year: Not on file  Transportation Needs:   . Lack of Transportation (Medical): Not on file  . Lack of Transportation (Non-Medical): Not on file  Physical Activity:   . Days of Exercise per Week: Not on file  . Minutes of Exercise per Session: Not on file  Stress:   . Feeling of Stress : Not on file  Social Connections:   . Frequency of Communication with Friends and Family: Not on file  . Frequency of Social Gatherings with Friends and Family: Not on file  . Attends Religious Services: Not on file  . Active Member of Clubs or Organizations: Not on file  . Attends Archivist Meetings: Not on file  . Marital Status: Not on file  Intimate Partner Violence:   . Fear of Current or Ex-Partner: Not on file  . Emotionally Abused: Not on file  . Physically Abused: Not on file  . Sexually Abused: Not on file  :  Review of Systems  Constitutional: Positive for malaise/fatigue.  HENT: Negative.   Eyes:  Negative.   Respiratory: Positive for shortness of breath.   Cardiovascular: Positive for palpitations.  Gastrointestinal: Positive for blood in stool and melena.  Genitourinary: Negative.   Musculoskeletal: Negative.   Skin: Negative.   Neurological: Positive for dizziness.  Endo/Heme/Allergies: Negative.   Psychiatric/Behavioral: Negative.      Exam:  This is a elderly, thin white male in no obvious distress.  His vital signs are temperature of 98.2.  Pulse 75.  Blood pressure 125/61.  He says his weight is about 152 pounds.  Head and neck exam shows no scleral icterus.  He has pale conjunctiva.  There is no oral lesions.  He has no adenopathy in the neck.  Thyroid is nonpalpable.  Lungs are clear bilaterally.  Cardiac exam regular rate and rhythm.  He has occasional extra beat.  He does have a 3/6 systolic ejection murmur which sounds like aortic stenosis.  Abdomen is soft.  He has no guarding or rebound tenderness.  There is no palpable liver or spleen tip.  I cannot palpate an abdominal mass.  Back exam shows no tenderness over the spine, ribs or hips.  Extremities shows no clubbing, cyanosis or edema.  Has some symmetrical muscle atrophy in upper and lower extremities.  He has some osteoarthritic changes in his joints.  Neurological exam shows no focal neurological deficits.    _0 @   Recent Labs    08/23/20 0755 08/25/20 1216  WBC 11.6* 8.3  HGB 8.1* 8.1*  HCT 25.8* 25.9*  PLT 192 143*   Recent Labs    08/23/20 0755 08/25/20 1216  NA 131* 136  K 3.1* 4.4  CL 99 102  CO2 22 26  GLUCOSE 164* 116*  BUN 20 23  CREATININE 1.37* 1.38*  CALCIUM 8.5* 9.0  Blood smear review: None  Pathology: As above    Assessment and Plan: Roy David is a very nice 86 year old white male.  He has locally advanced gastric cancer.  He, from my perspective is not a surgical candidate.  It appears that he does have disease outside of the stomach.  There is no obvious infiltration  into the liver.  There is no metastatic disease to his chest or to his bones.  I probably would have to say that he likely has stage III disease.  His big problem clearly is that he is bleeding.  I am sure that he is still bleeding.  His hemoglobin is only 8.1.  We will have to transfuse him.  We will have to see about giving him iron.  I think the best way to treat him right now is radiation therapy.  I do not think he would be a candidate for Xeloda to try to help as a radiation sensitizer.  I do still think he would tolerate Xeloda all that well given his performance status.  I spoke with Dr. Sondra Come of Radiation Oncology.  He actually treated Roy David wife.  It sounds like radiation therapy will probably be 2-3 weeks.  Again if we can just get the bleeding stopped, this will clearly help Roy David and his performance status.  Given the fact that his tumor is HER-2 positive, we probably can actually treat this with nonchemotherapy.  I think that a clinical trial was reported that showed that immunotherapy along with Herceptin was a highly effective for patients to have HER-2 positive gastric cancer.  We could certainly consider this for Roy David.  It would be nice if his performance status was below bit better before we embark upon any therapy.  Again we will transfuse him with 2 units of blood tomorrow.  I spent about an hour or so with Ms. and Roy David.  I went out to the waiting area to talk to their adult children.  Our goal clearly is his quality of life.  I will plan to get him back to see [place] in about 2 or 3 weeks.  Again, he will begin radiation therapy and I would think if there are any problems, radiation therapy will be able to handle this.  If not, we will be more than happy to get him in sooner.

## 2020-08-25 NOTE — Progress Notes (Signed)
Initial RN Navigator Patient Visit  Name: Roy David Date of Referral : hospital follow up Diagnosis: Gastric Cancer  Met with patient prior to their visit with MD. Gave patient "Your Patient Navigator" handout which explains my role, areas in which I am able to help, and all the contact information for myself and the office. Also gave patient MD and Navigator business card. Reviewed with patient the general overview of expected course after initial diagnosis and time frame for all steps to be completed.  New patient packet given to patient which includes: orientation to office and staff; campus directory; education on My Chart and Advance Directives; and patient centered education on Gastric Cancer  Dr Ennever will complete note later today and I will follow up on plan tomorrow.   Patient understands all follow up procedures and expectations. They have my number to reach out for any further clarification or additional needs.  

## 2020-08-26 ENCOUNTER — Other Ambulatory Visit: Payer: Self-pay | Admitting: Hematology & Oncology

## 2020-08-26 ENCOUNTER — Encounter: Payer: Self-pay | Admitting: *Deleted

## 2020-08-26 ENCOUNTER — Telehealth: Payer: Self-pay | Admitting: Hematology & Oncology

## 2020-08-26 ENCOUNTER — Inpatient Hospital Stay: Payer: Medicare Other

## 2020-08-26 VITALS — BP 132/78 | HR 82 | Temp 98.0°F | Resp 16

## 2020-08-26 DIAGNOSIS — R002 Palpitations: Secondary | ICD-10-CM | POA: Diagnosis not present

## 2020-08-26 DIAGNOSIS — Z87891 Personal history of nicotine dependence: Secondary | ICD-10-CM | POA: Diagnosis not present

## 2020-08-26 DIAGNOSIS — C160 Malignant neoplasm of cardia: Secondary | ICD-10-CM

## 2020-08-26 DIAGNOSIS — R42 Dizziness and giddiness: Secondary | ICD-10-CM | POA: Diagnosis not present

## 2020-08-26 DIAGNOSIS — C162 Malignant neoplasm of body of stomach: Secondary | ICD-10-CM

## 2020-08-26 DIAGNOSIS — Z79899 Other long term (current) drug therapy: Secondary | ICD-10-CM | POA: Diagnosis not present

## 2020-08-26 DIAGNOSIS — D500 Iron deficiency anemia secondary to blood loss (chronic): Secondary | ICD-10-CM

## 2020-08-26 DIAGNOSIS — I10 Essential (primary) hypertension: Secondary | ICD-10-CM | POA: Diagnosis not present

## 2020-08-26 DIAGNOSIS — C168 Malignant neoplasm of overlapping sites of stomach: Secondary | ICD-10-CM | POA: Diagnosis not present

## 2020-08-26 DIAGNOSIS — Z8249 Family history of ischemic heart disease and other diseases of the circulatory system: Secondary | ICD-10-CM | POA: Diagnosis not present

## 2020-08-26 DIAGNOSIS — M6250 Muscle wasting and atrophy, not elsewhere classified, unspecified site: Secondary | ICD-10-CM | POA: Diagnosis not present

## 2020-08-26 DIAGNOSIS — E7800 Pure hypercholesterolemia, unspecified: Secondary | ICD-10-CM | POA: Diagnosis not present

## 2020-08-26 DIAGNOSIS — R0602 Shortness of breath: Secondary | ICD-10-CM | POA: Diagnosis not present

## 2020-08-26 DIAGNOSIS — R5383 Other fatigue: Secondary | ICD-10-CM | POA: Diagnosis not present

## 2020-08-26 DIAGNOSIS — K921 Melena: Secondary | ICD-10-CM | POA: Diagnosis not present

## 2020-08-26 DIAGNOSIS — Z7289 Other problems related to lifestyle: Secondary | ICD-10-CM | POA: Diagnosis not present

## 2020-08-26 DIAGNOSIS — M1990 Unspecified osteoarthritis, unspecified site: Secondary | ICD-10-CM | POA: Diagnosis not present

## 2020-08-26 DIAGNOSIS — I480 Paroxysmal atrial fibrillation: Secondary | ICD-10-CM | POA: Diagnosis not present

## 2020-08-26 DIAGNOSIS — Z823 Family history of stroke: Secondary | ICD-10-CM | POA: Diagnosis not present

## 2020-08-26 LAB — IRON AND TIBC
Iron: 45 ug/dL (ref 42–163)
Saturation Ratios: 14 % — ABNORMAL LOW (ref 20–55)
TIBC: 324 ug/dL (ref 202–409)
UIBC: 278 ug/dL (ref 117–376)

## 2020-08-26 LAB — FERRITIN: Ferritin: 38 ng/mL (ref 24–336)

## 2020-08-26 LAB — CEA (IN HOUSE-CHCC): CEA (CHCC-In House): 4.05 ng/mL (ref 0.00–5.00)

## 2020-08-26 LAB — LACTATE DEHYDROGENASE: LDH: 339 U/L — ABNORMAL HIGH (ref 98–192)

## 2020-08-26 MED ORDER — SODIUM CHLORIDE 0.9 % IV SOLN: 510.0000 mg | Freq: Once | INTRAVENOUS | Status: DC

## 2020-08-26 MED ORDER — SODIUM CHLORIDE 0.9 % IV SOLN
200.0000 mg | Freq: Once | INTRAVENOUS | Status: AC
  Administered 2020-08-26: 200 mg via INTRAVENOUS
  Filled 2020-08-26: qty 200

## 2020-08-26 MED ORDER — SODIUM CHLORIDE 0.9 % IV SOLN
INTRAVENOUS | Status: DC
  Filled 2020-08-26: qty 250

## 2020-08-26 NOTE — Progress Notes (Signed)
Patient here for a consult with Dr. Sondra Come.  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 Follow-up  : I have stomach cancer.  HPI: Mr. Kuhl is a very nice 86 year old white male.  He actually is the husband of one of my patients.  He does have health issues.  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  He apparently has had bleeding in the past.  He has had colonoscopies.  Over the past couple weeks, he has gotten weaker.  He has not lost weight.  He has had no nausea or vomiting.  He apparently has been eating okay according to his wife.  He got to the point where he passed out.  His wife says she found him on the floor.  She said that she "brought him back."  EMS was called.  [facility]  He was taken there on 08/20/2020.  When he was brought in, his hemoglobin was 5.3.  Platelet count was 263K, his white cell count was 13.3.  His stool was heme positive.  His electrolytes showed BUN of 38 creatinine 1.31.  His albumin was 2.7.  His calcium was 8.6.  He underwent a CT scan of the body on 08/21/2020.  This showed diffuse gastric thickening.  This was felt to be consistent with a gastric neoplasm.  There was soft tissue infiltrating the hepatic gastric recess.  There was some nodularity throughout the hepatogastric ligament.  There is some nodularity along the short gastric arteries.  This all appears to be suspicious for locally advanced disease.  He underwent a upper endoscopy on 08/21/2020.  This, showed a diffusely infiltrative process in the stomach.  He had oozing from this area.  He had a malignant appearing stricture in the lower esophagus.  Biopsies were taken.  The  pathology report ([id number]) showed a poorly differentiated adenocarcinoma.  Thankfully, the tumor is HER-2  POSITIVE by FISH.  He was transfused.  Is put on some oral iron.  No iron studies were taken.  We were notified of for about his situation.  His wife wanted him to come to the [place] because she has seen his for about 20 years.  He comes in a wheelchair.  I must say that overall, his performance status is probably ECOG 2 at best.  He has had no obvious bleeding.  I think there is still some melena.  He has had no chest pain.  He has had no cough or shortness of breath.  There is no history of tobacco use.  He said he smoked but stopped when he was 86 years old.  There is no significant alcohol use.  He is stopped these anticoagulation.  He is currently on baby aspirin.  I told him to stop the baby aspirin.  There is no significant family history for cancer.         Past Medical History:  Diagnosis Date  . Arthritis   . Asthma    followed at [facility], stable on Asmanex  . Diverticulosis   . Dysrhythmia   . Gastric cancer ([place]) 08/25/2020  . H/O seasonal  allergies    Dr [name]  . Hypercholesteremia   . Hypertension   . Hypochromic-microcytic anemia   . Microcytic anemia   . Paroxysmal atrial fibrillation ([place]) 03/06/2016   on Xarelto  . Prostate cancer [facility])    12/10- Dr Dahlstedt/Dr Valere Dross  . Thyroid disease    hypothyroidism       :  Past Surgical History:  Procedure Laterality Date  . APPENDECTOMY    . BIOPSY  08/21/2020   Procedure: BIOPSY;  Surgeon: Ronnette Juniper, MD;  Location: [place];  Service: Gastroenterology;;  . COLONOSCOPY N/A 01/05/2020   Procedure: COLONOSCOPY;  Surgeon: Clarene Essex, MD;  Location: [place];  Service: Endoscopy;  Laterality: N/A;  . EP IMPLANTABLE DEVICE N/A 11/29/2015   Procedure: Loop Recorder Insertion;  Surgeon: Sanda Klein, MD;  Location: [place] CV LAB;   Service: Cardiovascular;  Laterality: N/A;  . ESOPHAGOGASTRODUODENOSCOPY (EGD) WITH PROPOFOL N/A 08/21/2020   Procedure: ESOPHAGOGASTRODUODENOSCOPY (EGD) WITH PROPOFOL;  Surgeon: Ronnette Juniper, MD;  Location: [place];  Service: Gastroenterology;  Laterality: N/A;  . HERNIA REPAIR    . RETINAL DETACHMENT SURGERY    :         Assessment and Plan: Mr. Weier is a very nice 86 year old white male.  He has locally advanced gastric cancer.  He, from my perspective is not a surgical candidate.  It appears that he does have disease outside of the stomach.  There is no obvious infiltration into the liver.  There is no metastatic disease to his chest or to his bones.  I probably would have to say that he likely has stage III disease.  His big problem clearly is that he is bleeding.  I am sure that he is still bleeding.  His hemoglobin is only 8.1.  We will have to transfuse him.  We will have to see about giving him iron.  I think the best way to treat him right now is radiation therapy.  I do not think he would be a candidate for Xeloda to try to help as a radiation sensitizer.  I do still think he would tolerate Xeloda all that well given his performance status.  I spoke with Dr. Sondra Come of Radiation Oncology.  He actually treated Mr. Schaab wife.  It sounds like radiation therapy will probably be 2-3 weeks.  Again if we can just get the bleeding stopped, this will clearly help Mr. Laperle and his performance status.  Given the fact that his tumor is HER-2 positive, we probably can actually treat this with nonchemotherapy.  I think that a clinical trial was reported that showed that immunotherapy along with Herceptin was a highly effective for patients to have HER-2 positive gastric cancer.  We could certainly consider this for Mr. Bolger.  It would be nice if his performance status was below bit better before we embark upon any therapy.  Again we will transfuse him with 2 units of blood  tomorrow.  I spent about an hour or so with Ms. and Mr. Pemble.  I went out to the waiting area to talk to their adult children.  Our goal clearly is his quality of life.  I will plan to get him back to see [place] in about 2 or 3 weeks.  Again, he will begin radiation therapy and I would think if there are any problems, radiation therapy will be able to handle this.  If not, we will be more than happy to get him in sooner.  Electronically signed by Volanda Napoleon, MD at 08/25/2020 4:53 PM   Past/Anticipated interventions by medical oncology, if any: none  Weight changes, if any: does not think so  Bowel/Bladder complaints, if any: no  Nausea / Vomiting, if any: no  Pain issues, if any: none  Any blood per rectum:  none  SAFETY ISSUES:  Prior radiation? no  Pacemaker/ICD? no  Possible current pregnancy? n/a  Is the patient on methotrexate? no  Current Complaints/Details: Patient is accompanied by  His wife. Patient uses a walker at home with assistance. Patient's wife encouraged to call the [facility] for any needs that can support them.  BP 113/61 (BP Location: Left Arm, Patient Position: Sitting)   Pulse 68   Temp 97.9 F (36.6 C) (Oral)   Resp 18   Ht 5' 8.5" (1.74 m)   SpO2 100%   BMI 22.78 kg/m   Wt Readings from Last 3 Encounters:  08/25/20 152 lb (68.9 kg)  08/21/20 152 lb (68.9 kg)  03/07/20 155 lb (70.3 kg)

## 2020-08-26 NOTE — Progress Notes (Signed)
Treatment plan for patient is radiation. Referral was placed on 08/25/2020 and patient has already been scheduled to see Dr Sondra Come on 08/30/2020. He is back in the office today to receive 2 units PRBC.   Spoke with the patient and wife who is also present for the transfusion. They are both aware of the new patient appointment with radiation on Tuesday. They have no questions or concerns at this time, but they know to call the office if they have any needs.   Oncology Nurse Navigator Documentation  Oncology Nurse Navigator Flowsheets 08/26/2020  Abnormal Finding Date -  Confirmed Diagnosis Date -  Diagnosis Status -  Planned Course of Treatment Radiation  Phase of Treatment Radiation  Navigator Follow Up Date: 08/30/2020  Navigator Follow Up Reason: Radiation  Navigator Location [facility]  Referral Date to RadOnc/MedOnc -  Navigator Encounter Type Treatment;Telephone  Patient Visit Type MedOnc  Treatment Phase Pre-Tx/Tx Discussion  Barriers/Navigation Needs Education;Morbidities/Frailty;Family Concerns  Education -  Interventions Psycho-Social Support  Acuity Level 3-Moderate Needs (3-4 Barriers Identified)  Education Method -  Support Groups/Services Friends and Family  Time Spent with Patient 30

## 2020-08-26 NOTE — Patient Instructions (Signed)

## 2020-08-26 NOTE — Telephone Encounter (Signed)
Appointments scheduled calendar printed per 9/16 los °

## 2020-08-26 NOTE — Addendum Note (Signed)
Addended by: Burney Gauze R on: 08/26/2020 09:46 AM   Modules accepted: Orders

## 2020-08-27 LAB — TYPE AND SCREEN
ABO/RH(D): A POS
Antibody Screen: NEGATIVE
Unit division: 0
Unit division: 0

## 2020-08-27 LAB — BPAM RBC
Blood Product Expiration Date: 202110052359
Blood Product Expiration Date: 202110062359
ISSUE DATE / TIME: 202109170755
ISSUE DATE / TIME: 202109170755
Unit Type and Rh: 6200
Unit Type and Rh: 6200

## 2020-08-29 NOTE — Progress Notes (Signed)
Radiation Oncology         [phone] ________________________________  Initial Outpatient Consultation  Name: Roy David MRN: [id number]  Date: 08/30/2020  DOB: 1930-03-09  JO:ACZYSAYT, L.Marlou Sa, MD  Volanda Napoleon, MD   REFERRING PHYSICIAN: Volanda Napoleon, MD  DIAGNOSIS: The encounter diagnosis was Malignant neoplasm of cardia of stomach ([place]).  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 male who is seen as a courtesy of Dr. Marin Olp for an opinion concerning radiation therapy as part of management for his recently diagnosed gastric cancer. Today, he is accompanied by wife. The patient presented to the ED on 08/21/2020 with complaints of syncope, weakness, and progressively worsening lightheadedness. CT scan of head at that time showed a small left frontal subcutaneous scalp hematoma and chronic atrophy with small vessel ischemic changes in the white matter. However, there was no acute intracranial abnormality. Lab work was also done and showed a hemoglobin of 5.3. Hemoccult was positive. Given those findings, the patient was admitted to the hospital with GI consult.  The patient was seen in consultation with Dr. Therisa Doyne, gastroenterologist, later that day. At that time, it was recommended that he proceed with a second unit of PRBC transfusion followed by PPI IV twice daily and endoscopy. EGD was performed and showed malignant-appearing esophageal stenosis and likely malignancy proximal gastric tumor. Biopsy was performed and revealed poorly differentiated adenocarcinoma. There were also fragments of gastric mucosa with interstitial metaplasia. Her2 was positive.  CT scan of chest, abdomen, and pelvis was performed on 08/21/2020 and showed diffuse gastric thickening with mural stratification. It also showed soft tissue infiltrating the hepatic gastric recess where there was a focal extending from the wall of the  stomach approximately 12 x 8 mm. Findings were concerning for early tumor infiltration beyond the wall of the stomach. Hazy nodularity was seen throughout the hepatic gastric ligament in addition to some nodularity along the short gastrics along the greater curvature of the stomach that was also suspicious for extra gastric involvement. The nodules tracked along the gastrosplenic ligament where there was general hazy nodularity seen. Cystic area in the pancreas was stable. Additionally, there were areas of low attenuation in the peripheral spleen with wedge-shaped characteristics that suggested small splenic infarcts. More geographic area of low attenuation along the lateral margin of the spleen measured approximately 12 mm, possibly representing a small infract versus small splenic lesion/metastasis. Finally, there were scattered tree-in-bud opacities in the posterior right upper lobe that may have been infectious or inflammatory, a 4.2 cm ascending aortic dilation that was compatible with mild aneurysmal dilation, emphysema, aortic atherosclerosis, and prostate brachytherapy seeds in place.  The patient was stabilized and discharged home on 08/23/2020 with referral to Dr. Marin Olp. He was seen in consultation on 08/25/2020, during which time he was not seen to be a surgical candidate. It was recommended that he proceed with an additional blood transfusion and radiation therapy. Immunotherapy along with Herceptin was found to be highly effective for patients with Her-2 positive gastric cancer, thus it was discussed.  PREVIOUS RADIATION THERAPY: Yes. Prostate cancer in December of 2020 treated with brachytherapy. He was under the care of Dr. Diona Fanti and Dr. Valere Dross.  PAST MEDICAL HISTORY:  Past Medical History:  Diagnosis Date  . Arthritis   . Asthma    followed at [facility], stable on Asmanex  . Diverticulosis   . Dysrhythmia   . Gastric cancer ([place]) 08/25/2020  .  Goals of care, counseling/discussion  08/25/2020  . H/O seasonal allergies    Dr [name]  . Hypercholesteremia   . Hypertension   . Hypochromic-microcytic anemia   . Microcytic anemia   . Paroxysmal atrial fibrillation ([place]) 03/06/2016   on Xarelto  . Prostate cancer [facility])    12/10- Dr Dahlstedt/Dr Valere Dross  . Thyroid disease    hypothyroidism    PAST SURGICAL HISTORY: Past Surgical History:  Procedure Laterality Date  . APPENDECTOMY    . BIOPSY  08/21/2020   Procedure: BIOPSY;  Surgeon: Ronnette Juniper, MD;  Location: [place];  Service: Gastroenterology;;  . COLONOSCOPY N/A 01/05/2020   Procedure: COLONOSCOPY;  Surgeon: Clarene Essex, MD;  Location: [place];  Service: Endoscopy;  Laterality: N/A;  . EP IMPLANTABLE DEVICE N/A 11/29/2015   Procedure: Loop Recorder Insertion;  Surgeon: Sanda Klein, MD;  Location: [place] [facility];  Service: Cardiovascular;  Laterality: N/A;  . ESOPHAGOGASTRODUODENOSCOPY (EGD) WITH PROPOFOL N/A 08/21/2020   Procedure: ESOPHAGOGASTRODUODENOSCOPY (EGD) WITH PROPOFOL;  Surgeon: Ronnette Juniper, MD;  Location: [place];  Service: Gastroenterology;  Laterality: N/A;  . HERNIA REPAIR    . RETINAL DETACHMENT SURGERY      FAMILY HISTORY:  Family History  Problem Relation Age of Onset  . Heart attack Mother   . Stroke Father   . Stroke Sister     SOCIAL HISTORY:  Social History   Tobacco Use  . Smoking status: Former Smoker    Quit date: 12/09/1959    Years since quitting: 60.7  . Smokeless tobacco: Never Used  Vaping Use  . Vaping Use: Never used  Substance Use Topics  . Alcohol use: Yes    Alcohol/week: 14.0 standard drinks    Types: 7 Shots of liquor, 7 Cans of beer per week  . Drug use: No    ALLERGIES:  Allergies  Allergen Reactions  . Bee Venom Nausea Only and Other (See Comments)    Wasp and yellow jackets    MEDICATIONS:  Current Outpatient Medications  Medication Sig Dispense Refill  . aspirin EC 81 MG tablet Take 81 mg by mouth daily.    .  cholecalciferol (VITAMIN D) 1000 units tablet Take 1,000 Units by mouth daily.     . Coenzyme Q10 200 MG TABS Take 200 mg by mouth daily.     Marland Kitchen doxycycline (MONODOX) 100 MG capsule Take 100 mg by mouth 2 (two) times daily as needed (asthma flare up).     . EPINEPHrine 0.3 mg/0.3 mL IJ SOAJ injection Inject 0.3 mg into the muscle once.    Marland Kitchen esomeprazole (NEXIUM) 40 MG capsule Take 1 capsule (40 mg total) by mouth daily. (Patient taking differently: Take 80 mg by mouth daily. ) 30 capsule 1  . ezetimibe-simvastatin (VYTORIN) 10-20 MG per tablet Take 1 tablet by mouth daily.    Marland Kitchen gabapentin (NEURONTIN) 300 MG capsule Take 300 mg by mouth 2 (two) times daily.     Marland Kitchen GLUCOSAMINE-CHONDROITIN PO Take 1 tablet by mouth daily.     Marland Kitchen levothyroxine (SYNTHROID, LEVOTHROID) 100 MCG tablet Take 100 mcg by mouth daily.     . mometasone (ASMANEX 60 METERED DOSES) 220 MCG/INH inhaler Inhale 1 Inhaler into the lungs daily as needed (asthma flare up).     . predniSONE (DELTASONE) 20 MG tablet Take 20 mg by mouth 2 (two) times daily as needed (asthma flare up).     . triamcinolone cream (KENALOG) 0.1 % Apply 1 application topically 2 (two) times daily  as needed (itching).     . ferrous sulfate 325 (65 FE) MG tablet Take 325 mg by mouth daily with breakfast. (Patient not taking: Reported on 08/30/2020)    . ondansetron (ZOFRAN) 4 MG tablet Take 1 tablet (4 mg total) by mouth every 8 (eight) hours as needed for nausea or vomiting. 20 tablet 1  . vitamin B-12 (CYANOCOBALAMIN) 1000 MCG tablet Take 1,000 mcg by mouth daily. (Patient not taking: Reported on 08/30/2020)     No current facility-administered medications for this encounter.    REVIEW OF SYSTEMS:  A 10+ POINT REVIEW OF SYSTEMS WAS OBTAINED including neurology, dermatology, psychiatry, cardiac, respiratory, lymph, extremities, GI, GU, musculoskeletal, constitutional, reproductive, HEENT.  He denies any nausea or abdominal pain.  He has noticed tarry stools  recently.  He denies any dizziness with standing at this time   PHYSICAL EXAM:  height is 5' 8.5" (1.74 m). His oral temperature is 97.9 F (36.6 C). His blood pressure is 113/61 and his pulse is 68. His respiration is 18 and oxygen saturation is 100%.   General: Alert and oriented, in no acute distress HEENT: Head is normocephalic. Extraocular movements are intact. Oropharynx is clear. Neck: Neck is supple, no palpable cervical or supraclavicular lymphadenopathy. Heart: Regular in rate and rhythm with no murmurs, rubs, or gallops. Chest: Clear to auscultation bilaterally, with no rhonchi, wheezes, or rales. Abdomen: Soft, nontender, nondistended, with no rigidity or guarding. Extremities: No cyanosis or edema. Lymphatics: see Neck Exam Skin: No concerning lesions. Musculoskeletal: symmetric strength and muscle tone throughout. Neurologic: Cranial nerves II through XII are grossly intact. No obvious focalities. Speech is fluent. Coordination is intact. Psychiatric: Judgment and insight are intact. Affect is appropriate.   ECOG = 2  0 - Asymptomatic (Fully active, able to carry on all predisease activities without restriction)  1 - Symptomatic but completely ambulatory (Restricted in physically strenuous activity but ambulatory and able to carry out work of a light or sedentary nature. For example, light housework, office work)  2 - Symptomatic, <50% in bed during the day (Ambulatory and capable of all self care but unable to carry out any work activities. Up and about more than 50% of waking hours)  3 - Symptomatic, >50% in bed, but not bedbound (Capable of only limited self-care, confined to bed or chair 50% or more of waking hours)  4 - Bedbound (Completely disabled. Cannot carry on any self-care. Totally confined to bed or chair)  5 - Death   Roy David MM, Creech RH, Tormey DC, et al. [phone]). "Toxicity and response criteria of the [facility]". [place] Oncol.  5 (6): 649-55  LABORATORY DATA:  Lab Results  Component Value Date   WBC 8.3 08/25/2020   HGB 8.1 (L) 08/25/2020   HCT 25.9 (L) 08/25/2020   MCV 95.6 08/25/2020   PLT 143 (L) 08/25/2020   NEUTROABS 6.6 08/25/2020   Lab Results  Component Value Date   NA 136 08/25/2020   K 4.4 08/25/2020   CL 102 08/25/2020   CO2 26 08/25/2020   GLUCOSE 116 (H) 08/25/2020   CREATININE 1.38 (H) 08/25/2020   CALCIUM 9.0 08/25/2020      RADIOGRAPHY: CT Head Wo Contrast  Result Date: 08/21/2020 CLINICAL DATA:  Dizziness for 1 week with syncopal episode this morning. EXAM: CT HEAD WITHOUT CONTRAST TECHNIQUE: Contiguous axial images were obtained from the base of the skull through the vertex without intravenous contrast. COMPARISON:  None. FINDINGS: Brain: No evidence of  acute infarction, hemorrhage, hydrocephalus, extra-axial collection or mass lesion/mass effect. Diffuse cerebral atrophy. Patchy low-attenuation changes in the white matter consistent with small vessel ischemic changes. Vascular: Intracranial arterial vascular calcifications are present. Skull: The calvarium appears intact. Small left frontal subcutaneous scalp hematoma. Sinuses/Orbits: Mucosal thickening in the paranasal sinuses. No acute air-fluid levels. Mastoid air cells are clear. Previous bilateral ocular banding procedures. Other: Congenital nonunion of the posterior arch of C1. IMPRESSION: 1. No acute intracranial abnormalities. 2. Chronic atrophy and small vessel ischemic changes in the white matter. 3. Small left frontal subcutaneous scalp hematoma. Electronically Signed   By: Lucienne Capers M.D.   On: 08/21/2020 03:54   CT CHEST ABDOMEN PELVIS W CONTRAST  Result Date: 08/21/2020 CLINICAL DATA:  Gastric mass in GI bleeding EXAM: CT CHEST, ABDOMEN, AND PELVIS WITH CONTRAST TECHNIQUE: Multidetector CT imaging of the chest, abdomen and pelvis was performed following the standard protocol during bolus administration of intravenous  contrast. CONTRAST:  170m OMNIPAQUE IOHEXOL 300 MG/ML  SOLN COMPARISON:  12/31/2019 FINDINGS: CT CHEST FINDINGS Cardiovascular: Calcified atheromatous plaque in the thoracic aorta. 4.2 cm ascending aortic dilation, no comparison of this area. Central pulmonary vasculature, unremarkable on venous phase assessment. Calcifications of the aortic valve and mitral annular calcification. Calcified coronary artery disease.  No pericardial fluid. Mediastinum/Nodes: No adenopathy in the chest. Esophagus mildly patulous. Thickening of the gastroesophageal junction. Lungs/Pleura: Background pulmonary emphysema. Mild subpleural reticulation. Scattered tree-in-bud opacities, for instance on image 47 of series 4 in the posterior RIGHT upper lobe. Biapical pleural and parenchymal scarring. Calcified nodule in the RIGHT mid chest is compatible with granuloma. Airways are patent. No consolidation. No pleural effusion. Musculoskeletal: No acute musculoskeletal process. Spinal degenerative changes in the thoracic spine. CT ABDOMEN PELVIS FINDINGS Hepatobiliary: Low-density hepatic lesion in the RIGHT hepatic lobe is stable compared to the prior study, well-circumscribed and showing water density compatible with small cyst. Liver contours are smooth. No suspicious hepatic lesion. Portal vein is patent. Hepatic veins are patent. No pericholecystic stranding.  No biliary duct distension. Pancreas: No peripancreatic stranding. Cystic area in the pancreatic body 2.1 x 1.7 cm, previously 2.1 x 1.6 cm for Spleen: Areas of low attenuation in the peripheral spleen with wedge-shaped characteristics suggesting small splenic infarcts. More geographic area of low attenuation seen along the lateral margin of the spleen measuring approximately 12 mm. Adrenals/Urinary Tract: Adrenal glands are normal. Symmetric renal enhancement. Cortical scarring of bilateral kidneys. Mild fullness of the proximal RIGHT ureter is stable, no hydronephrosis.  Stomach/Bowel: Stomach is under distended limiting assessment. No discrete mass is noted. There is diffuse gastric thickening with mural stratification. Significance uncertain though certainly could be seen in the setting of diffuse gastric neoplasm or with gastritis. Mild perigastric stranding may reflect changes of recent intervention Signs of colonic diverticulosis.  No sign of diverticulitis. Vascular/Lymphatic: Calcified and noncalcified atheromatous plaque of the abdominal aorta, extending into the iliac vessels. Small lymph nodes throughout the hepatic gastric ligament without pathologic enlargement in the 4-5 mm range. However, in the coronal plane there is diffuse nodular and hazy soft tissue stranding throughout the hepatic gastric ligament. Early local tumor infiltration is considered. No retroperitoneal adenopathy. No pelvic lymphadenopathy. Reproductive: Prostate with brachytherapy seeds in place. Other: No ascites. Musculoskeletal: No acute musculoskeletal finding. No destructive bone process. IMPRESSION: 1. Diffuse gastric thickening with mural stratification in this patient with known gastric neoplasm. Superimposed gastritis could accentuate this appearance. 2. Coronal images display soft tissue infiltrating the hepatic gastric recess best seen  on image 30 of series 94 where there is a focus extending from the wall of the stomach approximately 12 x 8 mm. Findings are concerning for early tumor infiltration beyond the wall the stomach. Hazy nodularity can be seen throughout the hepatic gastric ligament best seen on image 24 of series 6. Also some nodularity along short gastrics along the greater curvature of the stomach, also suspicious for extra gastric involvement. These nodules track along the gastrosplenic ligament there is general hazy nodularity in this area. 3. Stable cystic area in the pancreas, attention on follow-up. Dedicated MRI/MRCP could be considered as clinically indicated for further  assessment in 6 months. 4. Areas of low attenuation in the peripheral spleen with wedge-shaped characteristics suggesting small splenic infarcts. More geographic area of low attenuation along the lateral margin of the spleen measuring approximately 12 mm. These may represent a small infarct. Consider close follow-up to exclude the possibility of small splenic lesion/metastasis though this would be unusual. MRI could be potentially helpful in differentiating these considerations. 5. Scattered tree-in-bud opacities in the posterior RIGHT upper lobe may reflect an infectious or inflammatory process. Recommend attention on follow-up. 6. 4.2 cm ascending aortic dilation, compatible with mild aneurysmal dilation. Recommend annual imaging followup by CTA or MRA. This recommendation follows 2010 ACCF/AHA/AATS/ACR/ASA/SCA/SCAI/SIR/STS/SVM Guidelines for the Diagnosis and Management of Patients with Thoracic Aortic Disease. Circulation. 2010; 121: O536-U440. Aortic aneurysm NOS (ICD10-I71.9) 7. Prostate with brachytherapy seeds in place. 8. Emphysema and aortic atherosclerosis. Aortic Atherosclerosis (ICD10-I70.0) and Emphysema (ICD10-J43.9). Electronically Signed   By: Zetta Bills M.D.   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  Today, I talked to the patient and wife about the findings and work-up thus far.  We discussed the natural history of Her2 positive gastric cancer and general treatment, highlighting the role of radiotherapy in the management.  We discussed the available radiation techniques, and focused on the details of logistics and delivery.  We reviewed the anticipated acute and late sequelae associated with radiation in this setting.  The patient was encouraged to ask  questions that I answered to the best of my ability.  A patient consent form was discussed and signed.  We retained a copy for our records.  The patient would like to proceed with radiation and will be scheduled for CT simulation.  PLAN: The patient is scheduled to undergo CT simulation later today.  Treatments to begin Thursday.  Anticipate 14 treatments directed at the gastric region.  He will undergo weekly CBCs during his radiation therapy to assess for any additional problems with bleeding which may require transfusion.  Total time spent in this encounter was 65 minutes which included reviewing the patient's most recent ED visit, hospitalization, CT scans, endoscopy, consultations, biopsy, pathology report, physical examination, and documentation.   ------------------------------------------------  Blair Promise, PhD, MD  This document serves as a record of services personally performed by Gery Pray, MD. It was created on his behalf by Clerance Lav, a trained medical scribe. The creation of this record is based on the scribe's personal observations and the provider's statements to them. This document has been checked and approved by the attending provider.

## 2020-08-30 ENCOUNTER — Encounter: Payer: Self-pay | Admitting: *Deleted

## 2020-08-30 ENCOUNTER — Encounter: Payer: Self-pay | Admitting: Radiation Oncology

## 2020-08-30 ENCOUNTER — Ambulatory Visit
Admission: RE | Admit: 2020-08-30 | Discharge: 2020-08-30 | Disposition: A | Discharge status: Home / Self Care | Payer: Medicare Other | Source: Ambulatory Visit | Attending: Radiation Oncology | Admitting: Radiation Oncology

## 2020-08-30 ENCOUNTER — Other Ambulatory Visit: Payer: Self-pay

## 2020-08-30 DIAGNOSIS — E7800 Pure hypercholesterolemia, unspecified: Secondary | ICD-10-CM | POA: Diagnosis not present

## 2020-08-30 DIAGNOSIS — Z79899 Other long term (current) drug therapy: Secondary | ICD-10-CM | POA: Diagnosis not present

## 2020-08-30 DIAGNOSIS — R55 Syncope and collapse: Secondary | ICD-10-CM | POA: Diagnosis not present

## 2020-08-30 DIAGNOSIS — J45909 Unspecified asthma, uncomplicated: Secondary | ICD-10-CM | POA: Insufficient documentation

## 2020-08-30 DIAGNOSIS — E039 Hypothyroidism, unspecified: Secondary | ICD-10-CM | POA: Diagnosis not present

## 2020-08-30 DIAGNOSIS — C160 Malignant neoplasm of cardia: Secondary | ICD-10-CM | POA: Insufficient documentation

## 2020-08-30 DIAGNOSIS — K222 Esophageal obstruction: Secondary | ICD-10-CM | POA: Diagnosis not present

## 2020-08-30 DIAGNOSIS — I480 Paroxysmal atrial fibrillation: Secondary | ICD-10-CM | POA: Diagnosis not present

## 2020-08-30 DIAGNOSIS — Z923 Personal history of irradiation: Secondary | ICD-10-CM | POA: Diagnosis not present

## 2020-08-30 DIAGNOSIS — Z8546 Personal history of malignant neoplasm of prostate: Secondary | ICD-10-CM | POA: Diagnosis not present

## 2020-08-30 DIAGNOSIS — Z510 Encounter for antineoplastic radiation therapy: Secondary | ICD-10-CM | POA: Diagnosis not present

## 2020-08-30 DIAGNOSIS — Z87891 Personal history of nicotine dependence: Secondary | ICD-10-CM | POA: Insufficient documentation

## 2020-08-30 DIAGNOSIS — R42 Dizziness and giddiness: Secondary | ICD-10-CM | POA: Diagnosis not present

## 2020-08-30 DIAGNOSIS — Z7952 Long term (current) use of systemic steroids: Secondary | ICD-10-CM | POA: Insufficient documentation

## 2020-08-30 DIAGNOSIS — K862 Cyst of pancreas: Secondary | ICD-10-CM | POA: Diagnosis not present

## 2020-08-30 DIAGNOSIS — I10 Essential (primary) hypertension: Secondary | ICD-10-CM | POA: Insufficient documentation

## 2020-08-30 DIAGNOSIS — J439 Emphysema, unspecified: Secondary | ICD-10-CM | POA: Insufficient documentation

## 2020-08-30 DIAGNOSIS — Z7982 Long term (current) use of aspirin: Secondary | ICD-10-CM | POA: Diagnosis not present

## 2020-08-30 DIAGNOSIS — C168 Malignant neoplasm of overlapping sites of stomach: Secondary | ICD-10-CM

## 2020-08-30 MED ORDER — ONDANSETRON HCL 4 MG PO TABS: 4.0000 mg | ORAL_TABLET | Freq: Three times a day (TID) | ORAL | 1 refills | Status: DC | PRN

## 2020-08-30 NOTE — Progress Notes (Signed)
Oncology Nurse Navigator Documentation  Oncology Nurse Navigator Flowsheets 08/30/2020  Abnormal Finding Date -  Confirmed Diagnosis Date -  Diagnosis Status -  Planned Course of Treatment Radiation  Phase of Treatment Radiation  Radiation Actual Start Date: 09/01/2020  Navigator Follow Up Date: 09/01/2020  Navigator Follow Up Reason: Radiation  Navigator Location [facility]  Referral Date to RadOnc/MedOnc -  Navigator Encounter Type Appt/Treatment Plan Review  Patient Visit Type MedOnc  Treatment Phase Pre-Tx/Tx Discussion  Barriers/Navigation Needs Education;Morbidities/Frailty;Family Concerns  Education -  Interventions None Required  Acuity Level 3-Moderate Needs (3-4 Barriers Identified)  Education Method -  Support Groups/Services Friends and Family  Time Spent with Patient 15

## 2020-08-31 DIAGNOSIS — C160 Malignant neoplasm of cardia: Secondary | ICD-10-CM | POA: Diagnosis not present

## 2020-08-31 DIAGNOSIS — Z510 Encounter for antineoplastic radiation therapy: Secondary | ICD-10-CM | POA: Diagnosis not present

## 2020-09-01 ENCOUNTER — Encounter: Payer: Self-pay | Admitting: *Deleted

## 2020-09-01 ENCOUNTER — Ambulatory Visit
Admission: RE | Admit: 2020-09-01 | Discharge: 2020-09-01 | Disposition: A | Discharge status: Home / Self Care | Payer: Medicare Other | Source: Ambulatory Visit | Attending: Radiation Oncology | Admitting: Radiation Oncology

## 2020-09-01 ENCOUNTER — Other Ambulatory Visit: Payer: Self-pay

## 2020-09-01 ENCOUNTER — Ambulatory Visit: Payer: Medicare Other | Admitting: Physician Assistant

## 2020-09-01 DIAGNOSIS — C160 Malignant neoplasm of cardia: Secondary | ICD-10-CM | POA: Diagnosis not present

## 2020-09-01 DIAGNOSIS — C168 Malignant neoplasm of overlapping sites of stomach: Secondary | ICD-10-CM

## 2020-09-01 DIAGNOSIS — Z510 Encounter for antineoplastic radiation therapy: Secondary | ICD-10-CM | POA: Diagnosis not present

## 2020-09-01 NOTE — Progress Notes (Signed)
Oncology Nurse Navigator Documentation  Oncology Nurse Navigator Flowsheets 09/01/2020  Abnormal Finding Date -  Confirmed Diagnosis Date -  Diagnosis Status -  Planned Course of Treatment -  Phase of Treatment Radiation  Radiation Actual Start Date: 09/01/2020  Radiation Expected End Date: 09/20/2020  Navigator Follow Up Date: 09/14/2020  Navigator Follow Up Reason: Follow-up Appointment  Navigator Location [facility]  Referral Date to RadOnc/MedOnc -  Navigator Encounter Type Appt/Treatment Plan Review  Treatment Initiated Date 09/01/2020  Patient Visit Type MedOnc  Treatment Phase Active Tx  Barriers/Navigation Needs Education;Morbidities/Frailty;Family Concerns  Education -  Interventions None Required  Acuity Level 3-Moderate Needs (3-4 Barriers Identified)  Education Method -  Support Groups/Services Friends and Family  Time Spent with Patient 15

## 2020-09-02 ENCOUNTER — Other Ambulatory Visit: Payer: Self-pay | Admitting: Radiation Therapy

## 2020-09-02 ENCOUNTER — Telehealth: Payer: Self-pay | Admitting: Internal Medicine

## 2020-09-02 ENCOUNTER — Ambulatory Visit
Admission: RE | Admit: 2020-09-02 | Discharge: 2020-09-02 | Disposition: A | Discharge status: Home / Self Care | Payer: Medicare Other | Source: Ambulatory Visit | Attending: Radiation Oncology | Admitting: Radiation Oncology

## 2020-09-02 ENCOUNTER — Telehealth: Payer: Self-pay

## 2020-09-02 DIAGNOSIS — Z510 Encounter for antineoplastic radiation therapy: Secondary | ICD-10-CM | POA: Diagnosis not present

## 2020-09-02 DIAGNOSIS — C160 Malignant neoplasm of cardia: Secondary | ICD-10-CM | POA: Diagnosis not present

## 2020-09-02 NOTE — Telephone Encounter (Signed)
Scheduled [facility] visit for 09-06-20 at 8:30.

## 2020-09-02 NOTE — Telephone Encounter (Signed)
Phone call placed to patient to offer to schedule a visit with [facility]. Phone rang, with no answer I left a voicemail for call back. 

## 2020-09-02 NOTE — Telephone Encounter (Signed)
Patient's wife called and reports her husband is having difficulty swallowing today, he has just had coffee. She tried applesauce but he ate just a little. Patient is coming in for tx today and wife advised if need be the MD covering Dr. Sondra Come could see him after tx.  She was advised to try fluids that were not extreme temperature wise and soft foods.

## 2020-09-05 ENCOUNTER — Ambulatory Visit
Admission: RE | Admit: 2020-09-05 | Discharge: 2020-09-05 | Disposition: A | Discharge status: Home / Self Care | Payer: Medicare Other | Source: Ambulatory Visit | Attending: Radiation Oncology | Admitting: Radiation Oncology

## 2020-09-05 ENCOUNTER — Other Ambulatory Visit: Payer: Self-pay

## 2020-09-05 DIAGNOSIS — C160 Malignant neoplasm of cardia: Secondary | ICD-10-CM | POA: Diagnosis not present

## 2020-09-05 DIAGNOSIS — Z510 Encounter for antineoplastic radiation therapy: Secondary | ICD-10-CM | POA: Diagnosis not present

## 2020-09-06 ENCOUNTER — Other Ambulatory Visit: Payer: Medicare Other | Admitting: Internal Medicine

## 2020-09-06 ENCOUNTER — Ambulatory Visit
Admission: RE | Admit: 2020-09-06 | Discharge: 2020-09-06 | Disposition: A | Discharge status: Home / Self Care | Payer: Medicare Other | Source: Ambulatory Visit | Attending: Radiation Oncology | Admitting: Radiation Oncology

## 2020-09-06 DIAGNOSIS — E440 Moderate protein-calorie malnutrition: Secondary | ICD-10-CM

## 2020-09-06 DIAGNOSIS — C168 Malignant neoplasm of overlapping sites of stomach: Secondary | ICD-10-CM

## 2020-09-06 DIAGNOSIS — C160 Malignant neoplasm of cardia: Secondary | ICD-10-CM | POA: Diagnosis not present

## 2020-09-06 DIAGNOSIS — Z510 Encounter for antineoplastic radiation therapy: Secondary | ICD-10-CM | POA: Diagnosis not present

## 2020-09-06 DIAGNOSIS — Z515 Encounter for palliative care: Secondary | ICD-10-CM

## 2020-09-07 ENCOUNTER — Telehealth: Payer: Self-pay | Admitting: Hematology & Oncology

## 2020-09-07 ENCOUNTER — Ambulatory Visit
Admission: RE | Admit: 2020-09-07 | Discharge: 2020-09-07 | Disposition: A | Discharge status: Home / Self Care | Payer: Medicare Other | Source: Ambulatory Visit | Attending: Radiation Oncology | Admitting: Radiation Oncology

## 2020-09-07 ENCOUNTER — Other Ambulatory Visit: Payer: Self-pay

## 2020-09-07 DIAGNOSIS — R29810 Facial weakness: Secondary | ICD-10-CM | POA: Diagnosis not present

## 2020-09-07 DIAGNOSIS — G9389 Other specified disorders of brain: Secondary | ICD-10-CM | POA: Diagnosis not present

## 2020-09-07 DIAGNOSIS — I6789 Other cerebrovascular disease: Secondary | ICD-10-CM | POA: Diagnosis not present

## 2020-09-07 DIAGNOSIS — N1832 Chronic kidney disease, stage 3b: Secondary | ICD-10-CM | POA: Diagnosis not present

## 2020-09-07 DIAGNOSIS — I69391 Dysphagia following cerebral infarction: Secondary | ICD-10-CM | POA: Diagnosis not present

## 2020-09-07 DIAGNOSIS — E039 Hypothyroidism, unspecified: Secondary | ICD-10-CM | POA: Diagnosis not present

## 2020-09-07 DIAGNOSIS — I6523 Occlusion and stenosis of bilateral carotid arteries: Secondary | ICD-10-CM | POA: Diagnosis not present

## 2020-09-07 DIAGNOSIS — C168 Malignant neoplasm of overlapping sites of stomach: Secondary | ICD-10-CM | POA: Diagnosis not present

## 2020-09-07 DIAGNOSIS — R55 Syncope and collapse: Secondary | ICD-10-CM | POA: Diagnosis not present

## 2020-09-07 DIAGNOSIS — C160 Malignant neoplasm of cardia: Secondary | ICD-10-CM | POA: Diagnosis not present

## 2020-09-07 DIAGNOSIS — E43 Unspecified severe protein-calorie malnutrition: Secondary | ICD-10-CM | POA: Diagnosis not present

## 2020-09-07 DIAGNOSIS — J45909 Unspecified asthma, uncomplicated: Secondary | ICD-10-CM | POA: Diagnosis not present

## 2020-09-07 DIAGNOSIS — E7800 Pure hypercholesterolemia, unspecified: Secondary | ICD-10-CM | POA: Diagnosis not present

## 2020-09-07 DIAGNOSIS — E785 Hyperlipidemia, unspecified: Secondary | ICD-10-CM | POA: Diagnosis not present

## 2020-09-07 DIAGNOSIS — I129 Hypertensive chronic kidney disease with stage 1 through stage 4 chronic kidney disease, or unspecified chronic kidney disease: Secondary | ICD-10-CM | POA: Diagnosis not present

## 2020-09-07 DIAGNOSIS — I480 Paroxysmal atrial fibrillation: Secondary | ICD-10-CM | POA: Diagnosis not present

## 2020-09-07 DIAGNOSIS — D631 Anemia in chronic kidney disease: Secondary | ICD-10-CM | POA: Diagnosis present

## 2020-09-07 DIAGNOSIS — N183 Chronic kidney disease, stage 3 unspecified: Secondary | ICD-10-CM | POA: Diagnosis not present

## 2020-09-07 DIAGNOSIS — I639 Cerebral infarction, unspecified: Secondary | ICD-10-CM | POA: Diagnosis not present

## 2020-09-07 DIAGNOSIS — C169 Malignant neoplasm of stomach, unspecified: Secondary | ICD-10-CM | POA: Diagnosis not present

## 2020-09-07 DIAGNOSIS — R1310 Dysphagia, unspecified: Secondary | ICD-10-CM | POA: Diagnosis present

## 2020-09-07 DIAGNOSIS — R404 Transient alteration of awareness: Secondary | ICD-10-CM | POA: Diagnosis not present

## 2020-09-07 DIAGNOSIS — E860 Dehydration: Secondary | ICD-10-CM | POA: Diagnosis not present

## 2020-09-07 DIAGNOSIS — E876 Hypokalemia: Secondary | ICD-10-CM | POA: Diagnosis not present

## 2020-09-07 DIAGNOSIS — R410 Disorientation, unspecified: Secondary | ICD-10-CM | POA: Diagnosis not present

## 2020-09-07 DIAGNOSIS — Z20822 Contact with and (suspected) exposure to covid-19: Secondary | ICD-10-CM | POA: Diagnosis not present

## 2020-09-07 DIAGNOSIS — I361 Nonrheumatic tricuspid (valve) insufficiency: Secondary | ICD-10-CM | POA: Diagnosis not present

## 2020-09-07 DIAGNOSIS — I63532 Cerebral infarction due to unspecified occlusion or stenosis of left posterior cerebral artery: Secondary | ICD-10-CM | POA: Diagnosis not present

## 2020-09-07 DIAGNOSIS — H5347 Heteronymous bilateral field defects: Secondary | ICD-10-CM | POA: Diagnosis present

## 2020-09-07 DIAGNOSIS — I10 Essential (primary) hypertension: Secondary | ICD-10-CM | POA: Diagnosis not present

## 2020-09-07 DIAGNOSIS — I69398 Other sequelae of cerebral infarction: Secondary | ICD-10-CM | POA: Diagnosis not present

## 2020-09-07 DIAGNOSIS — Z7189 Other specified counseling: Secondary | ICD-10-CM | POA: Diagnosis not present

## 2020-09-07 DIAGNOSIS — Z515 Encounter for palliative care: Secondary | ICD-10-CM | POA: Diagnosis not present

## 2020-09-07 DIAGNOSIS — R569 Unspecified convulsions: Secondary | ICD-10-CM | POA: Diagnosis not present

## 2020-09-07 DIAGNOSIS — I350 Nonrheumatic aortic (valve) stenosis: Secondary | ICD-10-CM | POA: Diagnosis not present

## 2020-09-07 DIAGNOSIS — M1990 Unspecified osteoarthritis, unspecified site: Secondary | ICD-10-CM | POA: Diagnosis not present

## 2020-09-07 DIAGNOSIS — Z66 Do not resuscitate: Secondary | ICD-10-CM | POA: Diagnosis not present

## 2020-09-07 DIAGNOSIS — I69328 Other speech and language deficits following cerebral infarction: Secondary | ICD-10-CM | POA: Diagnosis not present

## 2020-09-07 DIAGNOSIS — R64 Cachexia: Secondary | ICD-10-CM | POA: Diagnosis not present

## 2020-09-07 DIAGNOSIS — I6389 Other cerebral infarction: Secondary | ICD-10-CM | POA: Diagnosis not present

## 2020-09-07 DIAGNOSIS — I352 Nonrheumatic aortic (valve) stenosis with insufficiency: Secondary | ICD-10-CM | POA: Diagnosis not present

## 2020-09-07 DIAGNOSIS — R4182 Altered mental status, unspecified: Secondary | ICD-10-CM | POA: Diagnosis not present

## 2020-09-07 NOTE — Progress Notes (Addendum)
[place] Consult Note Telephone: [phone]  Fax: [phone]  PATIENT NAME: Roy David DOB: 05/16/1930 MRN: [id number]  PRIMARY CARE PROVIDER:   [name], L.Marlou Sa, MD  REFERRING PROVIDER:  [name], Carlean Jews.Marlou Sa, [place] [employer] [place],  [place] [address]     RESPONSIBLE PARTYTim, Corriher Spouse [phone]      RECOMMENDATIONS and PLAN:  Palliative care encounter  Z51.5  1.Advance care planning:  Explanation of palliative and hospice care services.  Goals of care for patient, per his wife, are to complete radiation therapy treatments, continue blood transfusions when they are needed.  Reviewed MOST form and confirmed pt's selections with her.  Her advanced directives are DNAR/DNI, comfort measures, trial IV fluids,  determine use of antibiotics, no tube feedings. MOST and DNR forms were signed and uploaded into chart. Pt and wife are interested in comfort/hospice care in the future when eligible.  Palliative care will continue to f/u with patient in aprox 2-3 weeks.  Addendum:  Wife and patient want CPR attempted at this time.  MOST form designates same.    2.  Weakness:  Multifactorial( anemia related to malignancy, gastric cancer, weight loss).  Assist with all ADLs as needed.  Gate belt for repositioning.  Fall risk prevention encouraged. Encouraged Architect of an elevated platform for pt's recliner.  He declines to use a lift chair for positioning.  Improve nutritional intake.  3.  Gastric cancer:  Completion of radiation therapy as planned.  Wife desires to continue with blood transfusions if necessary.  Re-evaluate status after nearing completion of radiation.  Re-address goals of care during future palliative care visits.  Attempt to assist with locating transportation to and from radiation therapy.   4.  Protein calorie malnutrition:  Comfort meals and snacks.  Hi calorie nutritional beverages ideally 2-3  per day.  5.  Alteration of motion, R shoulder :  Continue Gabapentin as prescribed.  Tylenol 500mg  as needed for mild pain. Pending plans for follow-up with orthopedic provider related to same.  I spent 90 minutes providing this consultation,  from 0845 to 1015. More than 50% of the time in this consultation was spent coordinating communication with patient, wife and son in law who was present during visit.   HISTORY OF PRESENT ILLNESS:  Roy David is a 86 y.o. year old male with multiple medical problems including afib, gastric cancer, rectal bleeding related to gastric cancer.  Wife reports that patient continues to have "black stools", has a poor appetite(milk shakes) and has become much weaker in the past 2-3 months since dx of gastric cancer.  He requires assistance with all ADLs and assistance with standing. Wife reports that he is beginning to have some intermittent confusion.  Very minimal use of his RUE since hospitalization. Hx of previous discomfort of R shoulder. Family and neighbors assist with transportation as wife does not drive.  Palliative Care was asked to help address goals of care.   CODE STATUS: FULL CDE  PPS: 40%  Weak with use of a walker HOSPICE ELIGIBILITY/DIAGNOSIS: TBD  PAST MEDICAL HISTORY:  Past Medical History:  Diagnosis Date  . Arthritis   . Asthma    followed at [facility], stable on Asmanex  . Diverticulosis   . Dysrhythmia   . Gastric cancer ([place]) 08/25/2020  . Goals of care, counseling/discussion 08/25/2020  . H/O seasonal allergies    Dr [name]  . Hypercholesteremia   . Hypertension   .  Hypochromic-microcytic anemia   . Microcytic anemia   . Paroxysmal atrial fibrillation ([place]) 03/06/2016   on Xarelto  . Prostate cancer [facility])    12/10- Dr Dahlstedt/Dr Valere Dross  . Thyroid disease    hypothyroidism    SOCIAL HX: Lives with wife Social History   Tobacco Use  . Smoking status: Former Smoker    Quit date: 12/09/1959    Years since quitting: 60.7  .  Smokeless tobacco: Never Used  Substance Use Topics  . Alcohol use: Yes    Alcohol/week: 14.0 standard drinks    Types: 7 Shots of liquor, 7 Cans of beer per week    ALLERGIES:  Allergies  Allergen Reactions  . Bee Venom Nausea Only and Other (See Comments)    Wasp and yellow jackets     PERTINENT MEDICATIONS:  Outpatient Encounter Medications as of 09/06/2020  Medication Sig  . aspirin EC 81 MG tablet Take 81 mg by mouth daily.  . cholecalciferol (VITAMIN D) 1000 units tablet Take 1,000 Units by mouth daily.   . Coenzyme Q10 200 MG TABS Take 200 mg by mouth daily.   Marland Kitchen doxycycline (MONODOX) 100 MG capsule Take 100 mg by mouth 2 (two) times daily as needed (asthma flare up).   . EPINEPHrine 0.3 mg/0.3 mL IJ SOAJ injection Inject 0.3 mg into the muscle once.  Marland Kitchen esomeprazole (NEXIUM) 40 MG capsule Take 1 capsule (40 mg total) by mouth daily. (Patient taking differently: Take 80 mg by mouth daily. )  . ezetimibe-simvastatin (VYTORIN) 10-20 MG per tablet Take 1 tablet by mouth daily.  . ferrous sulfate 325 (65 FE) MG tablet Take 325 mg by mouth daily with breakfast. (Patient not taking: Reported on 08/30/2020)  . gabapentin (NEURONTIN) 300 MG capsule Take 300 mg by mouth 2 (two) times daily.   Marland Kitchen GLUCOSAMINE-CHONDROITIN PO Take 1 tablet by mouth daily.   Marland Kitchen levothyroxine (SYNTHROID, LEVOTHROID) 100 MCG tablet Take 100 mcg by mouth daily.   . mometasone (ASMANEX 60 METERED DOSES) 220 MCG/INH inhaler Inhale 1 Inhaler into the lungs daily as needed (asthma flare up).   . ondansetron (ZOFRAN) 4 MG tablet Take 1 tablet (4 mg total) by mouth every 8 (eight) hours as needed for nausea or vomiting.  . predniSONE (DELTASONE) 20 MG tablet Take 20 mg by mouth 2 (two) times daily as needed (asthma flare up).   . triamcinolone cream (KENALOG) 0.1 % Apply 1 application topically 2 (two) times daily as needed (itching).   . vitamin B-12 (CYANOCOBALAMIN) 1000 MCG tablet Take 1,000 mcg by mouth daily. (Patient  not taking: Reported on 08/30/2020)   No facility-administered encounter medications on file as of 09/06/2020.    PHYSICAL EXAM:   General: NAD, frail and chronically ill appearing, thin elderly male in a low sitting recliner Cardiovascular: regular rate and rhythm. Systolic murmur Pulmonary: clear throughout  Oxygen sats 94% Abdomen: soft, nontender, + bowel sounds Extremities: no edema, decreased ROM RUE. Manually moves RUE with L hand. Skin: Pale in color, exposed skin is intact Neurological: Alert and oriented to person and place. Weakness.  Requires cueing for completion of tasks and assistance in rising to a standing position.     Gonzella Lex, NP-C

## 2020-09-07 NOTE — Telephone Encounter (Signed)
I called and advised patient that I have sent a staff message over to [facility] regarding setting up transportation for her & her husband for [employer].  She was very thankful .  I advised her that someone should be reaching our to her in order to set these appointments up due to the fact that her husband has Dementia.    I also called Gonzella Lex [phone] @ [facility] and advised that someone should be reaching out to the patient regarding transportation

## 2020-09-08 ENCOUNTER — Ambulatory Visit
Admission: RE | Admit: 2020-09-08 | Discharge: 2020-09-08 | Disposition: A | Discharge status: Home / Self Care | Payer: Medicare Other | Source: Ambulatory Visit | Attending: Radiation Oncology | Admitting: Radiation Oncology

## 2020-09-08 ENCOUNTER — Other Ambulatory Visit: Payer: Self-pay

## 2020-09-08 DIAGNOSIS — C160 Malignant neoplasm of cardia: Secondary | ICD-10-CM | POA: Diagnosis not present

## 2020-09-09 ENCOUNTER — Ambulatory Visit
Admission: RE | Admit: 2020-09-09 | Discharge: 2020-09-09 | Disposition: A | Discharge status: Home / Self Care | Payer: Medicare Other | Source: Ambulatory Visit | Attending: Radiation Oncology | Admitting: Radiation Oncology

## 2020-09-09 DIAGNOSIS — C160 Malignant neoplasm of cardia: Secondary | ICD-10-CM | POA: Insufficient documentation

## 2020-09-09 DIAGNOSIS — Z510 Encounter for antineoplastic radiation therapy: Secondary | ICD-10-CM | POA: Insufficient documentation

## 2020-09-10 ENCOUNTER — Other Ambulatory Visit: Payer: Self-pay

## 2020-09-10 ENCOUNTER — Inpatient Hospital Stay (HOSPITAL_COMMUNITY)
Admission: EM | Admit: 2020-09-10 | Discharge: 2020-09-20 | DRG: 064 | Disposition: A | Discharge status: Hospice | Payer: Medicare Other | Attending: Internal Medicine | Admitting: Internal Medicine

## 2020-09-10 ENCOUNTER — Emergency Department (HOSPITAL_COMMUNITY): Payer: Medicare Other

## 2020-09-10 ENCOUNTER — Encounter (HOSPITAL_COMMUNITY): Payer: Self-pay

## 2020-09-10 DIAGNOSIS — F419 Anxiety disorder, unspecified: Secondary | ICD-10-CM | POA: Diagnosis not present

## 2020-09-10 DIAGNOSIS — I129 Hypertensive chronic kidney disease with stage 1 through stage 4 chronic kidney disease, or unspecified chronic kidney disease: Secondary | ICD-10-CM | POA: Diagnosis present

## 2020-09-10 DIAGNOSIS — D649 Anemia, unspecified: Secondary | ICD-10-CM | POA: Diagnosis present

## 2020-09-10 DIAGNOSIS — I69398 Other sequelae of cerebral infarction: Secondary | ICD-10-CM

## 2020-09-10 DIAGNOSIS — Z7951 Long term (current) use of inhaled steroids: Secondary | ICD-10-CM

## 2020-09-10 DIAGNOSIS — C169 Malignant neoplasm of stomach, unspecified: Secondary | ICD-10-CM | POA: Diagnosis present

## 2020-09-10 DIAGNOSIS — E7800 Pure hypercholesterolemia, unspecified: Secondary | ICD-10-CM | POA: Diagnosis present

## 2020-09-10 DIAGNOSIS — Z6822 Body mass index (BMI) 22.0-22.9, adult: Secondary | ICD-10-CM

## 2020-09-10 DIAGNOSIS — Z66 Do not resuscitate: Secondary | ICD-10-CM | POA: Diagnosis not present

## 2020-09-10 DIAGNOSIS — I63532 Cerebral infarction due to unspecified occlusion or stenosis of left posterior cerebral artery: Principal | ICD-10-CM | POA: Diagnosis present

## 2020-09-10 DIAGNOSIS — I69328 Other speech and language deficits following cerebral infarction: Secondary | ICD-10-CM

## 2020-09-10 DIAGNOSIS — R1310 Dysphagia, unspecified: Secondary | ICD-10-CM | POA: Diagnosis present

## 2020-09-10 DIAGNOSIS — I352 Nonrheumatic aortic (valve) stenosis with insufficiency: Secondary | ICD-10-CM | POA: Diagnosis present

## 2020-09-10 DIAGNOSIS — J45909 Unspecified asthma, uncomplicated: Secondary | ICD-10-CM | POA: Diagnosis present

## 2020-09-10 DIAGNOSIS — R5381 Other malaise: Secondary | ICD-10-CM | POA: Diagnosis present

## 2020-09-10 DIAGNOSIS — E43 Unspecified severe protein-calorie malnutrition: Secondary | ICD-10-CM | POA: Diagnosis present

## 2020-09-10 DIAGNOSIS — Z79890 Hormone replacement therapy (postmenopausal): Secondary | ICD-10-CM

## 2020-09-10 DIAGNOSIS — Z515 Encounter for palliative care: Secondary | ICD-10-CM

## 2020-09-10 DIAGNOSIS — I480 Paroxysmal atrial fibrillation: Secondary | ICD-10-CM | POA: Diagnosis present

## 2020-09-10 DIAGNOSIS — Z87891 Personal history of nicotine dependence: Secondary | ICD-10-CM

## 2020-09-10 DIAGNOSIS — D631 Anemia in chronic kidney disease: Secondary | ICD-10-CM | POA: Diagnosis present

## 2020-09-10 DIAGNOSIS — R29707 NIHSS score 7: Secondary | ICD-10-CM | POA: Diagnosis present

## 2020-09-10 DIAGNOSIS — E785 Hyperlipidemia, unspecified: Secondary | ICD-10-CM | POA: Diagnosis present

## 2020-09-10 DIAGNOSIS — Z7952 Long term (current) use of systemic steroids: Secondary | ICD-10-CM

## 2020-09-10 DIAGNOSIS — R4701 Aphasia: Secondary | ICD-10-CM | POA: Diagnosis present

## 2020-09-10 DIAGNOSIS — Z8249 Family history of ischemic heart disease and other diseases of the circulatory system: Secondary | ICD-10-CM

## 2020-09-10 DIAGNOSIS — Z8546 Personal history of malignant neoplasm of prostate: Secondary | ICD-10-CM

## 2020-09-10 DIAGNOSIS — N1832 Chronic kidney disease, stage 3b: Secondary | ICD-10-CM | POA: Diagnosis present

## 2020-09-10 DIAGNOSIS — Z79899 Other long term (current) drug therapy: Secondary | ICD-10-CM

## 2020-09-10 DIAGNOSIS — Z1501 Genetic susceptibility to malignant neoplasm of breast: Secondary | ICD-10-CM

## 2020-09-10 DIAGNOSIS — Z7189 Other specified counseling: Secondary | ICD-10-CM

## 2020-09-10 DIAGNOSIS — D630 Anemia in neoplastic disease: Secondary | ICD-10-CM | POA: Diagnosis present

## 2020-09-10 DIAGNOSIS — E039 Hypothyroidism, unspecified: Secondary | ICD-10-CM | POA: Diagnosis present

## 2020-09-10 DIAGNOSIS — R627 Adult failure to thrive: Secondary | ICD-10-CM | POA: Diagnosis present

## 2020-09-10 DIAGNOSIS — Z91030 Bee allergy status: Secondary | ICD-10-CM

## 2020-09-10 DIAGNOSIS — Z7982 Long term (current) use of aspirin: Secondary | ICD-10-CM

## 2020-09-10 DIAGNOSIS — I639 Cerebral infarction, unspecified: Secondary | ICD-10-CM | POA: Diagnosis present

## 2020-09-10 DIAGNOSIS — I10 Essential (primary) hypertension: Secondary | ICD-10-CM | POA: Diagnosis present

## 2020-09-10 DIAGNOSIS — Z823 Family history of stroke: Secondary | ICD-10-CM

## 2020-09-10 DIAGNOSIS — H5347 Heteronymous bilateral field defects: Secondary | ICD-10-CM | POA: Diagnosis present

## 2020-09-10 DIAGNOSIS — I69391 Dysphagia following cerebral infarction: Secondary | ICD-10-CM

## 2020-09-10 DIAGNOSIS — R64 Cachexia: Secondary | ICD-10-CM | POA: Diagnosis not present

## 2020-09-10 DIAGNOSIS — Z20822 Contact with and (suspected) exposure to covid-19: Secondary | ICD-10-CM | POA: Diagnosis present

## 2020-09-10 DIAGNOSIS — E860 Dehydration: Secondary | ICD-10-CM | POA: Diagnosis not present

## 2020-09-10 DIAGNOSIS — M1990 Unspecified osteoarthritis, unspecified site: Secondary | ICD-10-CM | POA: Diagnosis present

## 2020-09-10 DIAGNOSIS — E876 Hypokalemia: Secondary | ICD-10-CM | POA: Diagnosis not present

## 2020-09-10 LAB — COMPREHENSIVE METABOLIC PANEL
ALT: 13 U/L (ref 0–44)
AST: 39 U/L (ref 15–41)
Albumin: 2.8 g/dL — ABNORMAL LOW (ref 3.5–5.0)
Alkaline Phosphatase: 57 U/L (ref 38–126)
Anion gap: 11 (ref 5–15)
BUN: 20 mg/dL (ref 8–23)
CO2: 22 mmol/L (ref 22–32)
Calcium: 9.2 mg/dL (ref 8.9–10.3)
Chloride: 105 mmol/L (ref 98–111)
Creatinine, Ser: 1.51 mg/dL — ABNORMAL HIGH (ref 0.61–1.24)
GFR calc Af Amer: 47 mL/min — ABNORMAL LOW (ref >60)
GFR calc non Af Amer: 41 mL/min — ABNORMAL LOW (ref >60)
Glucose, Bld: 100 mg/dL — ABNORMAL HIGH (ref 70–99)
Potassium: 4.1 mmol/L (ref 3.5–5.1)
Sodium: 138 mmol/L (ref 135–145)
Total Bilirubin: 1.1 mg/dL (ref 0.3–1.2)
Total Protein: 5.8 g/dL — ABNORMAL LOW (ref 6.5–8.1)

## 2020-09-10 LAB — TYPE AND SCREEN
ABO/RH(D): A POS
Antibody Screen: NEGATIVE

## 2020-09-10 LAB — CBC
HCT: 29.3 % — ABNORMAL LOW (ref 39.0–52.0)
Hemoglobin: 9.1 g/dL — ABNORMAL LOW (ref 13.0–17.0)
MCH: 28.4 pg (ref 26.0–34.0)
MCHC: 31.1 g/dL (ref 30.0–36.0)
MCV: 91.6 fL (ref 80.0–100.0)
Platelets: 263 10*3/uL (ref 150–400)
RBC: 3.2 MIL/uL — ABNORMAL LOW (ref 4.22–5.81)
RDW: 14.9 % (ref 11.5–15.5)
WBC: 6.1 10*3/uL (ref 4.0–10.5)
nRBC: 0 % (ref 0.0–0.2)

## 2020-09-10 LAB — PROTIME-INR
INR: 1.1 (ref 0.8–1.2)
Prothrombin Time: 13.8 seconds (ref 11.4–15.2)

## 2020-09-10 NOTE — ED Provider Notes (Signed)
[facility] EMERGENCY DEPARTMENT Provider Note   CSN: [id number] Arrival date & time: 09/10/20  1935     History Chief Complaint  Patient presents with  . Altered Mental Status    Roy David is a 86 y.o. male.  HPI     86 year old male with history of A. fib, recently diagnosed gastric carcinoma, hyperlipidemia comes in a chief complaint of altered mental status.  According to patient's wife, who is at the bedside now, she called fire/EMS when patient became short of breath.  When fire arrived they noted that patient was having difficulty getting the right words out and was hard to understand.  EMS was called for altered mental status.  However nursing staff also noted that patient was confused.  By the time I assessed him, wife is at the bedside and patient is answering questions more fluently.  Patient has no complaints from his side.  Wife reports that he has not been eating well for the last 2 or 3 days.  No trauma.  No history of strokes.  Past Medical History:  Diagnosis Date  . Arthritis   . Asthma    followed at [facility], stable on Asmanex  . Diverticulosis   . Dysrhythmia   . Gastric cancer ([place]) 08/25/2020  . Goals of care, counseling/discussion 08/25/2020  . H/O seasonal allergies    Dr [name]  . Hypercholesteremia   . Hypertension   . Hypochromic-microcytic anemia   . Microcytic anemia   . Paroxysmal atrial fibrillation ([place]) 03/06/2016   on Xarelto  . Prostate cancer [facility])    12/10- Dr Dahlstedt/Dr Valere Dross  . Thyroid disease    hypothyroidism    Patient Active Problem List   Diagnosis Date Noted  . Gastric cancer ([place]) 08/25/2020  . Goals of care, counseling/discussion 08/25/2020  . Iron deficiency anemia due to chronic blood loss 01/18/2019  . Status post placement of implantable loop recorder 03/05/2018  . Anemia   . Hypochromic-microcytic anemia   . Symptomatic anemia 01/15/2018  . Paroxysmal atrial fibrillation ([place]) 03/06/2016  .  Central retinal artery occlusion of right eye 11/23/2015  . Aortic valve stenosis, nonrheumatic 11/23/2015  . Essential hypertension 11/23/2015  . Hypercholesterolemia 11/23/2015    Past Surgical History:  Procedure Laterality Date  . APPENDECTOMY    . BIOPSY  08/21/2020   Procedure: BIOPSY;  Surgeon: Ronnette Juniper, MD;  Location: [facility];  Service: Gastroenterology;;  . COLONOSCOPY N/A 01/05/2020   Procedure: COLONOSCOPY;  Surgeon: Clarene Essex, MD;  Location: [facility];  Service: Endoscopy;  Laterality: N/A;  . EP IMPLANTABLE DEVICE N/A 11/29/2015   Procedure: Loop Recorder Insertion;  Surgeon: Sanda Klein, MD;  Location: [facility] CV LAB;  Service: Cardiovascular;  Laterality: N/A;  . ESOPHAGOGASTRODUODENOSCOPY (EGD) WITH PROPOFOL N/A 08/21/2020   Procedure: ESOPHAGOGASTRODUODENOSCOPY (EGD) WITH PROPOFOL;  Surgeon: Ronnette Juniper, MD;  Location: [facility];  Service: Gastroenterology;  Laterality: N/A;  . HERNIA REPAIR    . RETINAL DETACHMENT SURGERY         Family History  Problem Relation Age of Onset  . Heart attack Mother   . Stroke Father   . Stroke Sister     Social History   Tobacco Use  . Smoking status: Former Smoker    Quit date: 12/09/1959    Years since quitting: 60.7  . Smokeless tobacco: Never Used  Vaping Use  . Vaping Use: Never used  Substance Use Topics  . Alcohol use: Yes    Alcohol/week: 14.0  standard drinks    Types: 7 Shots of liquor, 7 Cans of beer per week  . Drug use: No    Home Medications Prior to Admission medications   Medication Sig Start Date End Date Taking? Authorizing Provider  doxycycline (MONODOX) 100 MG capsule Take 100 mg by mouth 2 (two) times daily as needed (to prevent infection during asthma flares).    Yes [provider]  EPINEPHrine 0.3 mg/0.3 mL IJ SOAJ injection Inject 0.3 mg into the muscle once as needed for anaphylaxis (yellow jacket stings).    Yes [provider]  ezetimibe-simvastatin  (VYTORIN) 10-20 MG per tablet Take 1 tablet by mouth every evening.    Yes [provider]  gabapentin (NEURONTIN) 300 MG capsule Take 300 mg by mouth daily.    Yes [provider]  levothyroxine (SYNTHROID, LEVOTHROID) 100 MCG tablet Take 100 mcg by mouth daily.  10/06/15  Yes [provider]  mometasone (ASMANEX 60 METERED DOSES) 220 MCG/INH inhaler Inhale 1 puff into the lungs daily as needed (asthma flare up).  10/18/15  Yes [provider]  ondansetron (ZOFRAN) 4 MG tablet Take 1 tablet (4 mg total) by mouth every 8 (eight) hours as needed for nausea or vomiting. 08/30/20  Yes Gery Pray, MD  pantoprazole (PROTONIX) 40 MG tablet Take 40 mg by mouth 2 (two) times daily. 09/01/20  Yes [provider]  predniSONE (DELTASONE) 20 MG tablet Take 20 mg by mouth 2 (two) times daily as needed (asthma flare up).  07/28/19  Yes [provider]  sucralfate (CARAFATE) 1 GM/10ML suspension Take 1 g by mouth 3 (three) times daily. 09/02/20  Yes [provider]  Tetrahydrozoline HCl (VISINE OP) Place 1 drop into both eyes 2 (two) times daily.   Yes [provider]  triamcinolone cream (KENALOG) 0.1 % Apply 1 application topically 2 (two) times daily as needed (itching).  10/01/19  Yes [provider]  esomeprazole (NEXIUM) 40 MG capsule Take 1 capsule (40 mg total) by mouth daily. Patient not taking: Reported on 09/10/2020 08/23/20 10/22/20  Little Ishikawa, MD    Allergies    Bee venom  Review of Systems   Review of Systems  Constitutional: Positive for activity change.  Respiratory: Positive for shortness of breath.   Cardiovascular: Negative for chest pain.  Gastrointestinal: Negative for nausea and vomiting.  Psychiatric/Behavioral: Positive for confusion.  All other systems reviewed and are negative.   Physical Exam Updated Vital Signs BP 126/81   Pulse 84   Temp 98.6 F (37 C) (Oral)   Resp 17   Ht 5' 8.5"  (1.74 m)   Wt 68 kg   SpO2 99%   BMI 22.48 kg/m   Physical Exam Vitals and nursing note reviewed.  Constitutional:      Appearance: He is well-developed.  HENT:     Head: Normocephalic and atraumatic.  Eyes:     Extraocular Movements: Extraocular movements intact.     Pupils: Pupils are equal, round, and reactive to light.  Neck:     Vascular: No JVD.  Cardiovascular:     Rate and Rhythm: Normal rate and regular rhythm.  Pulmonary:     Effort: Pulmonary effort is normal. No respiratory distress.     Breath sounds: Normal breath sounds. No wheezing.  Abdominal:     General: Bowel sounds are normal. There is no distension.     Palpations: Abdomen is soft.     Tenderness: There is no abdominal  tenderness. There is no guarding or rebound.  Musculoskeletal:     Cervical back: Normal range of motion and neck supple.  Skin:    General: Skin is warm and dry.  Neurological:     Mental Status: He is alert.     Cranial Nerves: No cranial nerve deficit.     Coordination: Coordination normal.     Comments: Oriented to self and location No objective sensory deficits, Motor strength upper and lower extremity 4+ and equal Normal cerebellar exam  When asked about year-  Pt keeps telling us his zipcode     ED Results / Procedures / Treatments   Labs (all labs ordered are listed, but only abnormal results are displayed) Labs Reviewed  COMPREHENSIVE METABOLIC PANEL - Abnormal; Notable for the following components:      Result Value   Glucose, Bld 100 (*)    Creatinine, Ser 1.51 (*)    Total Protein 5.8 (*)    Albumin 2.8 (*)    GFR calc non Af Amer 41 (*)    GFR calc Af Amer 47 (*)    All other components within normal limits  CBC - Abnormal; Notable for the following components:   RBC 3.20 (*)    Hemoglobin 9.1 (*)    HCT 29.3 (*)    All other components within normal limits  RESPIRATORY PANEL BY RT PCR (FLU A&B, COVID)  PROTIME-INR  URINALYSIS, ROUTINE W REFLEX  MICROSCOPIC  CBG MONITORING, ED  TYPE AND SCREEN    EKG None  Radiology CT Head Wo Contrast  Result Date: 09/10/2020 CLINICAL DATA:  Mental status change EXAM: CT HEAD WITHOUT CONTRAST TECHNIQUE: Contiguous axial images were obtained from the base of the skull through the vertex without intravenous contrast. COMPARISON:  August 21, 2020 FINDINGS: Brain: There is an area of hypodensity seen along the posterior right occipital lobe with loss of gray-white differentiation. No mass effect or midline shift. Prior lacunar infarct seen within the right basal ganglia. There is dilatation the ventricles and sulci consistent with age-related atrophy. Low-attenuation changes in the deep white matter consistent with small vessel ischemia. Vascular: No hyperdense vessel or unexpected calcification. Skull: The skull is intact. No fracture or focal lesion identified. Sinuses/Orbits: The visualized paranasal sinuses and mastoid air cells are clear. The orbits and globes intact. Other: None IMPRESSION: Acute/subacute infarct involving the right occipital lobe. Findings consistent with age related atrophy and chronic small vessel ischemia Electronically Signed   By: Prudencio Pair M.D.   On: 09/10/2020 23:27    Procedures Procedures (including critical care time)  Medications Ordered in ED Medications - No data to display  ED Course  I have reviewed the triage vital signs and the nursing notes.  Pertinent labs & imaging results that were available during my care of the patient were reviewed by me and considered in my medical decision making (see chart for details).    MDM Rules/Calculators/A&P                          Patient comes in a chief complaint of altered mental status.  Patient has history of gastric carcinoma, undergoing radiation therapy.  It appears that he had a sudden episode of shortness of breath.  When EMS arrived they noted that patient was confused and he was having difficulty getting  the right words out.  He was confused when he arrived to the ER per nursing staff, however during my assessment  he is back to baseline.  The wife is at the bedside and reports that patient is acting normally.  The only thing different we noted was that patient was still giving ZIP Code when asked about the year.  No other focal neurologic deficits are picked up.  We ordered a CT scan of the head to ensure there is no evidence of stroke versus metastatic process.  CT scan is showing some concerns for acute/subacute stroke.  Neurology consulted, will get CT angiogram.  Per wife, patient was last normal around 5 PM, but she is not entirely sure.  Final Clinical Impression(s) / ED Diagnoses Final diagnoses:  Acute stroke due to ischemia [facility])    Rx / DC Orders ED Discharge Orders    None       Varney Biles, MD 09/10/20 2349

## 2020-09-10 NOTE — ED Triage Notes (Signed)
Pt with EMS from home with AMS x1 day. Pt was driving as of 2 weeks ago; was recently hospitalized, fell in the bathroom, and has been declining since. Pt AAOx2. Pt has cancer and has received 7 radiation tx to date. Pt has difficulty getting answers out.

## 2020-09-10 NOTE — H&P (Signed)
History and Physical    Roy David YQM:[id number] DOB: 08/30/1930 DOA: 09/10/2020  PCP: [name], L.Marlou Sa, MD  Patient coming from: Home via EMS  I have personally briefly reviewed patient's old medical records in [place]  Chief Complaint: Altered mental status  HPI: Roy David is a 86 y.o. male with medical history significant for recently diagnosed gastric carcinoma undergoing radiation treatment, paroxysmal atrial fibrillation not on AC due to GI bleed, HTN, HLD, hypothyroidism, asthma, anemia, CKD stage III, who presents to the ED for evaluation of altered mental status.  Patient recently admitted 08/20/2020-08/23/2020 for symptomatic anemia due to upper GI bleed.  He was found to have gastric cancer and required transfusion 3 unit PRBCs while in hospital.  Patient has been fatigued with decline in functional status since leaving the hospital.  He has been undergoing radiation treatments completing 7 out of 15 treatment so far.  He has had trouble with vision since leaving the hospital.  On 10/2 he appeared to be altered and withdrawn compared to his usual self.  His wife called fire department and on their arrival they noted that his speech was garbled and he was not able to say what he wanted to say.  He was not oriented to self when EMS arrived.  At time of my evaluation, patient speech is fluent although slow.  He does not recall the events of today.  He denies any focal weakness or change in sensation.  He feels near his baseline.  ED Course:  Initial vitals showed BP 139/75, pulse 79, RR 27, temp 98.6 Fahrenheit, SPO2 96% on room air.  Labs show WBC 6.1, hemoglobin 9.1, platelets 263,000, sodium 138, potassium 4.8, bicarb 22, BUN 20, creatinine 1.51, serum glucose 100.  CT head without contrast shows acute/subacute infarct involving the right occipital lobe and findings consistent with age-related Atrophy and chronic small vessel ischemia.  EDP consulted neurology who  recommended further imaging with CTA head/neck and cerebral perfusion study.  The hospitalist service was consulted to admit for further evaluation and management.  Review of Systems: All systems reviewed and are negative except as documented in history of present illness above.   Past Medical History:  Diagnosis Date  . Arthritis   . Asthma    followed at [facility], stable on Asmanex  . Diverticulosis   . Dysrhythmia   . Gastric cancer ([place]) 08/25/2020  . Goals of care, counseling/discussion 08/25/2020  . H/O seasonal allergies    Dr [name]  . Hypercholesteremia   . Hypertension   . Hypochromic-microcytic anemia   . Microcytic anemia   . Paroxysmal atrial fibrillation ([place]) 03/06/2016   on Xarelto  . Prostate cancer [facility])    12/10- Dr Dahlstedt/Dr Valere Dross  . Thyroid disease    hypothyroidism    Past Surgical History:  Procedure Laterality Date  . APPENDECTOMY    . BIOPSY  08/21/2020   Procedure: BIOPSY;  Surgeon: Ronnette Juniper, MD;  Location: [employer];  Service: Gastroenterology;;  . COLONOSCOPY N/A 01/05/2020   Procedure: COLONOSCOPY;  Surgeon: Clarene Essex, MD;  Location: [place];  Service: Endoscopy;  Laterality: N/A;  . EP IMPLANTABLE DEVICE N/A 11/29/2015   Procedure: Loop Recorder Insertion;  Surgeon: Sanda Klein, MD;  Location: [place] [facility];  Service: Cardiovascular;  Laterality: N/A;  . ESOPHAGOGASTRODUODENOSCOPY (EGD) WITH PROPOFOL N/A 08/21/2020   Procedure: ESOPHAGOGASTRODUODENOSCOPY (EGD) WITH PROPOFOL;  Surgeon: Ronnette Juniper, MD;  Location: [place];  Service: Gastroenterology;  Laterality: N/A;  . HERNIA REPAIR    .  RETINAL DETACHMENT SURGERY      Social History:  reports that he quit smoking about 60 years ago. He has never used smokeless tobacco. He reports current alcohol use of about 14.0 standard drinks of alcohol per week. He reports that he does not use drugs.  Allergies  Allergen Reactions  . Bee Venom Anaphylaxis    Wasp and yellow  jackets    Family History  Problem Relation Age of Onset  . Heart attack Mother   . Stroke Father   . Stroke Sister      Prior to Admission medications   Medication Sig Start Date End Date Taking? Authorizing Provider  doxycycline (MONODOX) 100 MG capsule Take 100 mg by mouth 2 (two) times daily as needed (to prevent infection during asthma flares).    Yes [provider]  EPINEPHrine 0.3 mg/0.3 mL IJ SOAJ injection Inject 0.3 mg into the muscle once as needed for anaphylaxis (yellow jacket stings).    Yes [provider]  ezetimibe-simvastatin (VYTORIN) 10-20 MG per tablet Take 1 tablet by mouth every evening.    Yes [provider]  gabapentin (NEURONTIN) 300 MG capsule Take 300 mg by mouth daily.    Yes [provider]  levothyroxine (SYNTHROID, LEVOTHROID) 100 MCG tablet Take 100 mcg by mouth daily.  10/06/15  Yes [provider]  mometasone (ASMANEX 60 METERED DOSES) 220 MCG/INH inhaler Inhale 1 puff into the lungs daily as needed (asthma flare up).  10/18/15  Yes [provider]  ondansetron (ZOFRAN) 4 MG tablet Take 1 tablet (4 mg total) by mouth every 8 (eight) hours as needed for nausea or vomiting. 08/30/20  Yes Gery Pray, MD  pantoprazole (PROTONIX) 40 MG tablet Take 40 mg by mouth 2 (two) times daily. 09/01/20  Yes [provider]  predniSONE (DELTASONE) 20 MG tablet Take 20 mg by mouth 2 (two) times daily as needed (asthma flare up).  07/28/19  Yes [provider]  sucralfate (CARAFATE) 1 GM/10ML suspension Take 1 g by mouth 3 (three) times daily. 09/02/20  Yes [provider]  Tetrahydrozoline HCl (VISINE OP) Place 1 drop into both eyes 2 (two) times daily.   Yes [provider]  triamcinolone cream (KENALOG) 0.1 % Apply 1 application topically 2 (two) times daily as needed (itching).  10/01/19  Yes [provider]  esomeprazole (NEXIUM) 40 MG capsule Take 1 capsule (40 mg total)  by mouth daily. Patient not taking: Reported on 09/10/2020 08/23/20 10/22/20  Little Ishikawa, MD    Physical Exam: Vitals:   09/10/20 2300 09/10/20 2315 09/10/20 2330 09/10/20 2345  BP: 135/75 (!) 155/74 136/78 (!) 144/76  Pulse: 85 88 86 84  Resp: (!) 21 (!) 27 (!) 23 20  Temp:      TempSrc:      SpO2: 100% 95% 97% 100%  Weight:      Height:       Constitutional: Elderly man resting in the right lateral decubitus position in bed, NAD, calm, comfortable Eyes: PERRL, lids and conjunctivae normal ENMT: Mucous membranes are dry. Posterior pharynx clear of any exudate or lesions. Neck: normal, supple, no masses. Respiratory: clear to auscultation bilaterally, no wheezing, no crackles. Normal respiratory effort. No accessory muscle use.  Cardiovascular: Regular rate and rhythm, no murmurs / rubs / gallops. No extremity edema. 2+ pedal pulses. Abdomen: no tenderness, no masses palpated. No hepatosplenomegaly. Bowel sounds positive.  Musculoskeletal: no clubbing / cyanosis. No joint deformity upper and lower extremities.  Good ROM, no contractures. Normal muscle tone.  Skin: no rashes, lesions, ulcers. No induration Neurologic: CN 2-12 grossly intact. Sensation intact, Strength 5/5 in all 4.  Psychiatric: Normal judgment and insight. Alert and oriented x 3. Normal mood.   Labs on Admission: I have personally reviewed following labs and imaging studies  CBC: Recent Labs  Lab 09/10/20 2205  WBC 6.1  HGB 9.1*  HCT 29.3*  MCV 91.6  PLT 086   Basic Metabolic Panel: Recent Labs  Lab 09/10/20 2205  NA 138  K 4.1  CL 105  CO2 22  GLUCOSE 100*  BUN 20  CREATININE 1.51*  CALCIUM 9.2   GFR: Estimated Creatinine Clearance: 32.5 mL/min (A) (by C-G formula based on SCr of 1.51 mg/dL (H)). Liver Function Tests: Recent Labs  Lab 09/10/20 2205  AST 39  ALT 13  ALKPHOS 57  BILITOT 1.1  PROT 5.8*  ALBUMIN 2.8*   No results for input(s): LIPASE, AMYLASE in the last 168  hours. No results for input(s): AMMONIA in the last 168 hours. Coagulation Profile: Recent Labs  Lab 09/10/20 2205  INR 1.1   Cardiac Enzymes: No results for input(s): CKTOTAL, CKMB, CKMBINDEX, TROPONINI in the last 168 hours. BNP (last 3 results) No results for input(s): PROBNP in the last 8760 hours. HbA1C: No results for input(s): HGBA1C in the last 72 hours. CBG: No results for input(s): GLUCAP in the last 168 hours. Lipid Profile: No results for input(s): CHOL, HDL, LDLCALC, TRIG, CHOLHDL, LDLDIRECT in the last 72 hours. Thyroid Function Tests: No results for input(s): TSH, T4TOTAL, FREET4, T3FREE, THYROIDAB in the last 72 hours. Anemia Panel: No results for input(s): VITAMINB12, FOLATE, FERRITIN, TIBC, IRON, RETICCTPCT in the last 72 hours. Urine analysis: No results found for: COLORURINE, APPEARANCEUR, LABSPEC, PHURINE, GLUCOSEU, HGBUR, BILIRUBINUR, KETONESUR, PROTEINUR, UROBILINOGEN, NITRITE, LEUKOCYTESUR  Radiological Exams on Admission: CT Head Wo Contrast  Result Date: 09/10/2020 CLINICAL DATA:  Mental status change EXAM: CT HEAD WITHOUT CONTRAST TECHNIQUE: Contiguous axial images were obtained from the base of the skull through the vertex without intravenous contrast. COMPARISON:  August 21, 2020 FINDINGS: Brain: There is an area of hypodensity seen along the posterior right occipital lobe with loss of gray-white differentiation. No mass effect or midline shift. Prior lacunar infarct seen within the right basal ganglia. There is dilatation the ventricles and sulci consistent with age-related atrophy. Low-attenuation changes in the deep white matter consistent with small vessel ischemia. Vascular: No hyperdense vessel or unexpected calcification. Skull: The skull is intact. No fracture or focal lesion identified. Sinuses/Orbits: The visualized paranasal sinuses and mastoid air cells are clear. The orbits and globes intact. Other: None IMPRESSION: Acute/subacute infarct  involving the right occipital lobe. Findings consistent with age related atrophy and chronic small vessel ischemia Electronically Signed   By: Prudencio Pair M.D.   On: 09/10/2020 23:27   CT ANGIO HEAD CODE STROKE  Result Date: 09/11/2020 CLINICAL DATA:  Aphasia EXAM: CT ANGIOGRAPHY HEAD AND NECK TECHNIQUE: Multidetector CT imaging of the head and neck was performed using the standard protocol during bolus administration of intravenous contrast. Multiplanar CT image reconstructions and MIPs were obtained to evaluate the vascular anatomy. Carotid stenosis measurements (when applicable) are obtained utilizing NASCET criteria, using the distal internal carotid diameter as the denominator. CONTRAST:  73mL OMNIPAQUE IOHEXOL 350 MG/ML SOLN COMPARISON:  None. FINDINGS: CTA NECK FINDINGS SKELETON: There is no bony spinal canal stenosis. No lytic or blastic lesion. OTHER NECK: Normal pharynx, larynx and major salivary  glands. No cervical lymphadenopathy. Unremarkable thyroid gland. UPPER CHEST: No pneumothorax or pleural effusion. No nodules or masses. AORTIC ARCH: There is mild calcific atherosclerosis of the aortic arch. There is no aneurysm, dissection or hemodynamically significant stenosis of the visualized portion of the aorta. Conventional 3 vessel aortic branching pattern. The visualized proximal subclavian arteries are widely patent. RIGHT CAROTID SYSTEM: No dissection, occlusion or aneurysm. Mild atherosclerotic calcification at the carotid bifurcation without hemodynamically significant stenosis. LEFT CAROTID SYSTEM: No dissection, occlusion or aneurysm. Mild atherosclerotic calcification at the carotid bifurcation without hemodynamically significant stenosis. VERTEBRAL ARTERIES: Codominant configuration. Both origins are clearly patent. There is no dissection, occlusion or flow-limiting stenosis to the skull base (V1-V3 segments). CTA HEAD FINDINGS POSTERIOR CIRCULATION: --Vertebral arteries: Normal V4  segments. --Inferior cerebellar arteries: Normal. --Basilar artery: Normal. --Superior cerebellar arteries: Normal. --Posterior cerebral arteries (PCA): Normal. ANTERIOR CIRCULATION: --Intracranial internal carotid arteries: Atherosclerotic calcification of the internal carotid arteries at the skull base without hemodynamically significant stenosis. --Anterior cerebral arteries (ACA): Normal. Both A1 segments are present. Patent anterior communicating artery (a-comm). --Middle cerebral arteries (MCA): Normal. VENOUS SINUSES: As permitted by contrast timing, patent. ANATOMIC VARIANTS: None Review of the MIP images confirms the above findings. IMPRESSION: 1. No emergent large vessel occlusion or high-grade stenosis of the intracranial arteries. 2. Mild bilateral carotid bifurcation atherosclerosis without hemodynamically significant stenosis by NASCET criteria. Aortic Atherosclerosis (ICD10-I70.0). Electronically Signed   By: Ulyses Jarred M.D.   On: 09/11/2020 00:30   CT ANGIO NECK CODE STROKE  Result Date: 09/11/2020 CLINICAL DATA:  Aphasia EXAM: CT ANGIOGRAPHY HEAD AND NECK TECHNIQUE: Multidetector CT imaging of the head and neck was performed using the standard protocol during bolus administration of intravenous contrast. Multiplanar CT image reconstructions and MIPs were obtained to evaluate the vascular anatomy. Carotid stenosis measurements (when applicable) are obtained utilizing NASCET criteria, using the distal internal carotid diameter as the denominator. CONTRAST:  47mL OMNIPAQUE IOHEXOL 350 MG/ML SOLN COMPARISON:  None. FINDINGS: CTA NECK FINDINGS SKELETON: There is no bony spinal canal stenosis. No lytic or blastic lesion. OTHER NECK: Normal pharynx, larynx and major salivary glands. No cervical lymphadenopathy. Unremarkable thyroid gland. UPPER CHEST: No pneumothorax or pleural effusion. No nodules or masses. AORTIC ARCH: There is mild calcific atherosclerosis of the aortic arch. There is no  aneurysm, dissection or hemodynamically significant stenosis of the visualized portion of the aorta. Conventional 3 vessel aortic branching pattern. The visualized proximal subclavian arteries are widely patent. RIGHT CAROTID SYSTEM: No dissection, occlusion or aneurysm. Mild atherosclerotic calcification at the carotid bifurcation without hemodynamically significant stenosis. LEFT CAROTID SYSTEM: No dissection, occlusion or aneurysm. Mild atherosclerotic calcification at the carotid bifurcation without hemodynamically significant stenosis. VERTEBRAL ARTERIES: Codominant configuration. Both origins are clearly patent. There is no dissection, occlusion or flow-limiting stenosis to the skull base (V1-V3 segments). CTA HEAD FINDINGS POSTERIOR CIRCULATION: --Vertebral arteries: Normal V4 segments. --Inferior cerebellar arteries: Normal. --Basilar artery: Normal. --Superior cerebellar arteries: Normal. --Posterior cerebral arteries (PCA): Normal. ANTERIOR CIRCULATION: --Intracranial internal carotid arteries: Atherosclerotic calcification of the internal carotid arteries at the skull base without hemodynamically significant stenosis. --Anterior cerebral arteries (ACA): Normal. Both A1 segments are present. Patent anterior communicating artery (a-comm). --Middle cerebral arteries (MCA): Normal. VENOUS SINUSES: As permitted by contrast timing, patent. ANATOMIC VARIANTS: None Review of the MIP images confirms the above findings. IMPRESSION: 1. No emergent large vessel occlusion or high-grade stenosis of the intracranial arteries. 2. Mild bilateral carotid bifurcation atherosclerosis without hemodynamically significant stenosis by NASCET criteria. Aortic Atherosclerosis (ICD10-I70.0).  Electronically Signed   By: Ulyses Jarred M.D.   On: 09/11/2020 00:30    EKG: Independently reviewed. Sinus rhythm without acute ischemic changes, PACs present.  Assessment/Plan Principal Problem:   Acute CVA (cerebrovascular accident)  [facility]) Active Problems:   Essential hypertension   Hypercholesterolemia   Paroxysmal atrial fibrillation ([facility])   Anemia   Gastric cancer ([facility])  Roy David is a 86 y.o. male with medical history significant for recently diagnosed gastric carcinoma undergoing radiation treatment, paroxysmal atrial fibrillation not on AC due to GI bleed, HTN, HLD, hypothyroidism, asthma, anemia, CKD stage III, who is admitted with acute CVA.  Acute/subacute infarct of right occipital lobe: Seen on CT head without contrast.  Admission for further work-up recommended by neurology. -MRI brain without contrast -CTA head/neck and CT cerebral perfusion studies ordered by neurology -Echocardiogram -Start aspirin 81 mg daily, monitor closely for recurrent GI bleeding -Allow permissive hypertension for now -Check A1c, lipid panel -PT/OT/SLP eval -Monitor on telemetry, neurochecks  Paroxysmal atrial fibrillation: In sinus rhythm on admission.  Not on anticoagulation due to recent GI bleeding.  Started on aspirin as above.  Gastric cancer: Undergoing radiation therapy.  Had recent complication of GI bleeding.  Hemoglobin currently stable. -Continue Protonix and sucralfate -Monitor for further GI bleeding with aspirin use  CKD stage III: Chronic and relatively stable.  Continue monitor.  Hypothyroidism: Continue Synthroid.  Hyperlipidemia: Continue Zetia-simvastatin.  Hypertension: No longer on antihypertensives.  Allowing permissive hypertension for now.  Asthma: Stable.  Continue albuterol as needed.  DVT prophylaxis: SCDs given risk of GI bleeding Code Status: Full code, confirmed with patient Family Communication: Discussed with patient spouse at bedside Disposition Plan: From home, dispo pending further stroke work-up and PT/OT/SLP eval Consults called: Neurology Admission status:  Status is: Observation  The patient remains OBS appropriate and will d/c before 2 midnights.  Dispo: The  patient is from: Home              Anticipated d/c is to: Home              Anticipated d/c date is: 1 day              Patient currently is not medically stable to d/c.   Zada Finders MD [facility]  If 7PM-7AM, please contact night-coverage [email or web address]  09/11/2020, 12:33 AM

## 2020-09-10 NOTE — Consult Note (Signed)
NEUROLOGY CONSULTATION NOTE   Date of service: September 10, 2020 Patient Name: Roy David MRN:  [id number] DOB:  05-Apr-1930 Reason for consult: "stroke on [facility]"  History of Present Illness  Roy David is a 86 y.o. male with PMH significant for asthma, TIA, gastric cancer c/b significant GI bleed on  radiation, HTN, HLD, prior hx of CRAO of the Right eye, Pafib not on AC due to GI Bleed who has had a decline in functional status since early September. Symptoms started back in sept as dizziness. Was sent to hospital on 9/11 with SOB and almost passing out and found to have a GI bleed and a GI cancer for which he has been on radiation.  He has been lethargic and tired since discharge from hospital. Has not been eating well, is having trouble with swallowing. Trouble with his vision since then and not been moving his RUE as much as LUE since 9/13.  Today, he appeared very withdrawn all day. He had grand children over and would interact with them very little. Has been sitting in a chair all day with his head bent over. Wife called fireman and they note that his speech was garbled and he could not get it out. He did not know where he was when EMT asked hime. This has resolved since and he has been talking fine now.  There was some initial concern that his symptoms started today at 5pm specially with speech problems. He was out of tPA window and not a candidate anyways given his GI tumor with known significant bleeding. But he is now back to hise baseline. A STAT CT Angio was obtained to ensure no LVO.  Per wife, se has been requiring significant help with dressing, showering over the last couple of weeks.  NIHSS: 7 for Left hemianopsia, LIC questions incorrect, moderate aphasia. Premorbid mRS: 4 TPA: he had a recent significant GI bleed, was out of window at presentation. Thrombectomy: no LVO.   ROS   Constitutional Denies weight loss, fever and chills.  HEENT Endorses problems with vision but not  with hearing.   Respiratory Denies SOB and cough.  CV Denies palpitations and CP  GI Denies abdominal pain, nausea, vomiting and diarrhea.  GU Denies dysuria and urinary frequency.  MSK Denies myalgia and joint pain.  Skin Denies rash and pruritus.  Neurological Denies headache and syncope.  Psychiatric Denies recent changes in mood. Denies anxiety and depression.   Past History   Past Medical History:  Diagnosis Date   Arthritis    Asthma    followed at [facility], stable on Asmanex   Diverticulosis    Dysrhythmia    Gastric cancer ([place]) 08/25/2020   Goals of care, counseling/discussion 08/25/2020   H/O seasonal allergies    Dr [name]   Hypercholesteremia    Hypertension    Hypochromic-microcytic anemia    Microcytic anemia    Paroxysmal atrial fibrillation ([place]) 03/06/2016   on Xarelto   Prostate cancer ([place])    12/10- Dr Dahlstedt/Dr Valere Dross   Thyroid disease    hypothyroidism   Past Surgical History:  Procedure Laterality Date   APPENDECTOMY     BIOPSY  08/21/2020   Procedure: BIOPSY;  Surgeon: Ronnette Juniper, MD;  Location: [place];  Service: Gastroenterology;;   COLONOSCOPY N/A 01/05/2020   Procedure: COLONOSCOPY;  Surgeon: Clarene Essex, MD;  Location: [facility] ENDOSCOPY;  Service: Endoscopy;  Laterality: N/A;   EP IMPLANTABLE DEVICE N/A 11/29/2015   Procedure: Loop Recorder Insertion;  Surgeon:  Sanda Klein, MD;  Location: [place] CV LAB;  Service: Cardiovascular;  Laterality: N/A;   ESOPHAGOGASTRODUODENOSCOPY (EGD) WITH PROPOFOL N/A 08/21/2020   Procedure: ESOPHAGOGASTRODUODENOSCOPY (EGD) WITH PROPOFOL;  Surgeon: Ronnette Juniper, MD;  Location: [place];  Service: Gastroenterology;  Laterality: N/A;   HERNIA REPAIR     RETINAL DETACHMENT SURGERY     Family History  Problem Relation Age of Onset   Heart attack Mother    Stroke Father    Stroke Sister    Social History   Socioeconomic History   Marital status: Married    Spouse name: Not  on file   Number of children: Not on file   Years of education: Not on file   Highest education level: Not on file  Occupational History   Occupation: retired  Tobacco Use   Smoking status: Former Smoker    Quit date: 12/09/1959    Years since quitting: 60.7   Smokeless tobacco: Never Used  Scientific laboratory technician Use: Never used  Substance and Sexual Activity   Alcohol use: Yes    Alcohol/week: 14.0 standard drinks    Types: 7 Shots of liquor, 7 Cans of beer per week   Drug use: No   Sexual activity: Not on file  Other Topics Concern   Not on file  Social History Narrative   Tobacco use cigarettes : Former smoker, Quit in year 1955   Occupation : retired/  married   Social Determinants of Radio broadcast assistant Strain:    Difficulty of Paying Living Expenses: Not on file  Food Insecurity:    Worried About Charity fundraiser in the Last Year: Not on file   [place] in the Last Year: Not on file  Transportation Needs:    Film/video editor (Medical): Not on file   Lack of Transportation (Non-Medical): Not on file  Physical Activity:    Days of Exercise per Week: Not on file   Minutes of Exercise per Session: Not on file  Stress:    Feeling of Stress : Not on file  Social Connections:    Frequency of Communication with Friends and Family: Not on file   Frequency of Social Gatherings with Friends and Family: Not on file   Attends Religious Services: Not on file   Active Member of Clubs or Organizations: Not on file   Attends Archivist Meetings: Not on file   Marital Status: Not on file   Allergies  Allergen Reactions   Bee Venom Anaphylaxis    Wasp and yellow jackets    Medications  (Not in a hospital admission)    Vitals   Vitals:   09/10/20 2300 09/10/20 2315 09/10/20 2330 09/10/20 2345  BP: 135/75 (!) 155/74 136/78 (!) 144/76  Pulse: 85 88 86 84  Resp: (!) 21 (!) 27 (!) 23 20  Temp:      TempSrc:       SpO2: 100% 95% 97% 100%  Weight:      Height:         Body mass index is 22.48 kg/m.  Physical Exam   General: Laying comfortably in bed; in no acute distress. HENT: Normal oropharynx and mucosa. Normal external appearance of ears and nose. Neck: Supple, no pain or tenderness CV: No JVD. No peripheral edema. Pulmonary: Symmetric Chest rise. Normal respiratory effort. Abdomen: Soft to touch, non-tender. Ext: No cyanosis, edema, or deformity Skin: No rash. Normal palpation of skin.  Musculoskeletal:  Normal digits and nails by inspection. No clubbing.  Neurologic Examination  Mental status/Cognition: Alert, oriented to self, place but not to month and year, poor attention. Speech/language: Fluent, comprehension intact, able to name a pen only Cranial nerves:   CN II Pupils equal and reactive to light, L hemianopsia.   CN III,IV,VI EOM intact, no gaze preference or deviation, no nystagmus   CN V normal sensation in V1, V2, and V3 segments bilaterally   CN VII no asymmetry, no nasolabial fold flattening   CN VIII normal hearing to speech   CN IX & X normal palatal elevation, no uvular deviation   CN XI 5/5 head turn and 5/5 shoulder shrug bilaterally   CN XII midline tongue protrusion   Motor:  Muscle bulk: normal, tone normal, pronator drift none tremor none Mvmt Root Nerve  Muscle Right Left Comments  SA C5/6 Ax Deltoid 5 5   EF C5/6 Mc Biceps 5 5   EE C6/7/8 Rad Triceps 5 5   WF C6/7 Med FCR 5 5   WE C7/8 PIN ECU 5 5   F Ab C8/T1 U ADM/FDI 5 5   HF L1/2/3 Fem Illopsoas 5 5   KE L2/3/4 Fem Quad 5 5   DF L4/5 D Peron Tib Ant 5 5   PF S1/2 Tibial Grc/Sol 5 5    Reflexes:  Right Left Comments  Pectoralis      Biceps (C5/6) 1 1   Brachioradialis (C5/6) 2 2    Triceps (C6/7) 2 2    Patellar (L3/4) 3 3    Achilles (S1) 2 2    Hoffman      Plantar mute mute   Jaw jerk    Sensation:  Light touch Intact throughout   Pin prick    Temperature    Vibration    Proprioception    Coordination/Complex Motor:  - Finger to Nose unable to test 2/2 vision.  Labs   CBC:  Recent Labs  Lab 09/10/20 2205  WBC 6.1  HGB 9.1*  HCT 29.3*  MCV 91.6  PLT 094    Basic Metabolic Panel:  Lab Results  Component Value Date   NA 138 09/10/2020   K 4.1 09/10/2020   CO2 22 09/10/2020   GLUCOSE 100 (H) 09/10/2020   BUN 20 09/10/2020   CREATININE 1.51 (H) 09/10/2020   CALCIUM 9.2 09/10/2020   GFRNONAA 41 (L) 09/10/2020   GFRAA 47 (L) 09/10/2020   Lipid Panel: No results found for: LDLCALC HgbA1c: No results found for: HGBA1C Urine Drug Screen: No results found for: LABOPIA, COCAINSCRNUR, LABBENZ, AMPHETMU, THCU, LABBARB  Alcohol Level No results found for: [facility]   Imaging and Diagnostic studies  CT Head Wo Contrast Acute/subacute infarct involving the right occipital lobe.  CTA: No LVO.   Impression   Roy David is a 86 y.o. male with PMH significant for  for asthma, TIA, gastric cancer c/b significant GI bleed on  radiation, HTN, HLD, prior hx of CRAO of the Right eye, Pafib not on AC due to GI Bleed who has had a decline in functional status since early September and problems with visoin. His neurologic examination is notable for L hemianopsia with encephalopathy and concern for aphasia. CTH with an acute/subacute R occipital stroke.  Some initial concerns that symptoms may have been acute onset today but on further discussion with wife, he has had problems with vision for a couple of weeks and did have an episode of garbled speech today  which resolved and he is talking fine now. Some difficulty assessing for aphasia given problems with vision.  Recommendations  - Recommend brain imaging with MRI Brain without contrast - Recommend obtaining TTE - Recommend obtaining Lipid panel with LDL - Please start statin if LDL > 70 - Recommend HbA1c - Antithrombotic - Continue Aspirin 81mg . - Recommend DVT ppx - SBP goal - gradual normotension. -  Recommend Telemetry monitoring for arrythmia - Recommend bedside swallow screen prior to PO intake. - Stroke education booklet - Recommend PT/OT/SLP consult  ______________________________________________________________________   Thank you for the opportunity to take part in the care of this patient. If you have any further questions, please contact the neurology consultation attending.  Signed,  [place] Pager Number [phone]

## 2020-09-10 NOTE — ED Notes (Signed)
Pt CBG 91 at this time.

## 2020-09-11 ENCOUNTER — Other Ambulatory Visit (HOSPITAL_COMMUNITY): Payer: Medicare Other

## 2020-09-11 ENCOUNTER — Observation Stay (HOSPITAL_COMMUNITY): Payer: Medicare Other

## 2020-09-11 DIAGNOSIS — I361 Nonrheumatic tricuspid (valve) insufficiency: Secondary | ICD-10-CM | POA: Diagnosis not present

## 2020-09-11 DIAGNOSIS — Z66 Do not resuscitate: Secondary | ICD-10-CM | POA: Diagnosis not present

## 2020-09-11 DIAGNOSIS — I480 Paroxysmal atrial fibrillation: Secondary | ICD-10-CM | POA: Diagnosis present

## 2020-09-11 DIAGNOSIS — I350 Nonrheumatic aortic (valve) stenosis: Secondary | ICD-10-CM | POA: Diagnosis not present

## 2020-09-11 DIAGNOSIS — I639 Cerebral infarction, unspecified: Secondary | ICD-10-CM | POA: Diagnosis present

## 2020-09-11 DIAGNOSIS — M1990 Unspecified osteoarthritis, unspecified site: Secondary | ICD-10-CM | POA: Diagnosis present

## 2020-09-11 DIAGNOSIS — D631 Anemia in chronic kidney disease: Secondary | ICD-10-CM | POA: Diagnosis present

## 2020-09-11 DIAGNOSIS — I69391 Dysphagia following cerebral infarction: Secondary | ICD-10-CM | POA: Diagnosis not present

## 2020-09-11 DIAGNOSIS — N183 Chronic kidney disease, stage 3 unspecified: Secondary | ICD-10-CM | POA: Diagnosis not present

## 2020-09-11 DIAGNOSIS — I352 Nonrheumatic aortic (valve) stenosis with insufficiency: Secondary | ICD-10-CM | POA: Diagnosis present

## 2020-09-11 DIAGNOSIS — R569 Unspecified convulsions: Secondary | ICD-10-CM | POA: Diagnosis not present

## 2020-09-11 DIAGNOSIS — I6389 Other cerebral infarction: Secondary | ICD-10-CM | POA: Diagnosis not present

## 2020-09-11 DIAGNOSIS — C169 Malignant neoplasm of stomach, unspecified: Secondary | ICD-10-CM

## 2020-09-11 DIAGNOSIS — J45909 Unspecified asthma, uncomplicated: Secondary | ICD-10-CM | POA: Diagnosis present

## 2020-09-11 DIAGNOSIS — E860 Dehydration: Secondary | ICD-10-CM | POA: Diagnosis not present

## 2020-09-11 DIAGNOSIS — R1310 Dysphagia, unspecified: Secondary | ICD-10-CM | POA: Diagnosis present

## 2020-09-11 DIAGNOSIS — N1832 Chronic kidney disease, stage 3b: Secondary | ICD-10-CM | POA: Diagnosis present

## 2020-09-11 DIAGNOSIS — R4182 Altered mental status, unspecified: Secondary | ICD-10-CM | POA: Diagnosis present

## 2020-09-11 DIAGNOSIS — E876 Hypokalemia: Secondary | ICD-10-CM | POA: Diagnosis not present

## 2020-09-11 DIAGNOSIS — E43 Unspecified severe protein-calorie malnutrition: Secondary | ICD-10-CM | POA: Diagnosis present

## 2020-09-11 DIAGNOSIS — I129 Hypertensive chronic kidney disease with stage 1 through stage 4 chronic kidney disease, or unspecified chronic kidney disease: Secondary | ICD-10-CM | POA: Diagnosis present

## 2020-09-11 DIAGNOSIS — R64 Cachexia: Secondary | ICD-10-CM | POA: Diagnosis not present

## 2020-09-11 DIAGNOSIS — I63532 Cerebral infarction due to unspecified occlusion or stenosis of left posterior cerebral artery: Secondary | ICD-10-CM | POA: Diagnosis present

## 2020-09-11 DIAGNOSIS — E7800 Pure hypercholesterolemia, unspecified: Secondary | ICD-10-CM | POA: Diagnosis present

## 2020-09-11 DIAGNOSIS — E785 Hyperlipidemia, unspecified: Secondary | ICD-10-CM

## 2020-09-11 DIAGNOSIS — Z20822 Contact with and (suspected) exposure to covid-19: Secondary | ICD-10-CM | POA: Diagnosis present

## 2020-09-11 DIAGNOSIS — Z515 Encounter for palliative care: Secondary | ICD-10-CM | POA: Diagnosis not present

## 2020-09-11 DIAGNOSIS — H5347 Heteronymous bilateral field defects: Secondary | ICD-10-CM | POA: Diagnosis present

## 2020-09-11 DIAGNOSIS — E039 Hypothyroidism, unspecified: Secondary | ICD-10-CM

## 2020-09-11 DIAGNOSIS — I69398 Other sequelae of cerebral infarction: Secondary | ICD-10-CM | POA: Diagnosis not present

## 2020-09-11 DIAGNOSIS — I69328 Other speech and language deficits following cerebral infarction: Secondary | ICD-10-CM | POA: Diagnosis not present

## 2020-09-11 DIAGNOSIS — I6523 Occlusion and stenosis of bilateral carotid arteries: Secondary | ICD-10-CM | POA: Diagnosis not present

## 2020-09-11 DIAGNOSIS — Z7189 Other specified counseling: Secondary | ICD-10-CM | POA: Diagnosis not present

## 2020-09-11 DIAGNOSIS — C168 Malignant neoplasm of overlapping sites of stomach: Secondary | ICD-10-CM | POA: Diagnosis not present

## 2020-09-11 DIAGNOSIS — I10 Essential (primary) hypertension: Secondary | ICD-10-CM

## 2020-09-11 LAB — HEMOGLOBIN A1C
Hgb A1c MFr Bld: 4.6 % — ABNORMAL LOW (ref 4.8–5.6)
Mean Plasma Glucose: 85.32 mg/dL

## 2020-09-11 LAB — ECHOCARDIOGRAM COMPLETE
AR max vel: 0.74 cm2
AV Area VTI: 0.76 cm2
AV Area mean vel: 0.71 cm2
AV Mean grad: 32.7 mmHg
AV Peak grad: 57.9 mmHg
Ao pk vel: 3.81 m/s
Area-P 1/2: 3.65 cm2
Height: 68.5 in
P 1/2 time: 225 msec
S' Lateral: 3 cm
Weight: 2400 oz

## 2020-09-11 LAB — LIPID PANEL
Cholesterol: 134 mg/dL (ref 0–200)
HDL: 34 mg/dL — ABNORMAL LOW (ref >40)
LDL Cholesterol: 78 mg/dL (ref 0–99)
Total CHOL/HDL Ratio: 3.9 RATIO
Triglycerides: 108 mg/dL (ref <150)
VLDL: 22 mg/dL (ref 0–40)

## 2020-09-11 LAB — RESPIRATORY PANEL BY RT PCR (FLU A&B, COVID)
Influenza A by PCR: NEGATIVE
Influenza B by PCR: NEGATIVE
SARS Coronavirus 2 by RT PCR: NEGATIVE

## 2020-09-11 MED ORDER — ASPIRIN EC 81 MG PO TBEC
81.0000 mg | DELAYED_RELEASE_TABLET | Freq: Every day | ORAL | Status: DC
  Administered 2020-09-11 – 2020-09-14 (×4): 81 mg via ORAL
  Filled 2020-09-11 (×4): qty 1

## 2020-09-11 MED ORDER — SUCRALFATE 1 GM/10ML PO SUSP
1.0000 g | Freq: Three times a day (TID) | ORAL | Status: DC
  Administered 2020-09-11 – 2020-09-19 (×23): 1 g via ORAL
  Filled 2020-09-11 (×31): qty 10

## 2020-09-11 MED ORDER — SENNOSIDES-DOCUSATE SODIUM 8.6-50 MG PO TABS: 1.0000 | ORAL_TABLET | Freq: Every evening | ORAL | Status: DC | PRN

## 2020-09-11 MED ORDER — DEXTROSE IN LACTATED RINGERS 5 % IV SOLN: INTRAVENOUS | Status: DC

## 2020-09-11 MED ORDER — ACETAMINOPHEN 160 MG/5ML PO SOLN: 650.0000 mg | ORAL | Status: DC | PRN

## 2020-09-11 MED ORDER — ACETAMINOPHEN 325 MG PO TABS
650.0000 mg | ORAL_TABLET | ORAL | Status: DC | PRN
  Administered 2020-09-14: 650 mg via ORAL
  Filled 2020-09-11: qty 2

## 2020-09-11 MED ORDER — SIMVASTATIN 20 MG PO TABS
20.0000 mg | ORAL_TABLET | Freq: Every day | ORAL | Status: DC
  Administered 2020-09-11 – 2020-09-13 (×3): 20 mg via ORAL
  Filled 2020-09-11 (×3): qty 1

## 2020-09-11 MED ORDER — STROKE: EARLY STAGES OF RECOVERY BOOK
Freq: Once | Status: AC
  Filled 2020-09-11: qty 1

## 2020-09-11 MED ORDER — ALBUTEROL SULFATE HFA 108 (90 BASE) MCG/ACT IN AERS: 1.0000 | INHALATION_SPRAY | Freq: Four times a day (QID) | RESPIRATORY_TRACT | Status: DC | PRN

## 2020-09-11 MED ORDER — LEVOTHYROXINE SODIUM 100 MCG PO TABS
100.0000 ug | ORAL_TABLET | Freq: Every day | ORAL | Status: DC
  Administered 2020-09-11 – 2020-09-14 (×4): 100 ug via ORAL
  Filled 2020-09-11 (×4): qty 1

## 2020-09-11 MED ORDER — EZETIMIBE 10 MG PO TABS
10.0000 mg | ORAL_TABLET | Freq: Every day | ORAL | Status: DC
  Administered 2020-09-11 – 2020-09-14 (×4): 10 mg via ORAL
  Filled 2020-09-11 (×4): qty 1

## 2020-09-11 MED ORDER — PANTOPRAZOLE SODIUM 40 MG PO TBEC
40.0000 mg | DELAYED_RELEASE_TABLET | Freq: Two times a day (BID) | ORAL | Status: DC
  Administered 2020-09-11 – 2020-09-20 (×20): 40 mg via ORAL
  Filled 2020-09-11 (×20): qty 1

## 2020-09-11 MED ORDER — EZETIMIBE-SIMVASTATIN 10-20 MG PO TABS
1.0000 | ORAL_TABLET | Freq: Every evening | ORAL | Status: DC
  Filled 2020-09-11 (×2): qty 1

## 2020-09-11 MED ORDER — IOHEXOL 350 MG/ML SOLN
60.0000 mL | Freq: Once | INTRAVENOUS | Status: AC | PRN
  Administered 2020-09-11: 60 mL via INTRAVENOUS

## 2020-09-11 MED ORDER — ACETAMINOPHEN 650 MG RE SUPP
650.0000 mg | RECTAL | Status: DC | PRN
  Administered 2020-09-13: 650 mg via RECTAL
  Filled 2020-09-11: qty 1

## 2020-09-11 MED ORDER — GABAPENTIN 300 MG PO CAPS
300.0000 mg | ORAL_CAPSULE | Freq: Every day | ORAL | Status: DC
  Administered 2020-09-11 – 2020-09-19 (×9): 300 mg via ORAL
  Filled 2020-09-11 (×10): qty 1

## 2020-09-11 MED ORDER — TETRAHYDROZOLINE HCL 0.05 % OP SOLN
1.0000 [drp] | Freq: Two times a day (BID) | OPHTHALMIC | Status: DC
  Administered 2020-09-11 – 2020-09-19 (×14): 1 [drp] via OPHTHALMIC
  Filled 2020-09-11 (×2): qty 15

## 2020-09-11 NOTE — ED Notes (Signed)
Patient transported to MRI 

## 2020-09-11 NOTE — Progress Notes (Signed)
  Echocardiogram 2D Echocardiogram has been performed.  Roy David 09/11/2020, 8:50 AM

## 2020-09-11 NOTE — Evaluation (Signed)
Occupational Therapy Evaluation Patient Details Name: Roy David MRN: [id number] DOB: September 08, 1930 Today's Date: 09/11/2020    History of Present Illness Pt is an 86 y/o male admitted secondary to AMS. Found to have watershed infarcts in bilateral cerebral and cerebellar hemispheres and R occiptal lobe. PMH includes a fib, CKD, gastric cancer, asthma, a fib, and GI bleed.    Clinical Impression   PTA pt living with family at home, requiring assist for ADL/mobility since most recent hospitalization. At time of eval, pt able to complete bed mobility and sit <> stands with mod A +2. Noted increased R sided weakness and sensory deficits (mostly in UE). Also noted visual deficits largely in left visual field, but testing was also inconsistent in R visual field. Pt also presents with cognitive deficits in orientation, attention, safety, awareness, and problem solving. He often times states "I think so" to answers or appears not to understand basic commands. Wife present for education, unsure if she understands details surrounding d/c plan. Given current status, recommend SNF at d/c for continued progression of ADL and safety prior to returning home. Will continue to follow per POC listed below.      Follow Up Recommendations  SNF;Supervision/Assistance - 24 hour    Equipment Recommendations  Wheelchair (measurements OT);Wheelchair cushion (measurements OT);Hospital bed    Recommendations for Other Services       Precautions / Restrictions Precautions Precautions: Fall Precaution Comments: visual deficits Restrictions Weight Bearing Restrictions: No      Mobility Bed Mobility Overal bed mobility: Needs Assistance Bed Mobility: Supine to Sit;Sit to Supine     Supine to sit: Mod assist;+2 for physical assistance Sit to supine: Mod assist;+2 for physical assistance   General bed mobility comments: Mod A +2 for trunk assist and LE assist throughout. Also required assist to scoot hips to  EOB.   Transfers Overall transfer level: Needs assistance Equipment used: 2 person hand held assist Transfers: Sit to/from Stand Sit to Stand: Mod assist;+2 physical assistance         General transfer comment: Mod A +2 for lift assist and steadying throughout. Cues for upright posture. Reliant on BUE support     Balance Overall balance assessment: Needs assistance Sitting-balance support: No upper extremity supported;Feet supported Sitting balance-Leahy Scale: Fair     Standing balance support: Bilateral upper extremity supported;During functional activity Standing balance-Leahy Scale: Poor Standing balance comment: Reliant on UE and external support                            ADL either performed or assessed with clinical judgement   ADL Overall ADL's : Needs assistance/impaired Eating/Feeding: Minimal assistance;Sitting   Grooming: Minimal assistance;Sitting   Upper Body Bathing: Moderate assistance;Sitting   Lower Body Bathing: Maximal assistance;Sitting/lateral leans;Sit to/from stand   Upper Body Dressing : Moderate assistance;Sitting   Lower Body Dressing: Maximal assistance;Sitting/lateral leans;Sit to/from stand   Toilet Transfer: Moderate assistance;+2 for physical assistance;+2 for safety/equipment;Stand-pivot;BSC;Cueing for safety;Cueing for sequencing   Toileting- Clothing Manipulation and Hygiene: Maximal assistance;Total assistance;Sitting/lateral lean;Sit to/from stand Toileting - Clothing Manipulation Details (indicate cue type and reason): pt wears briefs at baseline     Functional mobility during ADLs: Moderate assistance;+2 for physical assistance;+2 for safety/equipment (side steps up to [facility] only)       Vision Baseline Vision/History: Wears glasses Wears Glasses: At all times Patient Visual Report: Blurring of vision;Peripheral vision impairment Vision Assessment?: Yes Tracking/Visual Pursuits: Decreased smoothness of eye  movement  to LEFT superior field;Decreased smoothness of eye movement to LEFT inferior field;Decreased smoothness of eye movement to RIGHT inferior field;Decreased smoothness of eye movement to RIGHT superior field;Impaired - to be further tested in functional context;Requires cues, head turns, or add eye shifts to track Visual Fields: Left visual field deficit;Impaired-to be further tested in functional context Additional Comments: more noticeable L sided visual field deficits, but pt not consistent in identifying objects in R sided visual field. Suspect cognitive deficits also at play     Perception     Praxis      Pertinent Vitals/Pain Pain Assessment: No/denies pain     Hand Dominance     Extremity/Trunk Assessment Upper Extremity Assessment Upper Extremity Assessment: RUE deficits/detail;Difficult to assess due to impaired cognition RUE Deficits / Details: overall weakness and poor motor control. Suspect proprioceptive deficits but difficult to test given cognitive status RUE Coordination: decreased fine motor;decreased gross motor   Lower Extremity Assessment Lower Extremity Assessment: Defer to PT evaluation RLE Deficits / Details: Decreased sensation from the knee down bilaterally. Was unable to tell when I was touching either leg below the knee.  LLE Deficits / Details: Decreased sensation from the knee down bilaterally. Was unable to tell when I was touching either leg below the knee.    Cervical / Trunk Assessment Cervical / Trunk Assessment: Kyphotic   Communication Communication Communication: HOH   Cognition Arousal/Alertness: Awake/alert Behavior During Therapy: WFL for tasks assessed/performed Overall Cognitive Status: Impaired/Different from baseline Area of Impairment: Orientation;Memory;Attention;Following commands;Safety/judgement;Awareness;Problem solving                 Orientation Level: Disoriented to;Place;Time;Situation Current Attention Level:  Focused Memory: Decreased short-term memory Following Commands: Follows one step commands with increased time Safety/Judgement: Decreased awareness of deficits;Decreased awareness of safety Awareness: Intellectual Problem Solving: Slow processing;Decreased initiation;Difficulty sequencing;Requires verbal cues;Requires tactile cues General Comments: Pt responding to most questions with "I think so". Was confabulatory when asked to perform tasks. Increased time and cues to perform basic mobility tasks. Largely unaware of current situation.    General Comments  Pt's wife present during session. Will need follow up education on d/c recommendations as wife seemed to have difficulty comprehending discharge plan.     Exercises     Shoulder Instructions      Home Living Family/patient expects to be discharged to:: Private residence Living Arrangements: Spouse/significant other Available Help at Discharge: Family Type of Home: House Home Access: Stairs to enter [employer] of Steps: 2   Home Layout: One level     Bathroom Shower/Tub: Walk-in shower;Tub/shower unit   [employer]: Standard     Home Equipment: Civil engineer, contracting;Toilet riser;Walker - 2 wheels          Prior Functioning/Environment Level of Independence: Needs assistance  Gait / Transfers Assistance Needed: Using RW for mobility. As of recently needed assist for all ADL ADL's / Homemaking Assistance Needed: Son assists with bathing. Wife and family manages all IADLs            OT Problem List: Decreased strength;Decreased knowledge of use of DME or AE;Impaired vision/perception;Decreased range of motion;Decreased coordination;Decreased activity tolerance;Decreased cognition;Impaired UE functional use;Impaired balance (sitting and/or standing);Decreased safety awareness      OT Treatment/Interventions: Self-care/ADL training;Therapeutic exercise;Patient/family education;Visual/perceptual  remediation/compensation;Balance training;Neuromuscular education;Energy conservation;Therapeutic activities;Cognitive remediation/compensation;DME and/or AE instruction    OT Goals(Current goals can be found in the care plan section) Acute Rehab OT Goals Patient Stated Goal: none stated OT Goal Formulation: Patient unable  to participate in goal setting Time For Goal Achievement: 09/25/20 Potential to Achieve Goals: Good  OT Frequency: Min 2X/week   Barriers to D/C:            Co-evaluation PT/OT/SLP Co-Evaluation/Treatment: Yes Reason for Co-Treatment: Necessary to address cognition/behavior during functional activity;For patient/therapist safety;To address functional/ADL transfers PT goals addressed during session: Mobility/safety with mobility;Balance OT goals addressed during session: ADL's and self-care;Strengthening/ROM      AM-PAC OT "6 Clicks" Daily Activity     Outcome Measure Help from another person eating meals?: A Little Help from another person taking care of personal grooming?: A Little Help from another person toileting, which includes using toliet, bedpan, or urinal?: A Lot Help from another person bathing (including washing, rinsing, drying)?: A Lot Help from another person to put on and taking off regular upper body clothing?: A Little Help from another person to put on and taking off regular lower body clothing?: A Lot 6 Click Score: 15   End of Session Equipment Utilized During Treatment: Gait belt Nurse Communication: Mobility status  Activity Tolerance: Patient tolerated treatment well Patient left: in bed;with call bell/phone within reach;with family/visitor present  OT Visit Diagnosis: Unsteadiness on feet (R26.81);Other abnormalities of gait and mobility (R26.89);Muscle weakness (generalized) (M62.81);Other symptoms and signs involving cognitive function;Hemiplegia and hemiparesis;Low vision, both eyes (H54.2) Hemiplegia - Right/Left: Right Hemiplegia  - dominant/non-dominant: Dominant Hemiplegia - caused by: Cerebral infarction                Time: 6967-8938 OT Time Calculation (min): 31 min Charges:  OT General Charges $OT Visit: 1 Visit OT Evaluation $OT Eval Moderate Complexity: Ivalee, MSOT, OTR/L Acute Rehabilitation Services [facility] Office Number: [phone] Pager: [phone]  Zenovia Jarred 09/11/2020, 5:40 PM

## 2020-09-11 NOTE — Progress Notes (Addendum)
Patient ID: Roy David, male   DOB: 03-Nov-1930, 86 y.o.   MRN: [id number]  PROGRESS NOTE    Nguyen Todorov  RCV:[id number] DOB: Nov 19, 1930 DOA: 09/10/2020 PCP: [name], L.Marlou Sa, MD    Brief Narrative:  Roy David is a 86 y.o. male with medical history significant for recently diagnosed gastric carcinoma undergoing radiation treatment (has had 7 of 15 treatments), paroxysmal atrial fibrillation not on AC due to recent GI bleed (admission 9/11-9/14 for same, status post 3 units PRBCs), HTN, HLD, hypothyroidism, asthma, anemia, CKD stage III, who presents to the ED for evaluation of altered mental status.  According to the wife he had a 16 pound weight loss over the past 2 weeks, he has had very poor p.o. intake which really got worse after his radiation started.  He reports poor appetite.  In the ED he was noted to have had multiple acute versus subacute strokes.  Neurology was consulted and work-up began.   Assessment & Plan:   Principal Problem:   Acute CVA (cerebrovascular accident) [facility]) Active Problems:   Essential hypertension   Hypercholesterolemia   Paroxysmal atrial fibrillation (HCC)   Anemia   Gastric cancer (HCC)   CVA (cerebral vascular accident) ([place])  Acute/subacute infarct of right occipital lobe: Seen on CT head without contrast.  Admission for further work-up recommended by neurology. -MRI brain reveals bilateral extensive watershed infarcts in the cerebral and cerebellar hemispheres as well as generalized atrophy and chronic small vessel ischemic changes. -CTA head/neck and CT cerebral perfusion studies ordered by neurology revealed no large vessel occlusion or high-grade stenosis -Echocardiogram reveals moderate to severe aortic stenosis however he is unlikely to be a surgical candidate at this point. -Start aspirin 81 mg daily, monitor closely for recurrent GI bleeding -Allow permissive hypertension for now -Check A1c, lipid panel which were normal he is on a  statin -PT/OT/SLP eval-asked speech would give some diet.  PT for first SNF -Monitor on telemetry, neurochecks  Paroxysmal atrial fibrillation: In sinus rhythm on admission.  Not on anticoagulation due to recent GI bleeding.  Started on aspirin as above.  Gastric cancer: Undergoing radiation therapy.  Had recent complication of GI bleeding.  Hemoglobin currently stable. -Continue Protonix and sucralfate -Monitor for further GI bleeding with aspirin use -We will give some IV fluids as he is quite dry based on weight loss -His wife reports that palliative care did come see them.  CKD stage III: Chronic and relatively stable.  Continue monitor.  Hypothyroidism: Continue Synthroid.  Hyperlipidemia: Continue Zetia-simvastatin.  Per neurology may increase dosing as LDL is 78 and not less than 70  Hypertension: No longer on antihypertensives.  Allowing permissive hypertension for now.  Asthma: Stable.  Continue albuterol as needed.  Aortic stenosis: Moderate to severe with a mean gradient of 32 which is mildly changed from previous echo in March. Discussed with Dr. Domenic Polite of cardiology who suggested outpatient follow-up and does not think a TAVR would be in the patient's best interest given his ongoing GI bleeding and ultimate prognosis.  DVT prophylaxis: SCD/Compression stockings Code Status: Full code  Family Communication: Patient and spouse at bedside Disposition Plan: None  Patient remains inpatient for ongoing work-up, unsafe discharge plan   Consultants:   Neurology  Procedures:  2D echo  Antimicrobials: Anti-infectives (From admission, onward)   None       Subjective: Feels fine.  Speaks when spoken to.  No focal deficits noted.  Objective: Vitals:   09/11/20 1500 09/11/20 1530 09/11/20 1600 09/11/20  1638  BP: (!) 142/68 121/77 133/65 (!) 146/75  Pulse: 68 84 76 72  Resp: 17 (!) 21 17 16   Temp:    98.5 F (36.9 C)  TempSrc:    Oral  SpO2:  99% 98% 97% 100%  Weight:      Height:       No intake or output data in the 24 hours ending 09/11/20 1641 Filed Weights   09/10/20 1935  Weight: 68 kg    Examination:  General exam: Appears calm and comfortable  Respiratory system: Clear to auscultation. Respiratory effort normal. Cardiovascular system: S1 & S2 heard, RRR.  3/6 holosystolic murmur Gastrointestinal system: Abdomen is nondistended, soft and nontender.  Central nervous system: Alert and without focal neurological deficits. Extremities: Symmetric  Skin: No rashes Psychiatry: Judgement and insight appear normal. Mood & affect appropriate.     Data Reviewed: I have personally reviewed following labs and imaging studies  CBC: Recent Labs  Lab 09/10/20 2205  WBC 6.1  HGB 9.1*  HCT 29.3*  MCV 91.6  PLT 144   Basic Metabolic Panel: Recent Labs  Lab 09/10/20 2205  NA 138  K 4.1  CL 105  CO2 22  GLUCOSE 100*  BUN 20  CREATININE 1.51*  CALCIUM 9.2   GFR: Estimated Creatinine Clearance: 32.5 mL/min (A) (by C-G formula based on SCr of 1.51 mg/dL (H)). Liver Function Tests: Recent Labs  Lab 09/10/20 2205  AST 39  ALT 13  ALKPHOS 57  BILITOT 1.1  PROT 5.8*  ALBUMIN 2.8*   Coagulation Profile: Recent Labs  Lab 09/10/20 2205  INR 1.1   HbA1C: Recent Labs    09/11/20 0448  HGBA1C 4.6*   Lipid Profile: Recent Labs    09/11/20 0449  CHOL 134  HDL 34*  LDLCALC 78  TRIG 108  CHOLHDL 3.9     Recent Results (from the past 240 hour(s))  Respiratory Panel by RT PCR (Flu A&B, Covid) - Nasopharyngeal Swab     Status: None   Collection Time: 09/10/20 11:51 PM   Specimen: Nasopharyngeal Swab  Result Value Ref Range Status   SARS Coronavirus 2 by RT PCR NEGATIVE NEGATIVE Final    Comment: (NOTE) SARS-CoV-2 target nucleic acids are NOT DETECTED.  The SARS-CoV-2 RNA is generally detectable in upper respiratoy specimens during the acute phase of infection. The lowest concentration of  SARS-CoV-2 viral copies this assay can detect is 131 copies/mL. A negative result does not preclude SARS-Cov-2 infection and should not be used as the sole basis for treatment or other patient management decisions. A negative result may occur with  improper specimen collection/handling, submission of specimen other than nasopharyngeal swab, presence of viral mutation(s) within the areas targeted by this assay, and inadequate number of viral copies (<131 copies/mL). A negative result must be combined with clinical observations, patient history, and epidemiological information. The expected result is Negative.  Fact Sheet for Patients:  PinkCheek.be  Fact Sheet for Healthcare Providers:  GravelBags.it  This test is no t yet approved or cleared by the [place] FDA and  has been authorized for detection and/or diagnosis of SARS-CoV-2 by FDA under an Emergency Use Authorization (EUA). This EUA will remain  in effect (meaning this test can be used) for the duration of the COVID-19 declaration under Section 564(b)(1) of the Act, 21 U.S.C. section 360bbb-3(b)(1), unless the authorization is terminated or revoked sooner.     Influenza A by PCR NEGATIVE NEGATIVE Final   Influenza B by  PCR NEGATIVE NEGATIVE Final    Comment: (NOTE) The Xpert Xpress SARS-CoV-2/FLU/RSV assay is intended as an aid in  the diagnosis of influenza from Nasopharyngeal swab specimens and  should not be used as a sole basis for treatment. Nasal washings and  aspirates are unacceptable for Xpert Xpress SARS-CoV-2/FLU/RSV  testing.  Fact Sheet for Patients: PinkCheek.be  Fact Sheet for Healthcare Providers: GravelBags.it  This test is not yet approved or cleared by the [place] FDA and  has been authorized for detection and/or diagnosis of SARS-CoV-2 by  FDA under an Emergency Use  Authorization (EUA). This EUA will remain  in effect (meaning this test can be used) for the duration of the  Covid-19 declaration under Section 564(b)(1) of the Act, 21  U.S.C. section 360bbb-3(b)(1), unless the authorization is  terminated or revoked. Performed at [facility], [place] [address] [place], [place] [address]       Radiology Studies: CT Head Wo Contrast  Result Date: 09/10/2020 CLINICAL DATA:  Mental status change EXAM: CT HEAD WITHOUT CONTRAST TECHNIQUE: Contiguous axial images were obtained from the base of the skull through the vertex without intravenous contrast. COMPARISON:  August 21, 2020 FINDINGS: Brain: There is an area of hypodensity seen along the posterior right occipital lobe with loss of gray-white differentiation. No mass effect or midline shift. Prior lacunar infarct seen within the right basal ganglia. There is dilatation the ventricles and sulci consistent with age-related atrophy. Low-attenuation changes in the deep white matter consistent with small vessel ischemia. Vascular: No hyperdense vessel or unexpected calcification. Skull: The skull is intact. No fracture or focal lesion identified. Sinuses/Orbits: The visualized paranasal sinuses and mastoid air cells are clear. The orbits and globes intact. Other: None IMPRESSION: Acute/subacute infarct involving the right occipital lobe. Findings consistent with age related atrophy and chronic small vessel ischemia Electronically Signed   By: Prudencio Pair M.D.   On: 09/10/2020 23:27   MR BRAIN WO CONTRAST  Result Date: 09/11/2020 CLINICAL DATA:  Stroke follow-up EXAM: MRI HEAD WITHOUT CONTRAST TECHNIQUE: Multiplanar, multiecho pulse sequences of the brain and surrounding structures were obtained without intravenous contrast. COMPARISON:  CTA head neck 09/11/2020 FINDINGS: Brain: There are bilateral watershed zone infarcts affecting both cerebral and cerebellar hemispheres. Largest confluent area of ischemia is  in the right occipital lobe. Multifocal hyperintense T2-weighted signal within the white matter. There is generalized atrophy without lobar predilection. Multiple foci of chronic hemorrhage in the lateral right cerebellum. Normal midline structures. Vascular: Normal flow voids. Skull and upper cervical spine: Normal marrow signal. Sinuses/Orbits: Negative.  Bilateral scleral bands. Other: None IMPRESSION: 1. 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 Largest confluent area of ischemia is in the right occipital lobe. 2. No acute hemorrhage or mass effect. 3. Generalized atrophy and chronic small vessel ischemic changes. Electronically Signed   By: Ulyses Jarred M.D.   On: 09/11/2020 02:37   ECHOCARDIOGRAM COMPLETE  Result Date: 09/11/2020    ECHOCARDIOGRAM REPORT   Patient Name:   KONRAD HOAK Date of Exam: 09/11/2020 Medical Rec #:  [id number]    Height:       68.5 in Accession #:    [id number]   Weight:       150.0 lb Date of Birth:  19-Apr-1930     BSA:          1.818 m Patient Age:    34 years     BP:  140/71 mmHg Patient Gender: M            HR:           80 bpm. Exam Location:  Inpatient Procedure: 2D Echo, Cardiac Doppler and Color Doppler Indications:    Stroke  History:        Patient has prior history of Echocardiogram examinations, most                 recent 02/29/2020. Aortic Valve Disease; Risk                 Factors:Dyslipidemia. CKD.  Sonographer:    Clayton Lefort RDCS (AE) Referring Phys: [id number] Cleaster Corin PATEL  Sonographer Comments: Technically challenging study due to patient movement. IMPRESSIONS  1. Left ventricular ejection fraction, by estimation, is 60 to 65%. The left ventricle has normal function. The left ventricle has no regional wall motion abnormalities. There is severe left ventricular hypertrophy. Left ventricular diastolic parameters  are consistent with Grade I diastolic dysfunction (impaired relaxation).  2. Right  ventricular systolic function is normal. The right ventricular size is normal.  3. The mitral valve is normal in structure. Trivial mitral valve regurgitation. No evidence of mitral stenosis. Moderate mitral annular calcification.  4. The aortic valve is abnormal. There is severe calcifcation of the aortic valve. There is severe thickening of the aortic valve. Aortic valve regurgitation is moderate. Moderate to severe aortic valve stenosis. Aortic valve mean gradient measures 32.7  mmHg. Aortic valve peak gradient measures 57.9 mmHg. Aortic valve area, by VTI measures 0.76 cm.  5. The inferior vena cava is normal in size with greater than 50% respiratory variability, suggesting right atrial pressure of 3 mmHg. FINDINGS  Left Ventricle: Left ventricular ejection fraction, by estimation, is 60 to 65%. The left ventricle has normal function. The left ventricle has no regional wall motion abnormalities. The left ventricular internal cavity size was normal in size. There is  severe left ventricular hypertrophy. Left ventricular diastolic parameters are consistent with Grade I diastolic dysfunction (impaired relaxation). Normal left ventricular filling pressure. Right Ventricle: The right ventricular size is normal. No increase in right ventricular wall thickness. Right ventricular systolic function is normal. Left Atrium: Left atrial size was normal in size. Right Atrium: Right atrial size was normal in size. Pericardium: There is no evidence of pericardial effusion. Mitral Valve: The mitral valve is normal in structure. There is moderate thickening of the mitral valve leaflet(s). There is moderate calcification of the mitral valve leaflet(s). Moderate mitral annular calcification. Trivial mitral valve regurgitation.  No evidence of mitral valve stenosis. Tricuspid Valve: The tricuspid valve is normal in structure. Tricuspid valve regurgitation is not demonstrated. No evidence of tricuspid stenosis. Aortic Valve: The  aortic valve is abnormal. There is severe calcifcation of the aortic valve. There is severe thickening of the aortic valve. There is severe aortic valve annular calcification. Aortic valve regurgitation is moderate. Aortic regurgitation  PHT measures 225 msec. Moderate to severe aortic stenosis is present. Aortic valve mean gradient measures 32.7 mmHg. Aortic valve peak gradient measures 57.9 mmHg. Aortic valve area, by VTI measures 0.76 cm. Pulmonic Valve: The pulmonic valve was not well visualized. Pulmonic valve regurgitation is not visualized. No evidence of pulmonic stenosis. Aorta: The aortic root is normal in size and structure. Pulmonary Artery: Indeterminant PASP, inadequate TR jet. Venous: The inferior vena cava is normal in size with greater than 50% respiratory variability, suggesting right atrial pressure of 3 mmHg. IAS/Shunts: No atrial  level shunt detected by color flow Doppler.  LEFT VENTRICLE PLAX 2D LVIDd:         4.50 cm  Diastology LVIDs:         3.00 cm  LV e' medial:    4.79 cm/s LV PW:         1.50 cm  LV E/e' medial:  10.6 LV IVS:        1.50 cm  LV e' lateral:   6.74 cm/s LVOT diam:     2.00 cm  LV E/e' lateral: 7.6 LV SV:         63 LV SV Index:   34 LVOT Area:     3.14 cm  RIGHT VENTRICLE            IVC RV Basal diam:  3.40 cm    IVC diam: 0.80 cm RV S prime:     9.14 cm/s TAPSE (M-mode): 2.1 cm LEFT ATRIUM           Index       RIGHT ATRIUM           Index LA diam:      2.40 cm 1.32 cm/m  RA Area:     16.40 cm LA Vol (A2C): 38.8 ml 21.34 ml/m RA Volume:   41.50 ml  22.82 ml/m LA Vol (A4C): 40.0 ml 22.00 ml/m  AORTIC VALVE AV Area (Vmax):    0.74 cm AV Area (Vmean):   0.71 cm AV Area (VTI):     0.76 cm AV Vmax:           380.52 cm/s AV Vmean:          269.609 cm/s AV VTI:            0.820 m AV Peak Grad:      57.9 mmHg AV Mean Grad:      32.7 mmHg LVOT Vmax:         89.10 cm/s LVOT Vmean:        60.750 cm/s LVOT VTI:          0.199 m LVOT/AV VTI ratio: 0.24 AI PHT:            225  msec  AORTA Ao Root diam: 3.70 cm MITRAL VALVE MV Area (PHT): 3.65 cm    SHUNTS MV Decel Time: 208 msec    Systemic VTI:  0.20 m MV E velocity: 50.90 cm/s  Systemic Diam: 2.00 cm MV A velocity: 89.20 cm/s MV E/A ratio:  0.57 Carlyle Dolly MD Electronically signed by Carlyle Dolly MD Signature Date/Time: 09/11/2020/9:32:47 AM    Final    CT ANGIO HEAD CODE STROKE  Result Date: 09/11/2020 CLINICAL DATA:  Aphasia EXAM: CT ANGIOGRAPHY HEAD AND NECK TECHNIQUE: Multidetector CT imaging of the head and neck was performed using the standard protocol during bolus administration of intravenous contrast. Multiplanar CT image reconstructions and MIPs were obtained to evaluate the vascular anatomy. Carotid stenosis measurements (when applicable) are obtained utilizing NASCET criteria, using the distal internal carotid diameter as the denominator. CONTRAST:  55mL OMNIPAQUE IOHEXOL 350 MG/ML SOLN COMPARISON:  None. FINDINGS: CTA NECK FINDINGS SKELETON: There is no bony spinal canal stenosis. No lytic or blastic lesion. OTHER NECK: Normal pharynx, larynx and major salivary glands. No cervical lymphadenopathy. Unremarkable thyroid gland. UPPER CHEST: No pneumothorax or pleural effusion. No nodules or masses. AORTIC ARCH: There is mild calcific atherosclerosis of the aortic arch. There is no aneurysm, dissection or hemodynamically significant stenosis of the  visualized portion of the aorta. Conventional 3 vessel aortic branching pattern. The visualized proximal subclavian arteries are widely patent. RIGHT CAROTID SYSTEM: No dissection, occlusion or aneurysm. Mild atherosclerotic calcification at the carotid bifurcation without hemodynamically significant stenosis. LEFT CAROTID SYSTEM: No dissection, occlusion or aneurysm. Mild atherosclerotic calcification at the carotid bifurcation without hemodynamically significant stenosis. VERTEBRAL ARTERIES: Codominant configuration. Both origins are clearly patent. There is no  dissection, occlusion or flow-limiting stenosis to the skull base (V1-V3 segments). CTA HEAD FINDINGS POSTERIOR CIRCULATION: --Vertebral arteries: Normal V4 segments. --Inferior cerebellar arteries: Normal. --Basilar artery: Normal. --Superior cerebellar arteries: Normal. --Posterior cerebral arteries (PCA): Normal. ANTERIOR CIRCULATION: --Intracranial internal carotid arteries: Atherosclerotic calcification of the internal carotid arteries at the skull base without hemodynamically significant stenosis. --Anterior cerebral arteries (ACA): Normal. Both A1 segments are present. Patent anterior communicating artery (a-comm). --Middle cerebral arteries (MCA): Normal. VENOUS SINUSES: As permitted by contrast timing, patent. ANATOMIC VARIANTS: None Review of the MIP images confirms the above findings. IMPRESSION: 1. No emergent large vessel occlusion or high-grade stenosis of the intracranial arteries. 2. Mild bilateral carotid bifurcation atherosclerosis without hemodynamically significant stenosis by NASCET criteria. Aortic Atherosclerosis (ICD10-I70.0). Electronically Signed   By: Ulyses Jarred M.D.   On: 09/11/2020 00:30   CT ANGIO NECK CODE STROKE  Result Date: 09/11/2020 CLINICAL DATA:  Aphasia EXAM: CT ANGIOGRAPHY HEAD AND NECK TECHNIQUE: Multidetector CT imaging of the head and neck was performed using the standard protocol during bolus administration of intravenous contrast. Multiplanar CT image reconstructions and MIPs were obtained to evaluate the vascular anatomy. Carotid stenosis measurements (when applicable) are obtained utilizing NASCET criteria, using the distal internal carotid diameter as the denominator. CONTRAST:  75mL OMNIPAQUE IOHEXOL 350 MG/ML SOLN COMPARISON:  None. FINDINGS: CTA NECK FINDINGS SKELETON: There is no bony spinal canal stenosis. No lytic or blastic lesion. OTHER NECK: Normal pharynx, larynx and major salivary glands. No cervical lymphadenopathy. Unremarkable thyroid gland. UPPER  CHEST: No pneumothorax or pleural effusion. No nodules or masses. AORTIC ARCH: There is mild calcific atherosclerosis of the aortic arch. There is no aneurysm, dissection or hemodynamically significant stenosis of the visualized portion of the aorta. Conventional 3 vessel aortic branching pattern. The visualized proximal subclavian arteries are widely patent. RIGHT CAROTID SYSTEM: No dissection, occlusion or aneurysm. Mild atherosclerotic calcification at the carotid bifurcation without hemodynamically significant stenosis. LEFT CAROTID SYSTEM: No dissection, occlusion or aneurysm. Mild atherosclerotic calcification at the carotid bifurcation without hemodynamically significant stenosis. VERTEBRAL ARTERIES: Codominant configuration. Both origins are clearly patent. There is no dissection, occlusion or flow-limiting stenosis to the skull base (V1-V3 segments). CTA HEAD FINDINGS POSTERIOR CIRCULATION: --Vertebral arteries: Normal V4 segments. --Inferior cerebellar arteries: Normal. --Basilar artery: Normal. --Superior cerebellar arteries: Normal. --Posterior cerebral arteries (PCA): Normal. ANTERIOR CIRCULATION: --Intracranial internal carotid arteries: Atherosclerotic calcification of the internal carotid arteries at the skull base without hemodynamically significant stenosis. --Anterior cerebral arteries (ACA): Normal. Both A1 segments are present. Patent anterior communicating artery (a-comm). --Middle cerebral arteries (MCA): Normal. VENOUS SINUSES: As permitted by contrast timing, patent. ANATOMIC VARIANTS: None Review of the MIP images confirms the above findings. IMPRESSION: 1. No emergent large vessel occlusion or high-grade stenosis of the intracranial arteries. 2. Mild bilateral carotid bifurcation atherosclerosis without hemodynamically significant stenosis by NASCET criteria. Aortic Atherosclerosis (ICD10-I70.0). Electronically Signed   By: Ulyses Jarred M.D.   On: 09/11/2020 00:30     Scheduled  Meds: . aspirin EC  81 mg Oral Daily  . ezetimibe-simvastatin  1 tablet Oral QPM  . gabapentin  300 mg Oral Daily  . levothyroxine  100 mcg Oral Q0600  . pantoprazole  40 mg Oral BID  . sucralfate  1 g Oral TID  . tetrahydrozoline  1 drop Both Eyes BID   Continuous Infusions:   LOS: 0 days    Donnamae Jude, MD 09/11/2020 4:41 PM [phone] [facility] If 7PM-7AM, please contact night-coverage 09/11/2020, 4:41 PM

## 2020-09-11 NOTE — Progress Notes (Addendum)
STROKE TEAM PROGRESS NOTE   INTERVAL HISTORY His wife is at the bedside.  Pt lying in bed, mildly lethargic, paucity of speech but orientated to place, people month, but not to year. Not in distress. Wife told me that this morning he was given ASA 81 but vomited. Not sure if he is able to tolerate ASA. He has PAF and followed by Dr. Sallyanne Kuster, was on Xarelto and eliquis both discontinued in the past due to GIB. Wife also told me that since discharge from hospital 9/13, pt has been declining, difficulty walking, had falls at home. Yesterday became very lethargic and not himself. Today seems better, more responding. He has outpt palliative care following.    OBJECTIVE Vitals:   09/11/20 0330 09/11/20 0400 09/11/20 0530 09/11/20 0600  BP: (!) 151/83 (!) 152/86 (!) 143/76 135/86  Pulse: 81 82 77 80  Resp: 18 18 17  (!) 21  Temp:      TempSrc:      SpO2: 98% 98% 99% 99%  Weight:      Height:        CBC:  Recent Labs  Lab 09/10/20 2205  WBC 6.1  HGB 9.1*  HCT 29.3*  MCV 91.6  PLT 448    Basic Metabolic Panel:  Recent Labs  Lab 09/10/20 2205  NA 138  K 4.1  CL 105  CO2 22  GLUCOSE 100*  BUN 20  CREATININE 1.51*  CALCIUM 9.2    Lipid Panel:     Component Value Date/Time   CHOL 134 09/11/2020 0449   TRIG 108 09/11/2020 0449   HDL 34 (L) 09/11/2020 0449   CHOLHDL 3.9 09/11/2020 0449   VLDL 22 09/11/2020 0449   LDLCALC 78 09/11/2020 0449   HgbA1c:  Lab Results  Component Value Date   HGBA1C 4.6 (L) 09/11/2020   Urine Drug Screen: No results found for: LABOPIA, COCAINSCRNUR, LABBENZ, AMPHETMU, THCU, LABBARB  Alcohol Level No results found for: [facility]  IMAGING  CT Head Wo Contrast 09/10/2020 IMPRESSION:  Acute/subacute infarct involving the right occipital lobe. Findings consistent with age related atrophy and chronic small vessel ischemia   MR BRAIN WO CONTRAST 09/11/2020 IMPRESSION:  1. Extensive bilateral watershed zone infarcts affecting both cerebral and  cerebellar hemispheres. Largest confluent area of ischemia is in the right occipital lobe.  2. No acute hemorrhage or mass effect.  3. Generalized atrophy and chronic small vessel ischemic changes.   CT ANGIO HEAD CODE STROKE CT ANGIO NECK CODE STROKE 09/11/2020 IMPRESSION:  1. No emergent large vessel occlusion or high-grade stenosis of the intracranial arteries.  2. Mild bilateral carotid bifurcation atherosclerosis without hemodynamically significant stenosis by NASCET criteria.  Aortic Atherosclerosis (ICD10-I70.0).   Transthoracic Echocardiogram  1. Left ventricular ejection fraction, by estimation, is 60 to 65%. The  left ventricle has normal function. The left ventricle has no regional  wall motion abnormalities. There is severe left ventricular hypertrophy.  Left ventricular diastolic parameters  are consistent with Grade I diastolic dysfunction (impaired relaxation).  2. Right ventricular systolic function is normal. The right ventricular  size is normal.  3. The mitral valve is normal in structure. Trivial mitral valve  regurgitation. No evidence of mitral stenosis. Moderate mitral annular  calcification.  4. The aortic valve is abnormal. There is severe calcifcation of the  aortic valve. There is severe thickening of the aortic valve. Aortic valve  regurgitation is moderate. Moderate to severe aortic valve stenosis.  Aortic valve mean gradient measures 32.7  mmHg. Aortic  valve peak gradient measures 57.9 mmHg. Aortic valve area,  by VTI measures 0.76 cm.  5. The inferior vena cava is normal in size with greater than 50%  respiratory variability, suggesting right atrial pressure of 3 mmHg.   ECG - SR rate 86 BPM. (See cardiology reading for complete details)   PHYSICAL EXAM  Temp:  [98.6 F (37 C)] 98.6 F (37 C) (10/02 1935) Pulse Rate:  [70-93] 82 (10/03 1330) Resp:  [11-33] 19 (10/03 1330) BP: (121-155)/(69-98) 135/84 (10/03 1330) SpO2:  [94 %-100 %] 95  % (10/03 1330) Weight:  [68 kg] 68 kg (10/02 1935)  General - Well nourished, well developed, in no apparent distress, mildly lethargic.  Ophthalmologic - fundi not visualized due to noncooperation.  Cardiovascular - Regular rhythm and rate.  Neuro - mildly lethargic, eyes open, orientated to place but tell me "[place]", orientated to people, and month, but not year. Paucity of speech, but no dysphagia, able to name 3/3, repeat 3-word sentence but not 5-word sentence. Following simple commands. Left hemianopia, not blinking to visual threat on the left. Right gaze preference, left gaze incomplete but cross midline. No facial droop, tongue midline. BUE 4/5 no drift and BLE 2/5 with pain. DTR 1+ and no babinski. FTN not cooperative. Sensation symmetrical subjectively. Gait not tested.    ASSESSMENT/PLAN Roy David is a 86 y.o. male with history of  asthma, TIA, gastric cancer c/b significant GI bleed on  radiation, HTN, HLD, prior hx of CRAO of the Right eye, Pafib not on AC due to GI Bleed, loop recorder implant 2016,  who has had a decline in functional status since early September presenting with lethargy, AMS, speech difficulties,left hemianopsia, dysphagia and RUE weakness. He did not receive IV t-PA due to late presentation (>4.5 hours from time of onset) and a hx of recent Gi bleed.  Stroke: Extensive watershed infarcts bilateral anterior and posterior circulations as well as left PCA infarct, more subacute based on images, likely due to hypoperfusion with severe anemia vs. PAF not on AC  CT head - Acute/subacute infarct involving the right occipital lobe.   MRI head - Extensive bilateral watershed zone infarcts affecting both cerebral and cerebellar hemispheres. Largest confluent area of ischemia is in the right occipital lobe.    CTA H&N - No emergent large vessel occlusion or high-grade stenosis of the intracranial arteries. Mild bilateral carotid bifurcation atherosclerosis  without hemodynamically significant stenosis   2D Echo - EF 60-65%, moderate to severe AS  EEG pending  [employer] Virus 2 - negative  LDL - 78  HgbA1c - 4.6  VTE prophylaxis - SCDs  No antithrombotic prior to admission, now on aspirin 81 mg daily. OK to discontinue if pt not able to tolerate ASA.   Ongoing stroke risk factor management  Therapy recommendations:  pending  Disposition:  Pending  GIB with GI tumor  GIB 12/2019 but colonoscopy showed only diverticolusis - off Eliquis  Admitted for symptomatic anemia in 08/2020, found to have GI malignancy  Following with oncology, on radiation  Has home palliative care on board  On ASA 81 now - wife reporting not tolerate this am  OK to d/c ASA if not tolerating for the next several days   Severe anemia  Hb 6.4 on 08/22/20 received PRBC  This admission, Hb 9.1  CBC monitoring  PAF   Followed by Dr. Sallyanne Kuster  Off Xarelto and Eliquis due to GIB  Currently NSR  On ASA 81  Hyperlipidemia  Home Lipid lowering medication: Vytorin 10/20  LDL 78, goal < 70  Current lipid lowering medication: Vytorin 10/20  Continue statin at discharge  Other Stroke Risk Factors  Advanced age  Former cigarette smoker - quit  Family hx stroke (father and sister)   Other Active Problems  Code status - Full code   NPO - speech on board CKD - stage 3b - creatinine - 1.51  Hospital day # 0  I spent  35 minutes in total face-to-face time with the patient, more than 50% of which was spent in counseling and coordination of care, reviewing test results, images and medication, and discussing the diagnosis, treatment plan and potential prognosis. This patient's care requiresreview of multiple databases, neurological assessment, discussion with family, other specialists and medical decision making of high complexity. I had long discussion with wife at bedside, updated pt current condition, treatment plan and potential prognosis,  and answered all the questions. She expressed understanding and appreciation.   Rosalin Hawking, MD PhD Stroke Neurology 09/11/2020 3:27 PM   To contact Stroke Continuity provider, please refer to [email or web address] After hours, contact General Neurology

## 2020-09-11 NOTE — ED Notes (Signed)
Pt's brief and bed changed with no issue.

## 2020-09-11 NOTE — Evaluation (Signed)
Physical Therapy Evaluation Patient Details Name: Roy David MRN: [id number] DOB: [birth year] Today's Date: 09/11/2020   History of Present Illness  Pt is an 86 y/o male admitted secondary to AMS. Found to have watershed infarcts in bilateral cerebral and cerebellar hemispheres and R occiptal lobe. PMH includes a fib, CKD, gastric cancer, asthma, a fib, and GI bleed.   Clinical Impression  Pt presenting with problem above and deficits below. Pt requiring mod  A+2 to perform bed mobility tasks and to stand and take side steps at EOB. Pt with cognitive deficits below and had difficulty sequencing to perform mobility tasks. Per wife pt has had 1 recent fall and has been having difficulty ambulating. Feel he would benefit from SNF level therapies at d/c to increase independence with mobility tasks. Will continue to follow acutely.     Follow Up Recommendations SNF;Supervision/Assistance - 24 hour    Equipment Recommendations  None recommended by PT    Recommendations for Other Services       Precautions / Restrictions Precautions Precautions: Fall Precaution Comments: Fall in january and one about 3 weeks ago Restrictions Weight Bearing Restrictions: No      Mobility  Bed Mobility Overal bed mobility: Needs Assistance Bed Mobility: Supine to Sit;Sit to Supine     Supine to sit: Mod assist;+2 for physical assistance Sit to supine: Mod assist;+2 for physical assistance   General bed mobility comments: Mod A +2 for trunk assist and LE assist throughout. Also required assist to scoot hips to EOB.   Transfers Overall transfer level: Needs assistance Equipment used: 2 person hand held assist Transfers: Sit to/from Stand Sit to Stand: Mod assist;+2 physical assistance         General transfer comment: Mod A +2 for lift assist and steadying throughout. Cues for upright posture. Reliant on BUE support   Ambulation/Gait Ambulation/Gait assistance: Mod assist;+2 physical  assistance   Assistive device: 2 person hand held assist       General Gait Details: Took side steps at EOB. Required cues for sequencing to take steps towards HOB. Decreased coordination noted.   Stairs            Wheelchair Mobility    Modified Rankin (Stroke Patients Only) Modified Rankin (Stroke Patients Only) Pre-Morbid Rankin Score: Moderate disability Modified Rankin: Moderately severe disability     Balance Overall balance assessment: Needs assistance Sitting-balance support: No upper extremity supported;Feet supported Sitting balance-Leahy Scale: Fair     Standing balance support: Bilateral upper extremity supported;During functional activity Standing balance-Leahy Scale: Poor Standing balance comment: Reliant on UE and external support                              Pertinent Vitals/Pain Pain Assessment: No/denies pain    Home Living Family/patient expects to be discharged to:: Private residence Living Arrangements: Spouse/significant other Available Help at Discharge: Family Type of Home: House Home Access: Stairs to enter   Technical brewer of Steps: 2 Home Layout: One level Home Equipment: Shower seat;Toilet riser;Walker - 2 wheels      Prior Function Level of Independence: Needs assistance   Gait / Transfers Assistance Needed: Using RW for mobility   ADL's / Homemaking Assistance Needed: Son assists with bathing         Hand Dominance        Extremity/Trunk Assessment   Upper Extremity Assessment Upper Extremity Assessment: Defer to OT evaluation    Lower  Extremity Assessment Lower Extremity Assessment: RLE deficits/detail;LLE deficits/detail;Generalized weakness RLE Deficits / Details: Decreased sensation from the knee down bilaterally. Was unable to tell when I was touching either leg below the knee.  LLE Deficits / Details: Decreased sensation from the knee down bilaterally. Was unable to tell when I was touching  either leg below the knee.     Cervical / Trunk Assessment Cervical / Trunk Assessment: Kyphotic  Communication   Communication: HOH  Cognition Arousal/Alertness: Awake/alert Behavior During Therapy: WFL for tasks assessed/performed Overall Cognitive Status: Impaired/Different from baseline Area of Impairment: Orientation;Memory;Attention;Following commands;Safety/judgement;Awareness;Problem solving                 Orientation Level: Disoriented to;Place;Time;Situation Current Attention Level: Focused Memory: Decreased short-term memory Following Commands: Follows one step commands with increased time Safety/Judgement: Decreased awareness of deficits;Decreased awareness of safety Awareness: Intellectual Problem Solving: Slow processing;Decreased initiation;Difficulty sequencing;Requires verbal cues;Requires tactile cues General Comments: Pt responding to most questions with "I think so". Was confabulatory when asked to perform tasks. Increased time and cues to perform basic mobility tasks. Largely unaware of current situation.       General Comments General comments (skin integrity, edema, etc.): Pt's wife present during session. Will need follow up education on d/c recommendations as wife seemed to have difficulty comprehending discharge plan.     Exercises     Assessment/Plan    PT Assessment Patient needs continued PT services  PT Problem List Decreased strength;Decreased balance;Decreased mobility;Decreased coordination;Decreased cognition;Decreased knowledge of use of DME;Decreased safety awareness;Decreased knowledge of precautions       PT Treatment Interventions DME instruction;Gait training;Stair training;Therapeutic activities;Functional mobility training;Therapeutic exercise;Balance training;Patient/family education;Cognitive remediation    PT Goals (Current goals can be found in the Care Plan section)  Acute Rehab PT Goals Patient Stated Goal: none stated PT  Goal Formulation: With patient Time For Goal Achievement: 09/25/20 Potential to Achieve Goals: Good    Frequency Min 3X/week   Barriers to discharge        Co-evaluation PT/OT/SLP Co-Evaluation/Treatment: Yes Reason for Co-Treatment: Complexity of the patient's impairments (multi-system involvement);For patient/therapist safety;To address functional/ADL transfers PT goals addressed during session: Mobility/safety with mobility;Balance         AM-PAC PT "6 Clicks" Mobility  Outcome Measure Help needed turning from your back to your side while in a flat bed without using bedrails?: A Lot Help needed moving from lying on your back to sitting on the side of a flat bed without using bedrails?: A Lot Help needed moving to and from a bed to a chair (including a wheelchair)?: A Lot Help needed standing up from a chair using your arms (e.g., wheelchair or bedside chair)?: A Lot Help needed to walk in hospital room?: A Lot Help needed climbing 3-5 steps with a railing? : Total 6 Click Score: 11    End of Session Equipment Utilized During Treatment: Gait belt Activity Tolerance: Patient tolerated treatment well Patient left: in bed;with call bell/phone within reach (on stretcher on ED ) Nurse Communication: Mobility status PT Visit Diagnosis: Muscle weakness (generalized) (M62.81);Unsteadiness on feet (R26.81);Difficulty in walking, not elsewhere classified (R26.2);History of falling (Z91.81)    Time: 7673-4193 PT Time Calculation (min) (ACUTE ONLY): 32 min   Charges:   PT Evaluation $PT Eval Moderate Complexity: 1 Mod          Roy David, PT, DPT  Acute Rehabilitation Services  Pager: [phone] Office: [phone]   Roy David 09/11/2020, 3:09 PM

## 2020-09-12 ENCOUNTER — Ambulatory Visit: Payer: Medicare Other

## 2020-09-12 ENCOUNTER — Inpatient Hospital Stay (HOSPITAL_COMMUNITY): Payer: Medicare Other

## 2020-09-12 DIAGNOSIS — R569 Unspecified convulsions: Secondary | ICD-10-CM

## 2020-09-12 DIAGNOSIS — I639 Cerebral infarction, unspecified: Secondary | ICD-10-CM | POA: Diagnosis not present

## 2020-09-12 DIAGNOSIS — E43 Unspecified severe protein-calorie malnutrition: Secondary | ICD-10-CM | POA: Insufficient documentation

## 2020-09-12 DIAGNOSIS — R4182 Altered mental status, unspecified: Secondary | ICD-10-CM

## 2020-09-12 DIAGNOSIS — I480 Paroxysmal atrial fibrillation: Secondary | ICD-10-CM | POA: Diagnosis not present

## 2020-09-12 LAB — URINALYSIS, ROUTINE W REFLEX MICROSCOPIC
Bilirubin Urine: NEGATIVE
Glucose, UA: NEGATIVE mg/dL
Hgb urine dipstick: NEGATIVE
Ketones, ur: 5 mg/dL — AB
Leukocytes,Ua: NEGATIVE
Nitrite: NEGATIVE
Protein, ur: NEGATIVE mg/dL
Specific Gravity, Urine: 1.034 — ABNORMAL HIGH (ref 1.005–1.030)
pH: 5 (ref 5.0–8.0)

## 2020-09-12 LAB — CBC
HCT: 27.9 % — ABNORMAL LOW (ref 39.0–52.0)
Hemoglobin: 8.7 g/dL — ABNORMAL LOW (ref 13.0–17.0)
MCH: 28.5 pg (ref 26.0–34.0)
MCHC: 31.2 g/dL (ref 30.0–36.0)
MCV: 91.5 fL (ref 80.0–100.0)
Platelets: 218 10*3/uL (ref 150–400)
RBC: 3.05 MIL/uL — ABNORMAL LOW (ref 4.22–5.81)
RDW: 14.9 % (ref 11.5–15.5)
WBC: 5.4 10*3/uL (ref 4.0–10.5)
nRBC: 0 % (ref 0.0–0.2)

## 2020-09-12 LAB — GLUCOSE, CAPILLARY: Glucose-Capillary: 97 mg/dL (ref 70–99)

## 2020-09-12 MED ORDER — ADULT MULTIVITAMIN W/MINERALS CH
1.0000 | ORAL_TABLET | Freq: Every day | ORAL | Status: DC
  Administered 2020-09-12 – 2020-09-14 (×3): 1 via ORAL
  Filled 2020-09-12 (×3): qty 1

## 2020-09-12 MED ORDER — MEGESTROL ACETATE 40 MG PO TABS: 400.0000 mg | ORAL_TABLET | Freq: Two times a day (BID) | ORAL | Status: DC

## 2020-09-12 MED ORDER — MEGESTROL ACETATE 400 MG/10ML PO SUSP
400.0000 mg | Freq: Two times a day (BID) | ORAL | Status: DC
  Administered 2020-09-12 – 2020-09-14 (×4): 400 mg via ORAL
  Filled 2020-09-12 (×5): qty 10

## 2020-09-12 MED ORDER — ENSURE ENLIVE PO LIQD
237.0000 mL | Freq: Three times a day (TID) | ORAL | Status: DC
  Administered 2020-09-12 – 2020-09-20 (×14): 237 mL via ORAL

## 2020-09-12 NOTE — Procedures (Signed)
Patient Name: Roy David  MRN: [id number]  Epilepsy Attending: Lora Havens  Referring Physician/Provider: Dr. Rosalin Hawking Date: 10/4/121 Duration: 25.25 minutes  Patient history: 86 year old male who initially presented with lethargy, altered mental status, speech difficulties, left hemianopsia Sia and right upper extremity weakness.  MRI showed extensive bilateral watershed zone infarcts affecting both cerebral and cerebellar hemispheres.  EEG to evaluate for seizures.  Level of alertness: Awake  AEDs during EEG study: Gabapentin  Technical aspects: This EEG study was done with scalp electrodes positioned according to the 10-20 International system of electrode placement. Electrical activity was acquired at a sampling rate of 500Hz  and reviewed with a high frequency filter of 70Hz  and a low frequency filter of 1Hz . EEG data were recorded continuously and digitally stored.   Description: No definite posterior dominant rhythm was seen.  EEG showed continuous generalized polymorphic 5 to 6 Hz theta slowing as well as intermittent 2 to 3 Hz delta slowing.  Hyperventilation and photic stimulation were not performed.     ABNORMALITY -Continuous slow, generalized  IMPRESSION: This study is suggestive of moderate diffuse encephalopathy, nonspecific etiology.  No seizures or epileptiform discharges were seen throughout the recording.  Roy David Barbra Sarks

## 2020-09-12 NOTE — Progress Notes (Signed)
EEG complete - results pending 

## 2020-09-12 NOTE — Progress Notes (Signed)
Initial Nutrition Assessment  DOCUMENTATION CODES:   Severe malnutrition in context of chronic illness  INTERVENTION:  Ensure Enlive po TID, each supplement provides 350 kcal and 20 grams of protein  Magic cup TID with meals, each supplement provides 290 kcal and 9 grams of protein  Mighty Shake po TID with meals, each supplements provides 330 kcals, 9 grams of protein  MVI daily  NUTRITION DIAGNOSIS:   Severe Malnutrition related to chronic illness (gastric carcinoma) as evidenced by severe fat depletion, severe muscle depletion, energy intake < or equal to 75% for > or equal to 1 month.    GOAL:   Patient will meet greater than or equal to 90% of their needs    MONITOR:   PO intake, Supplement acceptance, Labs, Weight trends, I & O's  REASON FOR ASSESSMENT:   Consult Assessment of nutrition requirement/status  ASSESSMENT:   Pt admitted with acute CVA. PMH includes recently diagnosed gastric carcinoma (currently undergoing radiation treatment), Afib, HTN, HLD, hypothyroidism, anemia, CKD stage III.  Per pt's wife, pt has had very poor PO intake since beginning radiation, and his intake has gotten even worse over the past 2 weeks. Pt's wife reports pt has had an associated 16lb wt loss; however, this wt loss is not reflected in the pt's wt encounters. Pt reports most recent known bodyweight was ~152 lbs.   Pt's wife reports that pt has previously had issues with feeling like supplements/foods were getting "stuck" in his throat. Discussed with SLP outside of pt's room. Will await SLP recommendations prior to ordering oral nutrition supplements. Pt agreeable to trying supplements while admitted, but is unsure which he will prefer. Will order variety of supplements in hopes of providing pt with additional kcals/protein.   No PO intake documented.   Labs reviewed.  Medications: protonix, carafate IVF: D5 in LR @ 31ml/hr  NUTRITION - FOCUSED PHYSICAL EXAM:    Most  Recent Value  Orbital Region Mild depletion  Upper Arm Region Severe depletion  Thoracic and Lumbar Region Severe depletion  Buccal Region Severe depletion  Temple Region Mild depletion  Clavicle Bone Region Moderate depletion  Clavicle and Acromion Bone Region Severe depletion  Scapular Bone Region Severe depletion  Dorsal Hand Severe depletion  Patellar Region Severe depletion  Anterior Thigh Region Severe depletion  Posterior Calf Region Moderate depletion  Edema (RD Assessment) None  Hair Reviewed  Eyes Reviewed  Mouth Reviewed  Skin Reviewed  Nails Reviewed       Diet Order:   Diet Order            Diet regular Room service appropriate? Yes; Fluid consistency: Thin  Diet effective now                 EDUCATION NEEDS:   Not appropriate for education at this time  Skin:  Skin Assessment: Reviewed RN Assessment  Last BM:  PTA  Height:   Ht Readings from Last 1 Encounters:  09/10/20 5' 8.5" (1.74 m)    Weight:  Wt Readings from Last 10 Encounters:  09/10/20 68 kg  08/25/20 68.9 kg  08/21/20 68.9 kg  03/07/20 70.3 kg  01/03/20 69.9 kg  01/16/19 74.8 kg  10/15/18 75 kg  03/04/18 73.9 kg  01/23/18 73.9 kg  01/16/18 73 kg    BMI:  Body mass index is 22.48 kg/m.  Estimated Nutritional Needs:   Kcal:  2000-2200  Protein:  100-115 grams  Fluid:  >/=2L/d    Larkin Ina, MS, RD,  LDN RD pager number and weekend/on-call pager number located in [place].

## 2020-09-12 NOTE — Plan of Care (Signed)
  Problem: Education: Goal: Knowledge of secondary prevention will improve Outcome: Progressing Goal: Knowledge of patient specific risk factors addressed and post discharge goals established will improve Outcome: Progressing   Problem: Coping: Goal: Will verbalize positive feelings about self Outcome: Progressing Goal: Will identify appropriate support needs Outcome: Progressing   Problem: Health Behavior/Discharge Planning: Goal: Ability to manage health-related needs will improve Outcome: Progressing   Problem: Self-Care: Goal: Ability to participate in self-care as condition permits will improve Outcome: Progressing Goal: Verbalization of feelings and concerns over difficulty with self-care will improve Outcome: Progressing Goal: Ability to communicate needs accurately will improve Outcome: Progressing   Problem: Nutrition: Goal: Risk of aspiration will decrease Outcome: Progressing   Problem: Intracerebral Hemorrhage Tissue Perfusion: Goal: Complications of Intracerebral Hemorrhage will be minimized Outcome: Progressing   Problem: Ischemic Stroke/TIA Tissue Perfusion: Goal: Complications of ischemic stroke/TIA will be minimized Outcome: Progressing   Problem: Spontaneous Subarachnoid Hemorrhage Tissue Perfusion: Goal: Complications of Spontaneous Subarachnoid Hemorrhage will be minimized Outcome: Progressing

## 2020-09-12 NOTE — Progress Notes (Addendum)
STROKE TEAM PROGRESS NOTE   INTERVAL HISTORY His wife is at the bedside.  Pt lying in bed, mildly lethargic, oriented to self only. Yesterday he was given ASA and vomited however today received it with no issues. Are monitoring for aspirin intolerance.   The patients wife is also expressing interest in speaking with palliative care and having them speak with her children given her husbands prognosis. Recommend the primary team to consult the palliative care team to facilitate this.   OBJECTIVE Vitals:   09/11/20 2342 09/12/20 0420 09/12/20 0732 09/12/20 1210  BP: (!) 149/85 133/72 (!) 153/75 127/74  Pulse: 72 71 73 70  Resp: 19 18 16  (!) 22  Temp: (!) 97.3 F (36.3 C) 98.3 F (36.8 C) 98.2 F (36.8 C) 98.2 F (36.8 C)  TempSrc: Oral Oral Oral Axillary  SpO2: 98% 99% 100% 100%  Weight:      Height:        CBC:  Recent Labs  Lab 09/10/20 2205 09/12/20 1144  WBC 6.1 5.4  HGB 9.1* 8.7*  HCT 29.3* 27.9*  MCV 91.6 91.5  PLT 263 194    Basic Metabolic Panel:  Recent Labs  Lab 09/10/20 2205  NA 138  K 4.1  CL 105  CO2 22  GLUCOSE 100*  BUN 20  CREATININE 1.51*  CALCIUM 9.2    Lipid Panel:     Component Value Date/Time   CHOL 134 09/11/2020 0449   TRIG 108 09/11/2020 0449   HDL 34 (L) 09/11/2020 0449   CHOLHDL 3.9 09/11/2020 0449   VLDL 22 09/11/2020 0449   LDLCALC 78 09/11/2020 0449   HgbA1c:  Lab Results  Component Value Date   HGBA1C 4.6 (L) 09/11/2020   Urine Drug Screen: No results found for: LABOPIA, COCAINSCRNUR, LABBENZ, AMPHETMU, THCU, LABBARB  Alcohol Level No results found for: [facility]  IMAGING  CT Head Wo Contrast 09/10/2020 IMPRESSION:  Acute/subacute infarct involving the right occipital lobe. Findings consistent with age related atrophy and chronic small vessel ischemia   MR BRAIN WO CONTRAST 09/11/2020 IMPRESSION:  1. 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 Largest confluent area of  ischemia is in the right occipital lobe.  2. No acute hemorrhage or mass effect.  3. Generalized atrophy and chronic small vessel ischemic changes.   CT ANGIO HEAD CODE STROKE CT ANGIO NECK CODE STROKE 09/11/2020 IMPRESSION:  1. No emergent large vessel occlusion or high-grade stenosis of the intracranial arteries.  2. Mild bilateral carotid bifurcation atherosclerosis without hemodynamically significant stenosis by NASCET criteria.  Aortic Atherosclerosis (ICD10-I70.0).   Transthoracic Echocardiogram  1. Left ventricular ejection fraction, by estimation, is 60 to 65%. The  left ventricle has normal function. The left ventricle has no regional  wall motion abnormalities. There is severe left ventricular hypertrophy.  Left ventricular diastolic parameters  are consistent with Grade I diastolic dysfunction (impaired relaxation).  2. Right ventricular systolic function is normal. The right ventricular  size is normal.  3. The mitral valve is normal in structure. Trivial mitral valve  regurgitation. No evidence of mitral stenosis. Moderate mitral annular  calcification.  4. The aortic valve is abnormal. There is severe calcifcation of the  aortic valve. There is severe thickening of the aortic valve. Aortic valve  regurgitation is moderate. Moderate to severe aortic valve stenosis.  Aortic valve mean gradient measures 32.7  mmHg. Aortic valve peak gradient measures 57.9 mmHg. Aortic valve area,  by VTI measures 0.76 cm.  5. The  inferior vena cava is normal in size with greater than 50%  respiratory variability, suggesting right atrial pressure of 3 mmHg.   ECG - SR rate 86 BPM. (See cardiology reading for complete details)   PHYSICAL EXAM  Temp:  [97.3 F (36.3 C)-98.5 F (36.9 C)] 98.2 F (36.8 C) (10/04 1210) Pulse Rate:  [68-84] 70 (10/04 1210) Resp:  [16-22] 22 (10/04 1210) BP: (121-153)/(65-85) 127/74 (10/04 1210) SpO2:  [97 %-100 %] 100 % (10/04 1210)  General -  Well nourished, well developed, in no apparent distress, mildly lethargic.  Ophthalmologic - fundi not visualized due to noncooperation.  Cardiovascular - Regular rhythm and rate.  Neuro - mildly lethargic, opens eyes to command but maintains them closed throughout majority of the examination, oriented to self only and can not answer any other LOC questions. Paucity of speech, but no dysarthria, does not participate in naming but is able to repeat a 3 word sentence. Following simple commands. Left hemianopia, not blinking to visual threat on the left. Right gaze preference, left gaze incomplete but cross midline. No facial droop, tongue midline. BUE 4/5 no drift and BLE 2/5 with pain. DTR 1+ and no babinski. FTN not cooperative. Sensation symmetrical subjectively. Gait not tested.    ASSESSMENT/PLAN [name] is a 86 y.o. male with history of  asthma, TIA, gastric cancer c/b significant GI bleed on  radiation, HTN, HLD, prior hx of CRAO of the Right eye, Pafib not on AC due to GI Bleed, loop recorder implant 2016,  who has had a decline in functional status since early September presenting with lethargy, AMS, speech difficulties,left hemianopsia, dysphagia and RUE weakness. He did not receive IV t-PA due to late presentation (>4.5 hours from time of onset) and a hx of recent Gi bleed.  Stroke: Extensive watershed infarcts bilateral anterior and posterior circulations as well as left PCA infarct, more subacute based on images, likely due to hypoperfusion with severe anemia vs. PAF not on AC  CT head - Acute/subacute infarct involving the right occipital lobe.   MRI head - Extensive bilateral watershed zone infarcts affecting both cerebral and cerebellar hemispheres. Largest confluent area of ischemia is in the right occipital lobe.    CTA H&N - No emergent large vessel occlusion or high-grade stenosis of the intracranial arteries. Mild bilateral carotid bifurcation atherosclerosis without  hemodynamically significant stenosis   2D Echo - EF 60-65%, moderate to severe AS  EEG with moderate diffuse encephalopathy, nonspecific etiology. No seizure or epileptiform discharges were seen throughout the recording   [employer] Virus 2 - negative  LDL - 78  HgbA1c - 4.6  VTE prophylaxis - SCDs  No antithrombotic prior to admission, now on aspirin 81 mg daily. OK to discontinue if pt not able to tolerate ASA.   Ongoing stroke risk factor management  Therapy recommendations: SNF   Disposition:  PT and OT are recommending SNF. However on 09/12/20 the patients wife expressed that she would like to talk to the palliative care team. It is recommended that they be consulted. Dispo pending based on discussion with palliative care   GIB with GI tumor  GIB 12/2019 but colonoscopy showed only diverticolusis - off Eliquis  Admitted for symptomatic anemia in 08/2020, found to have GI malignancy  Following with oncology, on radiation  Has home palliative care on board  On ASA 81 now- did not tolerate well on 10/3, tolerated fine the next day will maintain for now and discontinue if he can not  tolerate it   OK to d/c ASA if not tolerating for the next several days   Severe anemia  Hb 6.4 on 08/22/20 received PRBC  This admission, Hb 9.1  CBC monitoring  PAF   Followed by Dr. Sallyanne Kuster  Off Xarelto and Eliquis due to GIB  Currently NSR  On ASA 81  Hyperlipidemia  Home Lipid lowering medication: Vytorin 10/20  LDL 78, goal < 70  Current lipid lowering medication: Vytorin 10/20  Continue statin at discharge  Other Stroke Risk Factors  Advanced age  Former cigarette smoker - quit  Family hx stroke (father and sister)   Other Active Problems  Code status - Full code  CKD - stage 3b - creatinine - 1.51  Hospital day # 1  I spent  35 minutes in total face-to-face time with the patient, more than 50% of which was spent in counseling and coordination of care,  reviewing test results, images and medication, and discussing the diagnosis, treatment plan and potential prognosis. This patient's care requiresreview of multiple databases, neurological assessment, discussion with family, other specialists and medical decision making of high complexity. I had long discussion with wife at bedside, updated pt current condition, treatment plan and potential prognosis, and answered all the questions. She expressed understanding and appreciation.   Roy Hinds, NP  [facility] Nurse Practitioner  09/12/2020 2:15 PM  ATTENDING NOTE: I reviewed above note and agree with the assessment and plan. Pt was seen and examined.   Wife at the bedside.  Patient lying in bed, not in distress, so far tolerating aspirin 81.  No acute event overnight.  Patient awake alert eyes open, however, only orientated to place, not orientated to age, date of birth, people, time or situation.  Able to name common objects and repeat, follow limited simple commands.  Still has left hemianopia, but facial symmetrical.  Right upper extremity proximal 3 -/5 with drift, however distal 4/5.  Left upper extremity proximal and distal 4/5.  Bilateral lower extremity 3/5.  Sensation and coordination not corporative.  Gait not tested.  Discussed with wife, she has seen patient decline gradually over the last 1 months after discharge.  With current stroke and confusion disorientation in the setting of gastric cancer, wife felt its time to consider comfort care measures and request palliative care on board.  Relayed information to Dr. Humphrey Rolls, will consult palliative care.  Neurology will sign off. Please call with questions. Thanks for the consult.   Roy Hawking, MD PhD Stroke Neurology 09/12/2020 6:53 PM   To contact Stroke Continuity provider, please refer to [email or web address] After hours, contact General Neurology

## 2020-09-12 NOTE — Evaluation (Addendum)
Speech Language Pathology Evaluation Patient Details Name: Roy David MRN: [id number] DOB: [birth year] Today's Date: 09/12/2020 Time: 4098-1191 SLP Time Calculation (min) (ACUTE ONLY): 27 min  Problem List:  Patient Active Problem List   Diagnosis Date Noted   CVA (cerebral vascular accident) ([place]) 09/11/2020   Acute CVA (cerebrovascular accident) ([place]) 09/10/2020   Gastric cancer ([place]) 08/25/2020   Goals of care, counseling/discussion 08/25/2020   Iron deficiency anemia due to chronic blood loss 01/18/2019   Status post placement of implantable loop recorder 03/05/2018   Anemia    Hypochromic-microcytic anemia    Symptomatic anemia 01/15/2018   Paroxysmal atrial fibrillation ([place]) 03/06/2016   Central retinal artery occlusion of right eye 11/23/2015   Aortic valve stenosis, nonrheumatic 11/23/2015   Essential hypertension 11/23/2015   Hypercholesterolemia 11/23/2015   Past Medical History:  Past Medical History:  Diagnosis Date   Arthritis    Asthma    followed at [place], stable on Asmanex   Diverticulosis    Dysrhythmia    Gastric cancer ([place]) 08/25/2020   Goals of care, counseling/discussion 08/25/2020   H/O seasonal allergies    Dr [name]   Hypercholesteremia    Hypertension    Hypochromic-microcytic anemia    Microcytic anemia    Paroxysmal atrial fibrillation ([place]) 03/06/2016   on Xarelto   Prostate cancer ([place])    12/10- Dr Dahlstedt/Dr Valere Dross   Thyroid disease    hypothyroidism   Past Surgical History:  Past Surgical History:  Procedure Laterality Date   APPENDECTOMY     BIOPSY  08/21/2020   Procedure: BIOPSY;  Surgeon: Ronnette Juniper, MD;  Location: [place];  Service: Gastroenterology;;   COLONOSCOPY N/A 01/05/2020   Procedure: COLONOSCOPY;  Surgeon: Clarene Essex, MD;  Location[facility] ENDOSCOPY;  Service: Endoscopy;  Laterality: N/A;   EP IMPLANTABLE DEVICE N/A 11/29/2015   Procedure: Loop Recorder Insertion;  Surgeon: Sanda Klein, MD;  Location: [place] CV LAB;  Service: Cardiovascular;  Laterality: N/A;   ESOPHAGOGASTRODUODENOSCOPY (EGD) WITH PROPOFOL N/A 08/21/2020   Procedure: ESOPHAGOGASTRODUODENOSCOPY (EGD) WITH PROPOFOL;  Surgeon: Ronnette Juniper, MD;  Location: [place];  Service: Gastroenterology;  Laterality: N/A;   HERNIA REPAIR     RETINAL DETACHMENT SURGERY     HPI:  Pt is an 86 y/o male admitted secondary to AMS. Found to have watershed infarcts in bilateral cerebral and cerebellar hemispheres and R occipital lobe. PMH includes a fib, CKD, recent diagnosis of advanced adenocarcinoma of the stomach, radiation tx, asthma, a fib, and GI bleed.    Assessment / Plan / Recommendation Clinical Impression  Pt presents with cognitive-linguistic changes s/p bilateral CVAs. Speech is clear/fluent, and basic expressive language is normal with regard to syntax, semantics.  Follows one-step commands and inconsistent two step commands.  Processing and response times are quite delayed, there is limited spontaneity of communication.  Pt is oriented to DOB, basic biographical info except age "61" and circumstances surrounding hospitalization.  There is a left visual field deficit.  Mrs Fleece at bedside; we discussed the care that she has been giving him during the last month after his dx of cancer, and the importance of self-care and getting support from friends/family. She acknowledges being tired.  Provided support/encouragement.  Recommend ongoing SLP f/u to address basic cognition and communication.     Pt passed RN stroke swallow screen, therefore formal SLP swallow eval is not warranted per protocol.    SLP Assessment  SLP Recommendation/Assessment: Patient needs continued [facility] SLP Visit  Diagnosis: Cognitive communication deficit (R41.841)    Follow Up Recommendations  Skilled Nursing facility    Frequency and Duration min 2x/week  1 week      SLP Evaluation Cognition   Overall Cognitive Status: Impaired/Different from baseline Arousal/Alertness: Awake/alert Orientation Level: Oriented to person;Oriented to place;Disoriented to situation;Disoriented to time Attention: Sustained;Selective Sustained Attention: Impaired Sustained Attention Impairment: Verbal basic Selective Attention: Impaired Selective Attention Impairment: Verbal basic Memory: Impaired Memory Impairment: Storage deficit Awareness: Impaired Safety/Judgment: Impaired       Comprehension  Auditory Comprehension Overall Auditory Comprehension: Appears within functional limits for tasks assessed Yes/No Questions: Within Functional Limits Commands: Impaired One Step Basic Commands: 75-100% accurate Two Step Basic Commands: 50-74% accurate Reading Comprehension Reading Status: Not tested    Expression Expression Primary Mode of Expression: Verbal Verbal Expression Overall Verbal Expression: Appears within functional limits for tasks assessed Written Expression Dominant Hand: Right Written Expression: Not tested   Oral / Motor  Oral Motor/Sensory Function Overall Oral Motor/Sensory Function: Within functional limits Motor Speech Overall Motor Speech: Appears within functional limits for tasks assessed   GO                    Juan Quam Laurice 09/12/2020, 1:05 PM  Tate Jerkins L. Tivis Ringer, [place] Office number [phone] Pager [phone]

## 2020-09-12 NOTE — Progress Notes (Signed)
PROGRESS NOTE    Roy David  XEN:[id number] DOB: 1930/02/03 DOA: 09/10/2020 PCP: [name], L.Marlou Sa, MD (Confirm with patient/family/NH records and if not entered, this HAS to be entered at [facility] point of entry. "No PCP" if truly none.)   Brief Narrative: (Start on day 1 of progress note - keep it brief and live) Patient is a 86 year old Caucasian male with history of hypertension, hyperlipidemia, paroxysmal atrial fibrillation, recently diagnosed gastric cancer undergoing radiation treatment, hypothyroidism, anemia and ED stage III presented to ED for evaluation of altered mental status.  In ED patient was found to have multiple acute versus subacute strokes.   Assessment & Plan:   Acute CVA Patient is admitted for further stroke work-up according to neurology recommendations after was positive for acute/subacute infarct involving the right occipital lobe.  MRI brain revealed bilateral extensive watershed infarcts in the cerebral and cerebellar hemisphere with generalized atrophy and chronic small vessel ischemic changes.  CT angiogram head and neck was negative for large vessel occlusion or stenosis.  Echocardiogram showed grade 2 severe aortic stenosis.  Patient was started on aspirin 81 mg with close monitoring because of recent GI bleed.  Continue statin.  Lipid panel showed low HDL while total cholesterol, triglycerides and VLDL within normal limits.  Hemoglobin A1c 4.6.  PT/OT is evaluating and treating the patient and recommended skilled nursing facility.  Patient's was present at the bedside and expressed interest in speaking with palliative care..  Palliative care consult placed.  Gastric cancer undergoing radiation treatment Patient is undergoing radiation therapy and completed 7 out of 15 treatments.  Patient recently had GI bleeding s/p blood transfusions.  Continue Protonix and Carafate.  Continue to monitor for GI bleed because patient is recently started on aspirin secondary to  stroke.  Paroxysmal atrial fibrillation not on anticoagulation Stable.  No anticoagulation because of recent GI bleed.  Anemia with recent GI bleeding Hemoglobin is stable at this time.  Continue to monitor  Anorexia: Most likely secondary to underlying cancer. Megace 400mg  BID ordered  Hypertension Stable  Hyperlipidemia Continue Zetia and statin  Hypothyroidism Synthroid  Asthma Albuterol as needed  CKD stage III Continue to monitor creatinine level.  Aortic stenosis: Echocardiogram showed grade 2 severe aortic stenosis.  Patient case discussed with cardiology who recommended outpatient follow-up and does not think that patient is a candidate for TAVR secondary to ongoing GI bleed and prognosis.     DVT prophylaxis: No chemical prophylaxis because of GI bleed.  SCD Code Status: Full code Family Communication: Patient's wife present at the bedside Disposition Plan:   Consultants:   Neurology  Procedures: (Don't include imaging studies which can be auto populated. Include things that cannot be auto populated i.e. Echo, Carotid and venous dopplers, Foley, Bipap, HD, tubes/drains, wound vac, central lines etc)  Echocardiogram     Subjective: Patient seen and evaluated at the bedside this morning.  Patient's wife is also present at the bedside.  No acute events reported by patient or the nursing staff.  Patient is awake alert and oriented and denies any complaints at this time.  No focal neurological deficits noted.  Objective: Vitals:   09/11/20 2342 09/12/20 0420 09/12/20 0732 09/12/20 1210  BP: (!) 149/85 133/72 (!) 153/75 127/74  Pulse: 72 71 73 70  Resp: 19 18 16  (!) 22  Temp: (!) 97.3 F (36.3 C) 98.3 F (36.8 C) 98.2 F (36.8 C) 98.2 F (36.8 C)  TempSrc: Oral Oral Oral Axillary  SpO2: 98% 99% 100% 100%  Weight:      Height:        Intake/Output Summary (Last 24 hours) at 09/12/2020 1531 Last data filed at 09/12/2020 0800 Gross per 24 hour   Intake 140 ml  Output --  Net 140 ml   Filed Weights   09/10/20 1935  Weight: 68 kg    Examination:  General exam: Appears calm and comfortable  Respiratory system: Clear to auscultation. Respiratory effort normal. Cardiovascular system: S1 & S2 heard, RRR. No JVD, murmurs, rubs, gallops or clicks. No pedal edema. Gastrointestinal system: Abdomen is nondistended, soft and nontender. No organomegaly or masses felt. Normal bowel sounds heard. Central nervous system: Alert and oriented. No focal neurological deficits.  Cranial nerves II through XII grossly intact.  Normal speech Extremities: Symmetric  Skin: No rashes, lesions or ulcers Psychiatry: Judgement and insight appear normal. Mood & affect appropriate.     Data Reviewed: I have personally reviewed following labs and imaging studies  CBC: Recent Labs  Lab 09/10/20 2205 09/12/20 1144  WBC 6.1 5.4  HGB 9.1* 8.7*  HCT 29.3* 27.9*  MCV 91.6 91.5  PLT 263 673   Basic Metabolic Panel: Recent Labs  Lab 09/10/20 2205  NA 138  K 4.1  CL 105  CO2 22  GLUCOSE 100*  BUN 20  CREATININE 1.51*  CALCIUM 9.2   GFR: Estimated Creatinine Clearance: 32.5 mL/min (A) (by C-G formula based on SCr of 1.51 mg/dL (H)). Liver Function Tests: Recent Labs  Lab 09/10/20 2205  AST 39  ALT 13  ALKPHOS 57  BILITOT 1.1  PROT 5.8*  ALBUMIN 2.8*   No results for input(s): LIPASE, AMYLASE in the last 168 hours. No results for input(s): AMMONIA in the last 168 hours. Coagulation Profile: Recent Labs  Lab 09/10/20 2205  INR 1.1   Cardiac Enzymes: No results for input(s): CKTOTAL, CKMB, CKMBINDEX, TROPONINI in the last 168 hours. BNP (last 3 results) No results for input(s): PROBNP in the last 8760 hours. HbA1C: Recent Labs    09/11/20 0448  HGBA1C 4.6*   CBG: Recent Labs  Lab 09/10/20 2212  GLUCAP 97   Lipid Profile: Recent Labs    09/11/20 0449  CHOL 134  HDL 34*  LDLCALC 78  TRIG 108  CHOLHDL 3.9    Thyroid Function Tests: No results for input(s): TSH, T4TOTAL, FREET4, T3FREE, THYROIDAB in the last 72 hours. Anemia Panel: No results for input(s): VITAMINB12, FOLATE, FERRITIN, TIBC, IRON, RETICCTPCT in the last 72 hours. Sepsis Labs: No results for input(s): PROCALCITON, LATICACIDVEN in the last 168 hours.  Recent Results (from the past 240 hour(s))  Respiratory Panel by RT PCR (Flu A&B, Covid) - Nasopharyngeal Swab     Status: None   Collection Time: 09/10/20 11:51 PM   Specimen: Nasopharyngeal Swab  Result Value Ref Range Status   SARS Coronavirus 2 by RT PCR NEGATIVE NEGATIVE Final    Comment: (NOTE) SARS-CoV-2 target nucleic acids are NOT DETECTED.  The SARS-CoV-2 RNA is generally detectable in upper respiratoy specimens during the acute phase of infection. The lowest concentration of SARS-CoV-2 viral copies this assay can detect is 131 copies/mL. A negative result does not preclude SARS-Cov-2 infection and should not be used as the sole basis for treatment or other patient management decisions. A negative result may occur with  improper specimen collection/handling, submission of specimen other than nasopharyngeal swab, presence of viral mutation(s) within the areas targeted by this assay, and inadequate number of viral copies (<131 copies/mL). A negative  result must be combined with clinical observations, patient history, and epidemiological information. The expected result is Negative.  Fact Sheet for Patients:  PinkCheek.be  Fact Sheet for Healthcare Providers:  GravelBags.it  This test is no t yet approved or cleared by the [place] FDA and  has been authorized for detection and/or diagnosis of SARS-CoV-2 by FDA under an Emergency Use Authorization (EUA). This EUA will remain  in effect (meaning this test can be used) for the duration of the COVID-19 declaration under Section 564(b)(1) of the Act, 21  U.S.C. section 360bbb-3(b)(1), unless the authorization is terminated or revoked sooner.     Influenza A by PCR NEGATIVE NEGATIVE Final   Influenza B by PCR NEGATIVE NEGATIVE Final    Comment: (NOTE) The Xpert Xpress SARS-CoV-2/FLU/RSV assay is intended as an aid in  the diagnosis of influenza from Nasopharyngeal swab specimens and  should not be used as a sole basis for treatment. Nasal washings and  aspirates are unacceptable for Xpert Xpress SARS-CoV-2/FLU/RSV  testing.  Fact Sheet for Patients: PinkCheek.be  Fact Sheet for Healthcare Providers: GravelBags.it  This test is not yet approved or cleared by the [place] FDA and  has been authorized for detection and/or diagnosis of SARS-CoV-2 by  FDA under an Emergency Use Authorization (EUA). This EUA will remain  in effect (meaning this test can be used) for the duration of the  Covid-19 declaration under Section 564(b)(1) of the Act, 21  U.S.C. section 360bbb-3(b)(1), unless the authorization is  terminated or revoked. Performed at [facility], [place] [address] [place], [place] [address]          Radiology Studies: CT Head Wo Contrast  Result Date: 09/10/2020 CLINICAL DATA:  Mental status change EXAM: CT HEAD WITHOUT CONTRAST TECHNIQUE: Contiguous axial images were obtained from the base of the skull through the vertex without intravenous contrast. COMPARISON:  August 21, 2020 FINDINGS: Brain: There is an area of hypodensity seen along the posterior right occipital lobe with loss of gray-white differentiation. No mass effect or midline shift. Prior lacunar infarct seen within the right basal ganglia. There is dilatation the ventricles and sulci consistent with age-related atrophy. Low-attenuation changes in the deep white matter consistent with small vessel ischemia. Vascular: No hyperdense vessel or unexpected calcification. Skull: The skull is intact. No  fracture or focal lesion identified. Sinuses/Orbits: The visualized paranasal sinuses and mastoid air cells are clear. The orbits and globes intact. Other: None IMPRESSION: Acute/subacute infarct involving the right occipital lobe. Findings consistent with age related atrophy and chronic small vessel ischemia Electronically Signed   By: Prudencio Pair M.D.   On: 09/10/2020 23:27   MR BRAIN WO CONTRAST  Result Date: 09/11/2020 CLINICAL DATA:  Stroke follow-up EXAM: MRI HEAD WITHOUT CONTRAST TECHNIQUE: Multiplanar, multiecho pulse sequences of the brain and surrounding structures were obtained without intravenous contrast. COMPARISON:  CTA head neck 09/11/2020 FINDINGS: Brain: There are bilateral watershed zone infarcts affecting both cerebral and cerebellar hemispheres. Largest confluent area of ischemia is in the right occipital lobe. Multifocal hyperintense T2-weighted signal within the white matter. There is generalized atrophy without lobar predilection. Multiple foci of chronic hemorrhage in the lateral right cerebellum. Normal midline structures. Vascular: Normal flow voids. Skull and upper cervical spine: Normal marrow signal. Sinuses/Orbits: Negative.  Bilateral scleral bands. Other: None IMPRESSION: 1. 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 Largest confluent area of ischemia is in the right occipital lobe. 2. No acute hemorrhage or  mass effect. 3. Generalized atrophy and chronic small vessel ischemic changes. Electronically Signed   By: Ulyses Jarred M.D.   On: 09/11/2020 02:37   EEG adult  Result Date: 09/12/2020 Lora Havens, MD     09/12/2020 12:16 PM Patient Name: Kohler Pellerito MRN: [id number] Epilepsy Attending: Lora Havens Referring Physician/Provider: Dr. Rosalin Hawking Date: 10/4/121 Duration: 25.25 minutes Patient history: 86 year old male who initially presented with lethargy, altered mental status, speech difficulties, left  hemianopsia Sia and right upper extremity weakness.  MRI showed extensive bilateral watershed zone infarcts affecting both cerebral and cerebellar hemispheres.  EEG to evaluate for seizures. Level of alertness: Awake AEDs during EEG study: Gabapentin Technical aspects: This EEG study was done with scalp electrodes positioned according to the 10-20 International system of electrode placement. Electrical activity was acquired at a sampling rate of 500Hz  and reviewed with a high frequency filter of 70Hz  and a low frequency filter of 1Hz . EEG data were recorded continuously and digitally stored. Description: No definite posterior dominant rhythm was seen.  EEG showed continuous generalized polymorphic 5 to 6 Hz theta slowing as well as intermittent 2 to 3 Hz delta slowing.  Hyperventilation and photic stimulation were not performed.   ABNORMALITY -Continuous slow, generalized IMPRESSION: This study is suggestive of moderate diffuse encephalopathy, nonspecific etiology.  No seizures or epileptiform discharges were seen throughout the recording. Lora Havens   ECHOCARDIOGRAM COMPLETE  Result Date: 09/11/2020    ECHOCARDIOGRAM REPORT   Patient Name:   ALMANDO BRAWLEY Date of Exam: 09/11/2020 Medical Rec #:  [id number]    Height:       68.5 in Accession #:    [id number]   Weight:       150.0 lb Date of Birth:  July 16, 1930     BSA:          1.818 m Patient Age:    34 years     BP:           140/71 mmHg Patient Gender: M            HR:           80 bpm. Exam Location:  Inpatient Procedure: 2D Echo, Cardiac Doppler and Color Doppler Indications:    Stroke  History:        Patient has prior history of Echocardiogram examinations, most                 recent 02/29/2020. Aortic Valve Disease; Risk                 Factors:Dyslipidemia. CKD.  Sonographer:    Clayton Lefort RDCS (AE) Referring Phys: [id number] Cleaster Corin PATEL  Sonographer Comments: Technically challenging study due to patient movement. IMPRESSIONS  1. Left ventricular  ejection fraction, by estimation, is 60 to 65%. The left ventricle has normal function. The left ventricle has no regional wall motion abnormalities. There is severe left ventricular hypertrophy. Left ventricular diastolic parameters  are consistent with Grade I diastolic dysfunction (impaired relaxation).  2. Right ventricular systolic function is normal. The right ventricular size is normal.  3. The mitral valve is normal in structure. Trivial mitral valve regurgitation. No evidence of mitral stenosis. Moderate mitral annular calcification.  4. The aortic valve is abnormal. There is severe calcifcation of the aortic valve. There is severe thickening of the aortic valve. Aortic valve regurgitation is moderate. Moderate to severe aortic valve stenosis. Aortic valve mean gradient measures 32.7  mmHg. Aortic valve peak  gradient measures 57.9 mmHg. Aortic valve area, by VTI measures 0.76 cm.  5. The inferior vena cava is normal in size with greater than 50% respiratory variability, suggesting right atrial pressure of 3 mmHg. FINDINGS  Left Ventricle: Left ventricular ejection fraction, by estimation, is 60 to 65%. The left ventricle has normal function. The left ventricle has no regional wall motion abnormalities. The left ventricular internal cavity size was normal in size. There is  severe left ventricular hypertrophy. Left ventricular diastolic parameters are consistent with Grade I diastolic dysfunction (impaired relaxation). Normal left ventricular filling pressure. Right Ventricle: The right ventricular size is normal. No increase in right ventricular wall thickness. Right ventricular systolic function is normal. Left Atrium: Left atrial size was normal in size. Right Atrium: Right atrial size was normal in size. Pericardium: There is no evidence of pericardial effusion. Mitral Valve: The mitral valve is normal in structure. There is moderate thickening of the mitral valve leaflet(s). There is moderate  calcification of the mitral valve leaflet(s). Moderate mitral annular calcification. Trivial mitral valve regurgitation.  No evidence of mitral valve stenosis. Tricuspid Valve: The tricuspid valve is normal in structure. Tricuspid valve regurgitation is not demonstrated. No evidence of tricuspid stenosis. Aortic Valve: The aortic valve is abnormal. There is severe calcifcation of the aortic valve. There is severe thickening of the aortic valve. There is severe aortic valve annular calcification. Aortic valve regurgitation is moderate. Aortic regurgitation  PHT measures 225 msec. Moderate to severe aortic stenosis is present. Aortic valve mean gradient measures 32.7 mmHg. Aortic valve peak gradient measures 57.9 mmHg. Aortic valve area, by VTI measures 0.76 cm. Pulmonic Valve: The pulmonic valve was not well visualized. Pulmonic valve regurgitation is not visualized. No evidence of pulmonic stenosis. Aorta: The aortic root is normal in size and structure. Pulmonary Artery: Indeterminant PASP, inadequate TR jet. Venous: The inferior vena cava is normal in size with greater than 50% respiratory variability, suggesting right atrial pressure of 3 mmHg. IAS/Shunts: No atrial level shunt detected by color flow Doppler.  LEFT VENTRICLE PLAX 2D LVIDd:         4.50 cm  Diastology LVIDs:         3.00 cm  LV e' medial:    4.79 cm/s LV PW:         1.50 cm  LV E/e' medial:  10.6 LV IVS:        1.50 cm  LV e' lateral:   6.74 cm/s LVOT diam:     2.00 cm  LV E/e' lateral: 7.6 LV SV:         63 LV SV Index:   34 LVOT Area:     3.14 cm  RIGHT VENTRICLE            IVC RV Basal diam:  3.40 cm    IVC diam: 0.80 cm RV S prime:     9.14 cm/s TAPSE (M-mode): 2.1 cm LEFT ATRIUM           Index       RIGHT ATRIUM           Index LA diam:      2.40 cm 1.32 cm/m  RA Area:     16.40 cm LA Vol (A2C): 38.8 ml 21.34 ml/m RA Volume:   41.50 ml  22.82 ml/m LA Vol (A4C): 40.0 ml 22.00 ml/m  AORTIC VALVE AV Area (Vmax):    0.74 cm AV Area  (Vmean):   0.71 cm AV Area (VTI):  0.76 cm AV Vmax:           380.52 cm/s AV Vmean:          269.609 cm/s AV VTI:            0.820 m AV Peak Grad:      57.9 mmHg AV Mean Grad:      32.7 mmHg LVOT Vmax:         89.10 cm/s LVOT Vmean:        60.750 cm/s LVOT VTI:          0.199 m LVOT/AV VTI ratio: 0.24 AI PHT:            225 msec  AORTA Ao Root diam: 3.70 cm MITRAL VALVE MV Area (PHT): 3.65 cm    SHUNTS MV Decel Time: 208 msec    Systemic VTI:  0.20 m MV E velocity: 50.90 cm/s  Systemic Diam: 2.00 cm MV A velocity: 89.20 cm/s MV E/A ratio:  0.57 Carlyle Dolly MD Electronically signed by Carlyle Dolly MD Signature Date/Time: 09/11/2020/9:32:47 AM    Final    CT ANGIO HEAD CODE STROKE  Result Date: 09/11/2020 CLINICAL DATA:  Aphasia EXAM: CT ANGIOGRAPHY HEAD AND NECK TECHNIQUE: Multidetector CT imaging of the head and neck was performed using the standard protocol during bolus administration of intravenous contrast. Multiplanar CT image reconstructions and MIPs were obtained to evaluate the vascular anatomy. Carotid stenosis measurements (when applicable) are obtained utilizing NASCET criteria, using the distal internal carotid diameter as the denominator. CONTRAST:  36mL OMNIPAQUE IOHEXOL 350 MG/ML SOLN COMPARISON:  None. FINDINGS: CTA NECK FINDINGS SKELETON: There is no bony spinal canal stenosis. No lytic or blastic lesion. OTHER NECK: Normal pharynx, larynx and major salivary glands. No cervical lymphadenopathy. Unremarkable thyroid gland. UPPER CHEST: No pneumothorax or pleural effusion. No nodules or masses. AORTIC ARCH: There is mild calcific atherosclerosis of the aortic arch. There is no aneurysm, dissection or hemodynamically significant stenosis of the visualized portion of the aorta. Conventional 3 vessel aortic branching pattern. The visualized proximal subclavian arteries are widely patent. RIGHT CAROTID SYSTEM: No dissection, occlusion or aneurysm. Mild atherosclerotic calcification at the  carotid bifurcation without hemodynamically significant stenosis. LEFT CAROTID SYSTEM: No dissection, occlusion or aneurysm. Mild atherosclerotic calcification at the carotid bifurcation without hemodynamically significant stenosis. VERTEBRAL ARTERIES: Codominant configuration. Both origins are clearly patent. There is no dissection, occlusion or flow-limiting stenosis to the skull base (V1-V3 segments). CTA HEAD FINDINGS POSTERIOR CIRCULATION: --Vertebral arteries: Normal V4 segments. --Inferior cerebellar arteries: Normal. --Basilar artery: Normal. --Superior cerebellar arteries: Normal. --Posterior cerebral arteries (PCA): Normal. ANTERIOR CIRCULATION: --Intracranial internal carotid arteries: Atherosclerotic calcification of the internal carotid arteries at the skull base without hemodynamically significant stenosis. --Anterior cerebral arteries (ACA): Normal. Both A1 segments are present. Patent anterior communicating artery (a-comm). --Middle cerebral arteries (MCA): Normal. VENOUS SINUSES: As permitted by contrast timing, patent. ANATOMIC VARIANTS: None Review of the MIP images confirms the above findings. IMPRESSION: 1. No emergent large vessel occlusion or high-grade stenosis of the intracranial arteries. 2. Mild bilateral carotid bifurcation atherosclerosis without hemodynamically significant stenosis by NASCET criteria. Aortic Atherosclerosis (ICD10-I70.0). Electronically Signed   By: Ulyses Jarred M.D.   On: 09/11/2020 00:30   CT ANGIO NECK CODE STROKE  Result Date: 09/11/2020 CLINICAL DATA:  Aphasia EXAM: CT ANGIOGRAPHY HEAD AND NECK TECHNIQUE: Multidetector CT imaging of the head and neck was performed using the standard protocol during bolus administration of intravenous contrast. Multiplanar CT image reconstructions and MIPs were obtained  to evaluate the vascular anatomy. Carotid stenosis measurements (when applicable) are obtained utilizing NASCET criteria, using the distal internal carotid  diameter as the denominator. CONTRAST:  42mL OMNIPAQUE IOHEXOL 350 MG/ML SOLN COMPARISON:  None. FINDINGS: CTA NECK FINDINGS SKELETON: There is no bony spinal canal stenosis. No lytic or blastic lesion. OTHER NECK: Normal pharynx, larynx and major salivary glands. No cervical lymphadenopathy. Unremarkable thyroid gland. UPPER CHEST: No pneumothorax or pleural effusion. No nodules or masses. AORTIC ARCH: There is mild calcific atherosclerosis of the aortic arch. There is no aneurysm, dissection or hemodynamically significant stenosis of the visualized portion of the aorta. Conventional 3 vessel aortic branching pattern. The visualized proximal subclavian arteries are widely patent. RIGHT CAROTID SYSTEM: No dissection, occlusion or aneurysm. Mild atherosclerotic calcification at the carotid bifurcation without hemodynamically significant stenosis. LEFT CAROTID SYSTEM: No dissection, occlusion or aneurysm. Mild atherosclerotic calcification at the carotid bifurcation without hemodynamically significant stenosis. VERTEBRAL ARTERIES: Codominant configuration. Both origins are clearly patent. There is no dissection, occlusion or flow-limiting stenosis to the skull base (V1-V3 segments). CTA HEAD FINDINGS POSTERIOR CIRCULATION: --Vertebral arteries: Normal V4 segments. --Inferior cerebellar arteries: Normal. --Basilar artery: Normal. --Superior cerebellar arteries: Normal. --Posterior cerebral arteries (PCA): Normal. ANTERIOR CIRCULATION: --Intracranial internal carotid arteries: Atherosclerotic calcification of the internal carotid arteries at the skull base without hemodynamically significant stenosis. --Anterior cerebral arteries (ACA): Normal. Both A1 segments are present. Patent anterior communicating artery (a-comm). --Middle cerebral arteries (MCA): Normal. VENOUS SINUSES: As permitted by contrast timing, patent. ANATOMIC VARIANTS: None Review of the MIP images confirms the above findings. IMPRESSION: 1. No emergent  large vessel occlusion or high-grade stenosis of the intracranial arteries. 2. Mild bilateral carotid bifurcation atherosclerosis without hemodynamically significant stenosis by NASCET criteria. Aortic Atherosclerosis (ICD10-I70.0). Electronically Signed   By: Ulyses Jarred M.D.   On: 09/11/2020 00:30        Scheduled Meds: . aspirin EC  81 mg Oral Daily  . ezetimibe  10 mg Oral Daily  . gabapentin  300 mg Oral Daily  . levothyroxine  100 mcg Oral Q0600  . pantoprazole  40 mg Oral BID  . simvastatin  20 mg Oral q1800  . sucralfate  1 g Oral TID  . tetrahydrozoline  1 drop Both Eyes BID   Continuous Infusions: . dextrose 5% lactated ringers 75 mL/hr at 09/12/20 0408          Edmonia Lynch, MD [facility] Pager [phone]  If 7PM-7AM, please contact night-coverage [email or web address] Password [email or web address] 09/12/2020, 3:31 PM

## 2020-09-12 NOTE — TOC Initial Note (Signed)
Transition of Care [facility]) - Initial/Assessment Note    Patient Details  Name: Roy David MRN: [id number] Date of Birth: 15-Oct-1930  Transition of Care [facility]) CM/SW Contact:    Geralynn Ochs, LCSW Phone Number: 09/12/2020, 2:40 PM  Clinical Narrative:     CSW met with patient's wife, [name], at bedside to discuss recommendation for SNF. Margie said she is ready to be discharged, she thinks that the patient will do better at home. Margie indicated that she just wants to take the patient home and keep him comfortable, as she is concerned about his disease trajectory. When CSW asked if that meant hospice, the wife said she wasn't interested in hospice yet but that MD had mentioned palliative care. Patient is already being followed by [facility] for palliative, established through Dr. Antonieta Pert office. Margie agreeable for that to continue. CSW asked about home health, and [name] said she didn't think the patient would benefit from therapy at home but asked about [place]. CSW explained that [facility] would be therapy, and [name] said that wasn't what she wanted, but she just needed some extra help at home. CSW directed Margie to researching [employer] or [employer] to start looking at caregiver support at home. [name] indicated that there is family to help out in the meantime. CSW asked about equipment at home, and wife said she wasn't sure if she needed anything else but that she knows where to get equipment if she needs it once they get home. Noting per neurology note, recommendation for palliative care consult; will follow for any additional recommendations after palliative discussion.   Expected Discharge Plan: Home/Self Care Barriers to Discharge: Continued Medical Work up   Patient Goals and CMS Choice Patient states their goals for this hospitalization and ongoing recovery are:: patient unable to participate in goal setting CMS Medicare.gov Compare Post Acute Care list  provided to:: Patient Represenative (must comment) Choice offered to / list presented to : Spouse  Expected Discharge Plan and Services Expected Discharge Plan: Home/Self Care       Living arrangements for the past 2 months: Single Family Home                                      Prior Living Arrangements/Services Living arrangements for the past 2 months: Single Family Home Lives with:: Spouse Patient language and need for interpreter reviewed:: No Do you feel safe going back to the place where you live?: Yes      Need for Family Participation in Patient Care: Yes (Comment) Care giver support system in place?: Yes (comment) Current home services: DME Criminal Activity/Legal Involvement Pertinent to Current Situation/Hospitalization: No - Comment as needed  Activities of Daily Living      Permission Sought/Granted Permission sought to share information with : Family Supports Permission granted to share information with : Yes, Verbal Permission Granted  Share Information with NAME: [name]     Permission granted to share info w Relationship: Spouse     Emotional Assessment   Attitude/Demeanor/Rapport: Unable to Assess Affect (typically observed): Unable to Assess Orientation: : Oriented to Self Alcohol / Substance Use: Not Applicable Psych Involvement: No (comment)  Admission diagnosis:  CVA (cerebral vascular accident) ([place]) [I63.9] Acute CVA (cerebrovascular accident) ([place]) [I63.9] Acute stroke due to ischemia [facility]) [I63.9] Patient Active Problem List   Diagnosis Date Noted  . Protein-calorie malnutrition, severe 09/12/2020  .  CVA (cerebral vascular accident) ([place]) 09/11/2020  . Acute CVA (cerebrovascular accident) ([place]) 09/10/2020  . Gastric cancer ([place]) 08/25/2020  . Goals of care, counseling/discussion 08/25/2020  . Iron deficiency anemia due to chronic blood loss 01/18/2019  . Status post placement of implantable loop recorder 03/05/2018  . Anemia    . Hypochromic-microcytic anemia   . Symptomatic anemia 01/15/2018  . Paroxysmal atrial fibrillation ([place]) 03/06/2016  . Central retinal artery occlusion of right eye 11/23/2015  . Aortic valve stenosis, nonrheumatic 11/23/2015  . Essential hypertension 11/23/2015  . Hypercholesterolemia 11/23/2015   PCP:  [name], L.Marlou Sa, MD Pharmacy:   [facility] [place], [place] - [address] AT [address] [place] [address] Phone: [phone] Fax: [phone]  [facility] [phone] - [place], [place] [facility] - [address] AT [place] [address] Phone: [phone] Fax: [phone]     Social Determinants of Health (SDOH) Interventions    Readmission Risk Interventions No flowsheet data found.

## 2020-09-13 ENCOUNTER — Telehealth: Payer: Self-pay | Admitting: Radiation Oncology

## 2020-09-13 ENCOUNTER — Encounter (HOSPITAL_COMMUNITY): Payer: Self-pay | Admitting: Family Medicine

## 2020-09-13 ENCOUNTER — Telehealth: Payer: Self-pay | Admitting: Hematology & Oncology

## 2020-09-13 ENCOUNTER — Ambulatory Visit: Payer: Medicare Other

## 2020-09-13 DIAGNOSIS — Z515 Encounter for palliative care: Secondary | ICD-10-CM

## 2020-09-13 DIAGNOSIS — C168 Malignant neoplasm of overlapping sites of stomach: Secondary | ICD-10-CM | POA: Diagnosis not present

## 2020-09-13 DIAGNOSIS — I480 Paroxysmal atrial fibrillation: Secondary | ICD-10-CM | POA: Diagnosis not present

## 2020-09-13 DIAGNOSIS — Z7189 Other specified counseling: Secondary | ICD-10-CM

## 2020-09-13 LAB — CBC
HCT: 28.4 % — ABNORMAL LOW (ref 39.0–52.0)
Hemoglobin: 8.9 g/dL — ABNORMAL LOW (ref 13.0–17.0)
MCH: 29.1 pg (ref 26.0–34.0)
MCHC: 31.3 g/dL (ref 30.0–36.0)
MCV: 92.8 fL (ref 80.0–100.0)
Platelets: 238 10*3/uL (ref 150–400)
RBC: 3.06 MIL/uL — ABNORMAL LOW (ref 4.22–5.81)
RDW: 15.1 % (ref 11.5–15.5)
WBC: 7.5 10*3/uL (ref 4.0–10.5)
nRBC: 0 % (ref 0.0–0.2)

## 2020-09-13 LAB — BASIC METABOLIC PANEL
Anion gap: 10 (ref 5–15)
BUN: 15 mg/dL (ref 8–23)
CO2: 25 mmol/L (ref 22–32)
Calcium: 8.9 mg/dL (ref 8.9–10.3)
Chloride: 108 mmol/L (ref 98–111)
Creatinine, Ser: 1.41 mg/dL — ABNORMAL HIGH (ref 0.61–1.24)
GFR calc Af Amer: 51 mL/min — ABNORMAL LOW (ref >60)
GFR calc non Af Amer: 44 mL/min — ABNORMAL LOW (ref >60)
Glucose, Bld: 129 mg/dL — ABNORMAL HIGH (ref 70–99)
Potassium: 3.1 mmol/L — ABNORMAL LOW (ref 3.5–5.1)
Sodium: 143 mmol/L (ref 135–145)

## 2020-09-13 LAB — MAGNESIUM: Magnesium: 1.7 mg/dL (ref 1.7–2.4)

## 2020-09-13 MED ORDER — POTASSIUM CHLORIDE CRYS ER 20 MEQ PO TBCR
40.0000 meq | EXTENDED_RELEASE_TABLET | Freq: Once | ORAL | Status: AC
  Administered 2020-09-13: 40 meq via ORAL
  Filled 2020-09-13: qty 2

## 2020-09-13 MED ORDER — SODIUM CHLORIDE 0.9 % IV BOLUS
500.0000 mL | Freq: Once | INTRAVENOUS | Status: AC
  Administered 2020-09-13: 500 mL via INTRAVENOUS

## 2020-09-13 NOTE — Plan of Care (Signed)
  Problem: Education: Goal: Knowledge of secondary prevention will improve Outcome: Progressing Goal: Knowledge of patient specific risk factors addressed and post discharge goals established will improve Outcome: Progressing   Problem: Coping: Goal: Will verbalize positive feelings about self Outcome: Progressing Goal: Will identify appropriate support needs Outcome: Progressing   Problem: Health Behavior/Discharge Planning: Goal: Ability to manage health-related needs will improve Outcome: Progressing   Problem: Self-Care: Goal: Ability to participate in self-care as condition permits will improve Outcome: Progressing Goal: Verbalization of feelings and concerns over difficulty with self-care will improve Outcome: Progressing Goal: Ability to communicate needs accurately will improve Outcome: Progressing   Problem: Nutrition: Goal: Risk of aspiration will decrease Outcome: Progressing   Problem: Intracerebral Hemorrhage Tissue Perfusion: Goal: Complications of Intracerebral Hemorrhage will be minimized Outcome: Progressing   Problem: Ischemic Stroke/TIA Tissue Perfusion: Goal: Complications of ischemic stroke/TIA will be minimized Outcome: Progressing   Problem: Spontaneous Subarachnoid Hemorrhage Tissue Perfusion: Goal: Complications of Spontaneous Subarachnoid Hemorrhage will be minimized Outcome: Progressing

## 2020-09-13 NOTE — Progress Notes (Signed)
Pt was too fatigued and wife agreed that he is feeling weaker today.  His plan is to go to SNF, and will recommend this as he is not making much progress yet with mobility.  He will need 2 person support for mobility ongoing, and will continue to need encouragement to maintain his mobility.   09/12/20 1918  PT Visit Information  Last PT Received On 09/12/20  Assistance Needed +2  History of Present Illness Pt is an 86 y/o male admitted secondary to AMS. Found to have watershed infarcts in bilateral cerebral and cerebellar hemispheres and R occiptal lobe. PMH includes a fib, CKD, gastric cancer, asthma, a fib, and GI bleed.   Subjective Data  Subjective reports he does not feel like trying to get OOB, wife concurs  Patient Stated Goal none stated  Precautions  Precautions Fall  Precaution Comments visual deficits  Restrictions  Weight Bearing Restrictions No  Pain Assessment  Pain Assessment No/denies pain  Cognition  Arousal/Alertness Lethargic  Behavior During Therapy Flat affect  Overall Cognitive Status Impaired/Different from baseline  Area of Impairment Awareness;Following commands;Attention  Orientation Level Situation;Time;Place  Current Attention Level Selective  Memory Decreased short-term memory  Following Commands Follows one step commands with increased time;Follows one step commands inconsistently  Safety/Judgement Decreased awareness of deficits  Awareness Intellectual  Problem Solving Slow processing;Requires verbal cues;Requires tactile cues  Bed Mobility  General bed mobility comments declined OOB  Transfers  General transfer comment declined  Exercises  Exercises General Lower Extremity  General Exercises - Lower Extremity  Ankle Circles/Pumps AAROM;5 reps  Quad Sets AROM;10 reps  Heel Slides AAROM;10 reps  Hip ABduction/ADduction AAROM;10 reps  PT - End of Session  Activity Tolerance Patient limited by fatigue  Patient left in bed;with call bell/phone  within reach;with family/visitor present;with bed alarm set  Nurse Communication Mobility status   PT - Assessment/Plan  PT Plan Current plan remains appropriate  PT Visit Diagnosis Muscle weakness (generalized) (M62.81);Unsteadiness on feet (R26.81);Difficulty in walking, not elsewhere classified (R26.2);History of falling (Z91.81)  PT Frequency (ACUTE ONLY) Min 3X/week  Follow Up Recommendations SNF;Supervision/Assistance - 24 hour  PT equipment None recommended by PT  AM-PAC PT "6 Clicks" Mobility Outcome Measure (Version 2)  Help needed turning from your back to your side while in a flat bed without using bedrails? 2  Help needed moving from lying on your back to sitting on the side of a flat bed without using bedrails? 2  Help needed moving to and from a bed to a chair (including a wheelchair)? 2  Help needed standing up from a chair using your arms (e.g., wheelchair or bedside chair)? 2  Help needed to walk in hospital room? 1  Help needed climbing 3-5 steps with a railing?  1  6 Click Score 10  Consider Recommendation of Discharge To: CIR/SNF/LTACH  PT Goal Progression  Progress towards PT goals Not progressing toward goals - comment (did not get OOB today)  PT Time Calculation  PT Start Time (ACUTE ONLY) 1150  PT Stop Time (ACUTE ONLY) 1204  PT Time Calculation (min) (ACUTE ONLY) 14 min  PT General Charges  $$ ACUTE PT VISIT 1 Visit    Mee Hives, PT MS Acute Rehab Dept. Number: [place]

## 2020-09-13 NOTE — Telephone Encounter (Signed)
Pt daughter called for mom, [name], stating that patient, Roy David, was admitted to [facility] on 10/2. She states that we need to cancel all of the remaining radiation tx as they don't see him being able to complete due to dx. I am letting Dr. Sondra Come and nurse and L4 know.

## 2020-09-13 NOTE — Progress Notes (Signed)
Potassium 3.1, [place] informed.

## 2020-09-13 NOTE — Consult Note (Addendum)
[place]  [phone] Fax:([phone] [place]  Reason for Consultation: Locally advanced gastric adenocarcinoma, HER-2 positive  HPI: Mr. Gillentine is an 86 year old male with a past medical history significant for locally advanced gastric adenocarcinoma currently receiving radiation, paroxysmal atrial fibrillation not on anticoagulation secondary to GI bleed, hypertension, hyperlipidemia, hypothyroidism, asthma, anemia, stage III CKD.  He presented to the emergency room with altered mental status.  The patient was hospitalized recently from 08/20/2020 through 08/23/2020 for anemia secondary to GI bleed.  He was given 3 units of PRBCs while in the hospital and has remained fatigued with a decline in performance status since leaving the hospital.  He has also had difficulty with his vision since leaving the hospital.  On the day of admission, the patient appeared to be altered and withdrawn compared to his usual self.  He contacted EMS and they noted that his speech was garbled and he was unable to say what he wanted to say.  He was not oriented to self when EMS arrived.  In the emergency room, his WBC was 6.1, hemoglobin 9.1, platelet count 263,000, BUN 20, creatinine 1.51.  CT of the head without contrast showed acute/subacute infarct involving the right occipital lobe.  Neurology was consulted who recommended CTA head/neck and cerebral perfusion study.  CTA head and neck showed no emergent large vessel occlusion or high-grade stenosis of the intracranial arteries, mild bilateral carotid bifurcation atherosclerosis without hemodynamically significant stenosis by NASCET criteria.  MRI of the brain without contrast showed extensive bilateral watershed zone infarcts affecting both cerebral and cerebellar hemispheres, largest confluent area of ischemia is in the right occipital lobe, no acute hemorrhage or mass-effect.  Neurology continues to follow and has  started on 81 mg of aspirin daily but have indicated it is okay to discontinue if patient is unable to tolerate.  The patient and his wife asked about speaking with the palliative care team and consult was performed earlier today.  Definitive plan for disposition at this time is unclear.  Wife has expressed some concern about taking care of him in the home even with the support of hospice.  Palliative care plans to follow back up with the spouse when she has more time to talk.  With regard to his gastric adenocarcinoma, he was placed on radiation to try to control his GI bleeding.  Last visit from our office indicates that Xeloda was considered as a radiosensitizer, but the patient was not a good candidate for this medication given his performance status.  Since his tumor is HER-2 positive, the patient was going to be considered for Herceptin once radiation completed and if performance status improved.  The patient's son is at the bedside today.  The patient had a fever up to 100.8 just prior to my visit.  The patient's son reports that he has been unable to swallow pills and they had to give him a Tylenol suppository.  He is not really able to eat.  He took a sip of a drink at lunch.  He is noted to be coughing intermittently during my visit.  The patient offers no complaints to me today.  He states that he is not hurting.  Son is not aware of any active bleeding but states that he had some dark stool about a week ago at home.  Son states that he knows who he is but is not always aware of where he is or what year it is.  Medical oncology was asked to see the patient to make recommendations regarding his gastric adenocarcinoma.    Past Medical History:  Diagnosis Date  . Arthritis   . Asthma    followed at [facility], stable on Asmanex  . Diverticulosis   . Dysrhythmia   . Gastric cancer ([place]) 08/25/2020  . Goals of care, counseling/discussion 08/25/2020  . H/O seasonal allergies    Dr [name]  .  Hypercholesteremia   . Hypertension   . Hypochromic-microcytic anemia   . Microcytic anemia   . Paroxysmal atrial fibrillation ([place]) 03/06/2016   on Xarelto  . Prostate cancer [facility])    12/10- Dr Dahlstedt/Dr Valere Dross  . Thyroid disease    hypothyroidism  :  Past Surgical History:  Procedure Laterality Date  . APPENDECTOMY    . BIOPSY  08/21/2020   Procedure: BIOPSY;  Surgeon: Ronnette Juniper, MD;  Location: [facility];  Service: Gastroenterology;;  . COLONOSCOPY N/A 01/05/2020   Procedure: COLONOSCOPY;  Surgeon: Clarene Essex, MD;  Location: [place];  Service: Endoscopy;  Laterality: N/A;  . EP IMPLANTABLE DEVICE N/A 11/29/2015   Procedure: Loop Recorder Insertion;  Surgeon: Sanda Klein, MD;  Location: [place] [facility];  Service: Cardiovascular;  Laterality: N/A;  . ESOPHAGOGASTRODUODENOSCOPY (EGD) WITH PROPOFOL N/A 08/21/2020   Procedure: ESOPHAGOGASTRODUODENOSCOPY (EGD) WITH PROPOFOL;  Surgeon: Ronnette Juniper, MD;  Location: [place];  Service: Gastroenterology;  Laterality: N/A;  . HERNIA REPAIR    . RETINAL DETACHMENT SURGERY    :  Current Facility-Administered Medications  Medication Dose Route Frequency Provider Last Rate Last Admin  . acetaminophen (TYLENOL) tablet 650 mg  650 mg Oral Q4H PRN Lenore Cordia, MD       Or  . acetaminophen (TYLENOL) 160 MG/5ML solution 650 mg  650 mg Per Tube Q4H PRN Lenore Cordia, MD       Or  . acetaminophen (TYLENOL) suppository 650 mg  650 mg Rectal Q4H PRN Zada Finders R, MD      . albuterol (VENTOLIN HFA) 108 (90 Base) MCG/ACT inhaler 1-2 puff  1-2 puff Inhalation Q6H PRN Zada Finders R, MD      . aspirin EC tablet 81 mg  81 mg Oral Daily Zada Finders R, MD   81 mg at 09/13/20 1035  . dextrose 5 % in lactated ringers infusion   Intravenous Continuous Donnamae Jude, MD 75 mL/hr at 09/13/20 0433 New Bag at 09/13/20 0433  . ezetimibe (ZETIA) tablet 10 mg  10 mg Oral Daily Donnamae Jude, MD   10 mg at 09/13/20 1035  . feeding  supplement (ENSURE ENLIVE) (ENSURE ENLIVE) liquid 237 mL  237 mL Oral TID BM Donnamae Jude, MD   237 mL at 09/12/20 1754  . gabapentin (NEURONTIN) capsule 300 mg  300 mg Oral Daily Zada Finders R, MD   300 mg at 09/13/20 1035  . levothyroxine (SYNTHROID) tablet 100 mcg  100 mcg Oral Q0600 Lenore Cordia, MD   100 mcg at 09/13/20 1610  . megestrol (MEGACE) 400 MG/10ML suspension 400 mg  400 mg Oral BID Bunnie Pion Z, DO   400 mg at 09/13/20 1035  . multivitamin with minerals tablet 1 tablet  1 tablet Oral Daily Donnamae Jude, MD   1 tablet at 09/13/20 1034  . pantoprazole (PROTONIX) EC tablet 40 mg  40 mg Oral BID Lenore Cordia, MD   40 mg at 09/13/20 1035  . senna-docusate (Senokot-S) tablet 1 tablet  1 tablet Oral  QHS PRN Zada Finders R, MD      . simvastatin (ZOCOR) tablet 20 mg  20 mg Oral q1800 Donnamae Jude, MD   20 mg at 09/12/20 1756  . sucralfate (CARAFATE) 1 GM/10ML suspension 1 g  1 g Oral TID Lenore Cordia, MD   1 g at 09/13/20 1035  . tetrahydrozoline 0.05 % ophthalmic solution 1 drop  1 drop Both Eyes BID Zada Finders R, MD   1 drop at 09/12/20 1017     Allergies  Allergen Reactions  . Bee Venom Anaphylaxis    Wasp and yellow jackets  :  Family History  Problem Relation Age of Onset  . Heart attack Mother   . Stroke Father   . Stroke Sister   :  Social History   Socioeconomic History  . Marital status: Married    Spouse name: Not on file  . Number of children: Not on file  . Years of education: Not on file  . Highest education level: Not on file  Occupational History  . Occupation: retired  Tobacco Use  . Smoking status: Former Smoker    Quit date: 12/09/1959    Years since quitting: 60.8  . Smokeless tobacco: Never Used  Vaping Use  . Vaping Use: Never used  Substance and Sexual Activity  . Alcohol use: Yes    Alcohol/week: 14.0 standard drinks    Types: 7 Shots of liquor, 7 Cans of beer per week  . Drug use: No  . Sexual activity: Not on  file  Other Topics Concern  . Not on file  Social History Narrative   Tobacco use cigarettes : Former smoker, Quit in year 1955   Occupation : retired/  married   Social Determinants of Radio broadcast assistant Strain:   . Difficulty of Paying Living Expenses: Not on file  Food Insecurity:   . Worried About Charity fundraiser in the Last Year: Not on file  . Ran Out of Food in the Last Year: Not on file  Transportation Needs:   . Lack of Transportation (Medical): Not on file  . Lack of Transportation (Non-Medical): Not on file  Physical Activity:   . Days of Exercise per Week: Not on file  . Minutes of Exercise per Session: Not on file  Stress:   . Feeling of Stress : Not on file  Social Connections:   . Frequency of Communication with Friends and Family: Not on file  . Frequency of Social Gatherings with Friends and Family: Not on file  . Attends Religious Services: Not on file  . Active Member of Clubs or Organizations: Not on file  . Attends Archivist Meetings: Not on file  . Marital Status: Not on file  Intimate Partner Violence:   . Fear of Current or Ex-Partner: Not on file  . Emotionally Abused: Not on file  . Physically Abused: Not on file  . Sexually Abused: Not on file  :  Review of Systems: As noted in the HPI, unable to obtain a comprehensive review of systems secondary to mild patient confusion.  Exam: Patient Vitals for the past 24 hrs:  BP Temp Temp src Pulse Resp SpO2  09/13/20 0808 (!) 142/82 99.7 F (37.6 C) Oral 92 20 98 %  09/13/20 0349 129/75 98.6 F (37 C) -- 81 16 97 %  09/12/20 2349 116/68 98.5 F (36.9 C) -- 73 15 99 %  09/12/20 1919 -- 98 F (36.7 C) -- -- -- --  09/12/20 1918 132/74 -- -- 69 17 98 %  09/12/20 1532 138/75 98.3 F (36.8 C) Oral 70 (!) 22 97 %  09/12/20 1210 127/74 98.2 F (36.8 C) Axillary 70 (!) 22 100 %    General: Chronically ill-appearing male, no distress Eyes:  no scleral icterus.   ENT:  There  were no oropharyngeal lesions.    Respiratory: lungs were clear bilaterally without wheezing or crackles.   Cardiovascular: Regular rate and rhythm, 3/6 holosystolic murmur, no pedal edema. GI:  abdomen was soft, flat, nontender, nondistended, without organomegaly.   Musculoskeletal: Does not fully follow commands in order to test strength. Skin exam was without echymosis, petichae.   Neuro: The patient is alert he is oriented to self and knows that he is at [facility].  He is not oriented to time.  Lab Results  Component Value Date   WBC 7.5 09/13/2020   HGB 8.9 (L) 09/13/2020   HCT 28.4 (L) 09/13/2020   PLT 238 09/13/2020   GLUCOSE 129 (H) 09/13/2020   CHOL 134 09/11/2020   TRIG 108 09/11/2020   HDL 34 (L) 09/11/2020   LDLCALC 78 09/11/2020   ALT 13 09/10/2020   AST 39 09/10/2020   NA 143 09/13/2020   K 3.1 (L) 09/13/2020   CL 108 09/13/2020   CREATININE 1.41 (H) 09/13/2020   BUN 15 09/13/2020   CO2 25 09/13/2020    CT Head Wo Contrast  Result Date: 09/10/2020 CLINICAL DATA:  Mental status change EXAM: CT HEAD WITHOUT CONTRAST TECHNIQUE: Contiguous axial images were obtained from the base of the skull through the vertex without intravenous contrast. COMPARISON:  August 21, 2020 FINDINGS: Brain: There is an area of hypodensity seen along the posterior right occipital lobe with loss of gray-white differentiation. No mass effect or midline shift. Prior lacunar infarct seen within the right basal ganglia. There is dilatation the ventricles and sulci consistent with age-related atrophy. Low-attenuation changes in the deep white matter consistent with small vessel ischemia. Vascular: No hyperdense vessel or unexpected calcification. Skull: The skull is intact. No fracture or focal lesion identified. Sinuses/Orbits: The visualized paranasal sinuses and mastoid air cells are clear. The orbits and globes intact. Other: None IMPRESSION: Acute/subacute infarct involving the right occipital lobe.  Findings consistent with age related atrophy and chronic small vessel ischemia Electronically Signed   By: Prudencio Pair M.D.   On: 09/10/2020 23:27   CT Head Wo Contrast  Result Date: 08/21/2020 CLINICAL DATA:  Dizziness for 1 week with syncopal episode this morning. EXAM: CT HEAD WITHOUT CONTRAST TECHNIQUE: Contiguous axial images were obtained from the base of the skull through the vertex without intravenous contrast. COMPARISON:  None. FINDINGS: Brain: No evidence of acute infarction, hemorrhage, hydrocephalus, extra-axial collection or mass lesion/mass effect. Diffuse cerebral atrophy. Patchy low-attenuation changes in the white matter consistent with small vessel ischemic changes. Vascular: Intracranial arterial vascular calcifications are present. Skull: The calvarium appears intact. Small left frontal subcutaneous scalp hematoma. Sinuses/Orbits: Mucosal thickening in the paranasal sinuses. No acute air-fluid levels. Mastoid air cells are clear. Previous bilateral ocular banding procedures. Other: Congenital nonunion of the posterior arch of C1. IMPRESSION: 1. No acute intracranial abnormalities. 2. Chronic atrophy and small vessel ischemic changes in the white matter. 3. Small left frontal subcutaneous scalp hematoma. Electronically Signed   By: Lucienne Capers M.D.   On: 08/21/2020 03:54   MR BRAIN WO CONTRAST  Result Date: 09/11/2020 CLINICAL DATA:  Stroke follow-up EXAM: MRI HEAD WITHOUT CONTRAST TECHNIQUE: Multiplanar,  multiecho pulse sequences of the brain and surrounding structures were obtained without intravenous contrast. COMPARISON:  CTA head neck 09/11/2020 FINDINGS: Brain: There are bilateral watershed zone infarcts affecting both cerebral and cerebellar hemispheres. Largest confluent area of ischemia is in the right occipital lobe. Multifocal hyperintense T2-weighted signal within the white matter. There is generalized atrophy without lobar predilection. Multiple foci of chronic  hemorrhage in the lateral right cerebellum. Normal midline structures. Vascular: Normal flow voids. Skull and upper cervical spine: Normal marrow signal. Sinuses/Orbits: Negative.  Bilateral scleral bands. Other: None IMPRESSION: 1. 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 Largest confluent area of ischemia is in the right occipital lobe. 2. No acute hemorrhage or mass effect. 3. Generalized atrophy and chronic small vessel ischemic changes. Electronically Signed   By: Ulyses Jarred M.D.   On: 09/11/2020 02:37   CT CHEST ABDOMEN PELVIS W CONTRAST  Result Date: 08/21/2020 CLINICAL DATA:  Gastric mass in GI bleeding EXAM: CT CHEST, ABDOMEN, AND PELVIS WITH CONTRAST TECHNIQUE: Multidetector CT imaging of the chest, abdomen and pelvis was performed following the standard protocol during bolus administration of intravenous contrast. CONTRAST:  188m OMNIPAQUE IOHEXOL 300 MG/ML  SOLN COMPARISON:  12/31/2019 FINDINGS: CT CHEST FINDINGS Cardiovascular: Calcified atheromatous plaque in the thoracic aorta. 4.2 cm ascending aortic dilation, no comparison of this area. Central pulmonary vasculature, unremarkable on venous phase assessment. Calcifications of the aortic valve and mitral annular calcification. Calcified coronary artery disease.  No pericardial fluid. Mediastinum/Nodes: No adenopathy in the chest. Esophagus mildly patulous. Thickening of the gastroesophageal junction. Lungs/Pleura: Background pulmonary emphysema. Mild subpleural reticulation. Scattered tree-in-bud opacities, for instance on image 47 of series 4 in the posterior RIGHT upper lobe. Biapical pleural and parenchymal scarring. Calcified nodule in the RIGHT mid chest is compatible with granuloma. Airways are patent. No consolidation. No pleural effusion. Musculoskeletal: No acute musculoskeletal process. Spinal degenerative changes in the thoracic spine. CT ABDOMEN PELVIS FINDINGS Hepatobiliary:  Low-density hepatic lesion in the RIGHT hepatic lobe is stable compared to the prior study, well-circumscribed and showing water density compatible with small cyst. Liver contours are smooth. No suspicious hepatic lesion. Portal vein is patent. Hepatic veins are patent. No pericholecystic stranding.  No biliary duct distension. Pancreas: No peripancreatic stranding. Cystic area in the pancreatic body 2.1 x 1.7 cm, previously 2.1 x 1.6 cm for Spleen: Areas of low attenuation in the peripheral spleen with wedge-shaped characteristics suggesting small splenic infarcts. More geographic area of low attenuation seen along the lateral margin of the spleen measuring approximately 12 mm. Adrenals/Urinary Tract: Adrenal glands are normal. Symmetric renal enhancement. Cortical scarring of bilateral kidneys. Mild fullness of the proximal RIGHT ureter is stable, no hydronephrosis. Stomach/Bowel: Stomach is under distended limiting assessment. No discrete mass is noted. There is diffuse gastric thickening with mural stratification. Significance uncertain though certainly could be seen in the setting of diffuse gastric neoplasm or with gastritis. Mild perigastric stranding may reflect changes of recent intervention Signs of colonic diverticulosis.  No sign of diverticulitis. Vascular/Lymphatic: Calcified and noncalcified atheromatous plaque of the abdominal aorta, extending into the iliac vessels. Small lymph nodes throughout the hepatic gastric ligament without pathologic enlargement in the 4-5 mm range. However, in the coronal plane there is diffuse nodular and hazy soft tissue stranding throughout the hepatic gastric ligament. Early local tumor infiltration is considered. No retroperitoneal adenopathy. No pelvic lymphadenopathy. Reproductive: Prostate with brachytherapy seeds in place. Other: No ascites. Musculoskeletal: No acute musculoskeletal finding. No destructive bone process.  IMPRESSION: 1. Diffuse gastric thickening  with mural stratification in this patient with known gastric neoplasm. Superimposed gastritis could accentuate this appearance. 2. Coronal images display soft tissue infiltrating the hepatic gastric recess best seen on image 30 of series 94 where there is a focus extending from the wall of the stomach approximately 12 x 8 mm. Findings are concerning for early tumor infiltration beyond the wall the stomach. Hazy nodularity can be seen throughout the hepatic gastric ligament best seen on image 24 of series 6. Also some nodularity along short gastrics along the greater curvature of the stomach, also suspicious for extra gastric involvement. These nodules track along the gastrosplenic ligament there is general hazy nodularity in this area. 3. Stable cystic area in the pancreas, attention on follow-up. Dedicated MRI/MRCP could be considered as clinically indicated for further assessment in 6 months. 4. Areas of low attenuation in the peripheral spleen with wedge-shaped characteristics suggesting small splenic infarcts. More geographic area of low attenuation along the lateral margin of the spleen measuring approximately 12 mm. These may represent a small infarct. Consider close follow-up to exclude the possibility of small splenic lesion/metastasis though this would be unusual. MRI could be potentially helpful in differentiating these considerations. 5. Scattered tree-in-bud opacities in the posterior RIGHT upper lobe may reflect an infectious or inflammatory process. Recommend attention on follow-up. 6. 4.2 cm ascending aortic dilation, compatible with mild aneurysmal dilation. Recommend annual imaging followup by CTA or MRA. This recommendation follows 2010 ACCF/AHA/AATS/ACR/ASA/SCA/SCAI/SIR/STS/SVM Guidelines for the Diagnosis and Management of Patients with Thoracic Aortic Disease. Circulation. 2010; 121: W098-J191. Aortic aneurysm NOS (ICD10-I71.9) 7. Prostate with brachytherapy seeds in place. 8. Emphysema and  aortic atherosclerosis. Aortic Atherosclerosis (ICD10-I70.0) and Emphysema (ICD10-J43.9). Electronically Signed   By: Zetta Bills M.D.   On: 08/21/2020 17:52   EEG adult  Result Date: 09/12/2020 Lora Havens, MD     09/12/2020 12:16 PM Patient Name: Tavien Chestnut MRN: [id number] Epilepsy Attending: Lora Havens Referring Physician/Provider: Dr. Rosalin Hawking Date: 10/4/121 Duration: 25.25 minutes Patient history: 86 year old male who initially presented with lethargy, altered mental status, speech difficulties, left hemianopsia Sia and right upper extremity weakness.  MRI showed extensive bilateral watershed zone infarcts affecting both cerebral and cerebellar hemispheres.  EEG to evaluate for seizures. Level of alertness: Awake AEDs during EEG study: Gabapentin Technical aspects: This EEG study was done with scalp electrodes positioned according to the 10-20 International system of electrode placement. Electrical activity was acquired at a sampling rate of _0  and reviewed with a high frequency filter of _1  and a low frequency filter of _2 . EEG data were recorded continuously and digitally stored. Description: No definite posterior dominant rhythm was seen.  EEG showed continuous generalized polymorphic 5 to 6 Hz theta slowing as well as intermittent 2 to 3 Hz delta slowing.  Hyperventilation and photic stimulation were not performed.   ABNORMALITY -Continuous slow, generalized IMPRESSION: This study is suggestive of moderate diffuse encephalopathy, nonspecific etiology.  No seizures or epileptiform discharges were seen throughout the recording. Lora Havens   ECHOCARDIOGRAM COMPLETE  Result Date: 09/11/2020    ECHOCARDIOGRAM REPORT   Patient Name:   HORST OSTERMILLER Date of Exam: 09/11/2020 Medical Rec #:  [id number]    Height:       68.5 in Accession #:    [id number]   Weight:       150.0 lb Date of Birth:  [birth year]     BSA:  1.818 m Patient Age:    89 years     BP:           140/71 mmHg  Patient Gender: M            HR:           80 bpm. Exam Location:  Inpatient Procedure: 2D Echo, Cardiac Doppler and Color Doppler Indications:    Stroke  History:        Patient has prior history of Echocardiogram examinations, most                 recent 02/29/2020. Aortic Valve Disease; Risk                 Factors:Dyslipidemia. CKD.  Sonographer:    Clayton Lefort RDCS (AE) Referring Phys: [id number] Cleaster Corin PATEL  Sonographer Comments: Technically challenging study due to patient movement. IMPRESSIONS  1. Left ventricular ejection fraction, by estimation, is 60 to 65%. The left ventricle has normal function. The left ventricle has no regional wall motion abnormalities. There is severe left ventricular hypertrophy. Left ventricular diastolic parameters  are consistent with Grade I diastolic dysfunction (impaired relaxation).  2. Right ventricular systolic function is normal. The right ventricular size is normal.  3. The mitral valve is normal in structure. Trivial mitral valve regurgitation. No evidence of mitral stenosis. Moderate mitral annular calcification.  4. The aortic valve is abnormal. There is severe calcifcation of the aortic valve. There is severe thickening of the aortic valve. Aortic valve regurgitation is moderate. Moderate to severe aortic valve stenosis. Aortic valve mean gradient measures 32.7  mmHg. Aortic valve peak gradient measures 57.9 mmHg. Aortic valve area, by VTI measures 0.76 cm.  5. The inferior vena cava is normal in size with greater than 50% respiratory variability, suggesting right atrial pressure of 3 mmHg. FINDINGS  Left Ventricle: Left ventricular ejection fraction, by estimation, is 60 to 65%. The left ventricle has normal function. The left ventricle has no regional wall motion abnormalities. The left ventricular internal cavity size was normal in size. There is  severe left ventricular hypertrophy. Left ventricular diastolic parameters are consistent with Grade I diastolic  dysfunction (impaired relaxation). Normal left ventricular filling pressure. Right Ventricle: The right ventricular size is normal. No increase in right ventricular wall thickness. Right ventricular systolic function is normal. Left Atrium: Left atrial size was normal in size. Right Atrium: Right atrial size was normal in size. Pericardium: There is no evidence of pericardial effusion. Mitral Valve: The mitral valve is normal in structure. There is moderate thickening of the mitral valve leaflet(s). There is moderate calcification of the mitral valve leaflet(s). Moderate mitral annular calcification. Trivial mitral valve regurgitation.  No evidence of mitral valve stenosis. Tricuspid Valve: The tricuspid valve is normal in structure. Tricuspid valve regurgitation is not demonstrated. No evidence of tricuspid stenosis. Aortic Valve: The aortic valve is abnormal. There is severe calcifcation of the aortic valve. There is severe thickening of the aortic valve. There is severe aortic valve annular calcification. Aortic valve regurgitation is moderate. Aortic regurgitation  PHT measures 225 msec. Moderate to severe aortic stenosis is present. Aortic valve mean gradient measures 32.7 mmHg. Aortic valve peak gradient measures 57.9 mmHg. Aortic valve area, by VTI measures 0.76 cm. Pulmonic Valve: The pulmonic valve was not well visualized. Pulmonic valve regurgitation is not visualized. No evidence of pulmonic stenosis. Aorta: The aortic root is normal in size and structure. Pulmonary Artery: Indeterminant PASP, inadequate TR jet. Venous:  The inferior vena cava is normal in size with greater than 50% respiratory variability, suggesting right atrial pressure of 3 mmHg. IAS/Shunts: No atrial level shunt detected by color flow Doppler.  LEFT VENTRICLE PLAX 2D LVIDd:         4.50 cm  Diastology LVIDs:         3.00 cm  LV e' medial:    4.79 cm/s LV PW:         1.50 cm  LV E/e' medial:  10.6 LV IVS:        1.50 cm  LV e'  lateral:   6.74 cm/s LVOT diam:     2.00 cm  LV E/e' lateral: 7.6 LV SV:         63 LV SV Index:   34 LVOT Area:     3.14 cm  RIGHT VENTRICLE            IVC RV Basal diam:  3.40 cm    IVC diam: 0.80 cm RV S prime:     9.14 cm/s TAPSE (M-mode): 2.1 cm LEFT ATRIUM           Index       RIGHT ATRIUM           Index LA diam:      2.40 cm 1.32 cm/m  RA Area:     16.40 cm LA Vol (A2C): 38.8 ml 21.34 ml/m RA Volume:   41.50 ml  22.82 ml/m LA Vol (A4C): 40.0 ml 22.00 ml/m  AORTIC VALVE AV Area (Vmax):    0.74 cm AV Area (Vmean):   0.71 cm AV Area (VTI):     0.76 cm AV Vmax:           380.52 cm/s AV Vmean:          269.609 cm/s AV VTI:            0.820 m AV Peak Grad:      57.9 mmHg AV Mean Grad:      32.7 mmHg LVOT Vmax:         89.10 cm/s LVOT Vmean:        60.750 cm/s LVOT VTI:          0.199 m LVOT/AV VTI ratio: 0.24 AI PHT:            225 msec  AORTA Ao Root diam: 3.70 cm MITRAL VALVE MV Area (PHT): 3.65 cm    SHUNTS MV Decel Time: 208 msec    Systemic VTI:  0.20 m MV E velocity: 50.90 cm/s  Systemic Diam: 2.00 cm MV A velocity: 89.20 cm/s MV E/A ratio:  0.57 Carlyle Dolly MD Electronically signed by Carlyle Dolly MD Signature Date/Time: 09/11/2020/9:32:47 AM    Final    CT ANGIO HEAD CODE STROKE  Result Date: 09/11/2020 CLINICAL DATA:  Aphasia EXAM: CT ANGIOGRAPHY HEAD AND NECK TECHNIQUE: Multidetector CT imaging of the head and neck was performed using the standard protocol during bolus administration of intravenous contrast. Multiplanar CT image reconstructions and MIPs were obtained to evaluate the vascular anatomy. Carotid stenosis measurements (when applicable) are obtained utilizing NASCET criteria, using the distal internal carotid diameter as the denominator. CONTRAST:  37m OMNIPAQUE IOHEXOL 350 MG/ML SOLN COMPARISON:  None. FINDINGS: CTA NECK FINDINGS SKELETON: There is no bony spinal canal stenosis. No lytic or blastic lesion. OTHER NECK: Normal pharynx, larynx and major salivary glands. No  cervical lymphadenopathy. Unremarkable thyroid gland. UPPER CHEST: No pneumothorax or pleural effusion. No  nodules or masses. AORTIC ARCH: There is mild calcific atherosclerosis of the aortic arch. There is no aneurysm, dissection or hemodynamically significant stenosis of the visualized portion of the aorta. Conventional 3 vessel aortic branching pattern. The visualized proximal subclavian arteries are widely patent. RIGHT CAROTID SYSTEM: No dissection, occlusion or aneurysm. Mild atherosclerotic calcification at the carotid bifurcation without hemodynamically significant stenosis. LEFT CAROTID SYSTEM: No dissection, occlusion or aneurysm. Mild atherosclerotic calcification at the carotid bifurcation without hemodynamically significant stenosis. VERTEBRAL ARTERIES: Codominant configuration. Both origins are clearly patent. There is no dissection, occlusion or flow-limiting stenosis to the skull base (V1-V3 segments). CTA HEAD FINDINGS POSTERIOR CIRCULATION: --Vertebral arteries: Normal V4 segments. --Inferior cerebellar arteries: Normal. --Basilar artery: Normal. --Superior cerebellar arteries: Normal. --Posterior cerebral arteries (PCA): Normal. ANTERIOR CIRCULATION: --Intracranial internal carotid arteries: Atherosclerotic calcification of the internal carotid arteries at the skull base without hemodynamically significant stenosis. --Anterior cerebral arteries (ACA): Normal. Both A1 segments are present. Patent anterior communicating artery (a-comm). --Middle cerebral arteries (MCA): Normal. VENOUS SINUSES: As permitted by contrast timing, patent. ANATOMIC VARIANTS: None Review of the MIP images confirms the above findings. IMPRESSION: 1. No emergent large vessel occlusion or high-grade stenosis of the intracranial arteries. 2. Mild bilateral carotid bifurcation atherosclerosis without hemodynamically significant stenosis by NASCET criteria. Aortic Atherosclerosis (ICD10-I70.0). Electronically Signed   By: Ulyses Jarred M.D.   On: 09/11/2020 00:30   CT ANGIO NECK CODE STROKE  Result Date: 09/11/2020 CLINICAL DATA:  Aphasia EXAM: CT ANGIOGRAPHY HEAD AND NECK TECHNIQUE: Multidetector CT imaging of the head and neck was performed using the standard protocol during bolus administration of intravenous contrast. Multiplanar CT image reconstructions and MIPs were obtained to evaluate the vascular anatomy. Carotid stenosis measurements (when applicable) are obtained utilizing NASCET criteria, using the distal internal carotid diameter as the denominator. CONTRAST:  68m OMNIPAQUE IOHEXOL 350 MG/ML SOLN COMPARISON:  None. FINDINGS: CTA NECK FINDINGS SKELETON: There is no bony spinal canal stenosis. No lytic or blastic lesion. OTHER NECK: Normal pharynx, larynx and major salivary glands. No cervical lymphadenopathy. Unremarkable thyroid gland. UPPER CHEST: No pneumothorax or pleural effusion. No nodules or masses. AORTIC ARCH: There is mild calcific atherosclerosis of the aortic arch. There is no aneurysm, dissection or hemodynamically significant stenosis of the visualized portion of the aorta. Conventional 3 vessel aortic branching pattern. The visualized proximal subclavian arteries are widely patent. RIGHT CAROTID SYSTEM: No dissection, occlusion or aneurysm. Mild atherosclerotic calcification at the carotid bifurcation without hemodynamically significant stenosis. LEFT CAROTID SYSTEM: No dissection, occlusion or aneurysm. Mild atherosclerotic calcification at the carotid bifurcation without hemodynamically significant stenosis. VERTEBRAL ARTERIES: Codominant configuration. Both origins are clearly patent. There is no dissection, occlusion or flow-limiting stenosis to the skull base (V1-V3 segments). CTA HEAD FINDINGS POSTERIOR CIRCULATION: --Vertebral arteries: Normal V4 segments. --Inferior cerebellar arteries: Normal. --Basilar artery: Normal. --Superior cerebellar arteries: Normal. --Posterior cerebral arteries (PCA):  Normal. ANTERIOR CIRCULATION: --Intracranial internal carotid arteries: Atherosclerotic calcification of the internal carotid arteries at the skull base without hemodynamically significant stenosis. --Anterior cerebral arteries (ACA): Normal. Both A1 segments are present. Patent anterior communicating artery (a-comm). --Middle cerebral arteries (MCA): Normal. VENOUS SINUSES: As permitted by contrast timing, patent. ANATOMIC VARIANTS: None Review of the MIP images confirms the above findings. IMPRESSION: 1. No emergent large vessel occlusion or high-grade stenosis of the intracranial arteries. 2. Mild bilateral carotid bifurcation atherosclerosis without hemodynamically significant stenosis by NASCET criteria. Aortic Atherosclerosis (ICD10-I70.0). Electronically Signed   By: KUlyses JarredM.D.   On: 09/11/2020 00:30  CT Head Wo Contrast  Result Date: 09/10/2020 CLINICAL DATA:  Mental status change EXAM: CT HEAD WITHOUT CONTRAST TECHNIQUE: Contiguous axial images were obtained from the base of the skull through the vertex without intravenous contrast. COMPARISON:  August 21, 2020 FINDINGS: Brain: There is an area of hypodensity seen along the posterior right occipital lobe with loss of gray-white differentiation. No mass effect or midline shift. Prior lacunar infarct seen within the right basal ganglia. There is dilatation the ventricles and sulci consistent with age-related atrophy. Low-attenuation changes in the deep white matter consistent with small vessel ischemia. Vascular: No hyperdense vessel or unexpected calcification. Skull: The skull is intact. No fracture or focal lesion identified. Sinuses/Orbits: The visualized paranasal sinuses and mastoid air cells are clear. The orbits and globes intact. Other: None IMPRESSION: Acute/subacute infarct involving the right occipital lobe. Findings consistent with age related atrophy and chronic small vessel ischemia Electronically Signed   By: Prudencio Pair  M.D.   On: 09/10/2020 23:27   CT Head Wo Contrast  Result Date: 08/21/2020 CLINICAL DATA:  Dizziness for 1 week with syncopal episode this morning. EXAM: CT HEAD WITHOUT CONTRAST TECHNIQUE: Contiguous axial images were obtained from the base of the skull through the vertex without intravenous contrast. COMPARISON:  None. FINDINGS: Brain: No evidence of acute infarction, hemorrhage, hydrocephalus, extra-axial collection or mass lesion/mass effect. Diffuse cerebral atrophy. Patchy low-attenuation changes in the white matter consistent with small vessel ischemic changes. Vascular: Intracranial arterial vascular calcifications are present. Skull: The calvarium appears intact. Small left frontal subcutaneous scalp hematoma. Sinuses/Orbits: Mucosal thickening in the paranasal sinuses. No acute air-fluid levels. Mastoid air cells are clear. Previous bilateral ocular banding procedures. Other: Congenital nonunion of the posterior arch of C1. IMPRESSION: 1. No acute intracranial abnormalities. 2. Chronic atrophy and small vessel ischemic changes in the white matter. 3. Small left frontal subcutaneous scalp hematoma. Electronically Signed   By: Lucienne Capers M.D.   On: 08/21/2020 03:54   MR BRAIN WO CONTRAST  Result Date: 09/11/2020 CLINICAL DATA:  Stroke follow-up EXAM: MRI HEAD WITHOUT CONTRAST TECHNIQUE: Multiplanar, multiecho pulse sequences of the brain and surrounding structures were obtained without intravenous contrast. COMPARISON:  CTA head neck 09/11/2020 FINDINGS: Brain: There are bilateral watershed zone infarcts affecting both cerebral and cerebellar hemispheres. Largest confluent area of ischemia is in the right occipital lobe. Multifocal hyperintense T2-weighted signal within the white matter. There is generalized atrophy without lobar predilection. Multiple foci of chronic hemorrhage in the lateral right cerebellum. Normal midline structures. Vascular: Normal flow voids. Skull and upper cervical  spine: Normal marrow signal. Sinuses/Orbits: Negative.  Bilateral scleral bands. Other: None IMPRESSION: 1. 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 Largest confluent area of ischemia is in the right occipital lobe. 2. No acute hemorrhage or mass effect. 3. Generalized atrophy and chronic small vessel ischemic changes. Electronically Signed   By: Ulyses Jarred M.D.   On: 09/11/2020 02:37   CT CHEST ABDOMEN PELVIS W CONTRAST  Result Date: 08/21/2020 CLINICAL DATA:  Gastric mass in GI bleeding EXAM: CT CHEST, ABDOMEN, AND PELVIS WITH CONTRAST TECHNIQUE: Multidetector CT imaging of the chest, abdomen and pelvis was performed following the standard protocol during bolus administration of intravenous contrast. CONTRAST:  167m OMNIPAQUE IOHEXOL 300 MG/ML  SOLN COMPARISON:  12/31/2019 FINDINGS: CT CHEST FINDINGS Cardiovascular: Calcified atheromatous plaque in the thoracic aorta. 4.2 cm ascending aortic dilation, no comparison of this area. Central pulmonary vasculature, unremarkable on venous phase assessment. Calcifications of  the aortic valve and mitral annular calcification. Calcified coronary artery disease.  No pericardial fluid. Mediastinum/Nodes: No adenopathy in the chest. Esophagus mildly patulous. Thickening of the gastroesophageal junction. Lungs/Pleura: Background pulmonary emphysema. Mild subpleural reticulation. Scattered tree-in-bud opacities, for instance on image 47 of series 4 in the posterior RIGHT upper lobe. Biapical pleural and parenchymal scarring. Calcified nodule in the RIGHT mid chest is compatible with granuloma. Airways are patent. No consolidation. No pleural effusion. Musculoskeletal: No acute musculoskeletal process. Spinal degenerative changes in the thoracic spine. CT ABDOMEN PELVIS FINDINGS Hepatobiliary: Low-density hepatic lesion in the RIGHT hepatic lobe is stable compared to the prior study, well-circumscribed and showing  water density compatible with small cyst. Liver contours are smooth. No suspicious hepatic lesion. Portal vein is patent. Hepatic veins are patent. No pericholecystic stranding.  No biliary duct distension. Pancreas: No peripancreatic stranding. Cystic area in the pancreatic body 2.1 x 1.7 cm, previously 2.1 x 1.6 cm for Spleen: Areas of low attenuation in the peripheral spleen with wedge-shaped characteristics suggesting small splenic infarcts. More geographic area of low attenuation seen along the lateral margin of the spleen measuring approximately 12 mm. Adrenals/Urinary Tract: Adrenal glands are normal. Symmetric renal enhancement. Cortical scarring of bilateral kidneys. Mild fullness of the proximal RIGHT ureter is stable, no hydronephrosis. Stomach/Bowel: Stomach is under distended limiting assessment. No discrete mass is noted. There is diffuse gastric thickening with mural stratification. Significance uncertain though certainly could be seen in the setting of diffuse gastric neoplasm or with gastritis. Mild perigastric stranding may reflect changes of recent intervention Signs of colonic diverticulosis.  No sign of diverticulitis. Vascular/Lymphatic: Calcified and noncalcified atheromatous plaque of the abdominal aorta, extending into the iliac vessels. Small lymph nodes throughout the hepatic gastric ligament without pathologic enlargement in the 4-5 mm range. However, in the coronal plane there is diffuse nodular and hazy soft tissue stranding throughout the hepatic gastric ligament. Early local tumor infiltration is considered. No retroperitoneal adenopathy. No pelvic lymphadenopathy. Reproductive: Prostate with brachytherapy seeds in place. Other: No ascites. Musculoskeletal: No acute musculoskeletal finding. No destructive bone process. IMPRESSION: 1. Diffuse gastric thickening with mural stratification in this patient with known gastric neoplasm. Superimposed gastritis could accentuate this  appearance. 2. Coronal images display soft tissue infiltrating the hepatic gastric recess best seen on image 30 of series 94 where there is a focus extending from the wall of the stomach approximately 12 x 8 mm. Findings are concerning for early tumor infiltration beyond the wall the stomach. Hazy nodularity can be seen throughout the hepatic gastric ligament best seen on image 24 of series 6. Also some nodularity along short gastrics along the greater curvature of the stomach, also suspicious for extra gastric involvement. These nodules track along the gastrosplenic ligament there is general hazy nodularity in this area. 3. Stable cystic area in the pancreas, attention on follow-up. Dedicated MRI/MRCP could be considered as clinically indicated for further assessment in 6 months. 4. Areas of low attenuation in the peripheral spleen with wedge-shaped characteristics suggesting small splenic infarcts. More geographic area of low attenuation along the lateral margin of the spleen measuring approximately 12 mm. These may represent a small infarct. Consider close follow-up to exclude the possibility of small splenic lesion/metastasis though this would be unusual. MRI could be potentially helpful in differentiating these considerations. 5. Scattered tree-in-bud opacities in the posterior RIGHT upper lobe may reflect an infectious or inflammatory process. Recommend attention on follow-up. 6. 4.2 cm ascending aortic dilation, compatible with mild aneurysmal dilation.  Recommend annual imaging followup by CTA or MRA. This recommendation follows 2010 ACCF/AHA/AATS/ACR/ASA/SCA/SCAI/SIR/STS/SVM Guidelines for the Diagnosis and Management of Patients with Thoracic Aortic Disease. Circulation. 2010; 121: V761-Y073. Aortic aneurysm NOS (ICD10-I71.9) 7. Prostate with brachytherapy seeds in place. 8. Emphysema and aortic atherosclerosis. Aortic Atherosclerosis (ICD10-I70.0) and Emphysema (ICD10-J43.9). Electronically Signed   By:  Zetta Bills M.D.   On: 08/21/2020 17:52   EEG adult  Result Date: 09/12/2020 Lora Havens, MD     09/12/2020 12:16 PM Patient Name: Candler Ginsberg MRN: [id number] Epilepsy Attending: Lora Havens Referring Physician/Provider: Dr. Rosalin Hawking Date: 10/4/121 Duration: 25.25 minutes Patient history: 86 year old male who initially presented with lethargy, altered mental status, speech difficulties, left hemianopsia Sia and right upper extremity weakness.  MRI showed extensive bilateral watershed zone infarcts affecting both cerebral and cerebellar hemispheres.  EEG to evaluate for seizures. Level of alertness: Awake AEDs during EEG study: Gabapentin Technical aspects: This EEG study was done with scalp electrodes positioned according to the 10-20 International system of electrode placement. Electrical activity was acquired at a sampling rate of _0  and reviewed with a high frequency filter of _1  and a low frequency filter of _2 . EEG data were recorded continuously and digitally stored. Description: No definite posterior dominant rhythm was seen.  EEG showed continuous generalized polymorphic 5 to 6 Hz theta slowing as well as intermittent 2 to 3 Hz delta slowing.  Hyperventilation and photic stimulation were not performed.   ABNORMALITY -Continuous slow, generalized IMPRESSION: This study is suggestive of moderate diffuse encephalopathy, nonspecific etiology.  No seizures or epileptiform discharges were seen throughout the recording. Lora Havens   ECHOCARDIOGRAM COMPLETE  Result Date: 09/11/2020    ECHOCARDIOGRAM REPORT   Patient Name:   DEMETRIUS MAHLER Date of Exam: 09/11/2020 Medical Rec #:  [id number]    Height:       68.5 in Accession #:    [id number]   Weight:       150.0 lb Date of Birth:  Mar 16, 1930     BSA:          1.818 m Patient Age:    85 years     BP:           140/71 mmHg Patient Gender: M            HR:           80 bpm. Exam Location:  Inpatient Procedure: 2D Echo, Cardiac Doppler and  Color Doppler Indications:    Stroke  History:        Patient has prior history of Echocardiogram examinations, most                 recent 02/29/2020. Aortic Valve Disease; Risk                 Factors:Dyslipidemia. CKD.  Sonographer:    Clayton Lefort RDCS (AE) Referring Phys: [id number] Cleaster Corin PATEL  Sonographer Comments: Technically challenging study due to patient movement. IMPRESSIONS  1. Left ventricular ejection fraction, by estimation, is 60 to 65%. The left ventricle has normal function. The left ventricle has no regional wall motion abnormalities. There is severe left ventricular hypertrophy. Left ventricular diastolic parameters  are consistent with Grade I diastolic dysfunction (impaired relaxation).  2. Right ventricular systolic function is normal. The right ventricular size is normal.  3. The mitral valve is normal in structure. Trivial mitral valve regurgitation. No evidence of mitral stenosis. Moderate mitral annular calcification.  4. The aortic valve  is abnormal. There is severe calcifcation of the aortic valve. There is severe thickening of the aortic valve. Aortic valve regurgitation is moderate. Moderate to severe aortic valve stenosis. Aortic valve mean gradient measures 32.7  mmHg. Aortic valve peak gradient measures 57.9 mmHg. Aortic valve area, by VTI measures 0.76 cm.  5. The inferior vena cava is normal in size with greater than 50% respiratory variability, suggesting right atrial pressure of 3 mmHg. FINDINGS  Left Ventricle: Left ventricular ejection fraction, by estimation, is 60 to 65%. The left ventricle has normal function. The left ventricle has no regional wall motion abnormalities. The left ventricular internal cavity size was normal in size. There is  severe left ventricular hypertrophy. Left ventricular diastolic parameters are consistent with Grade I diastolic dysfunction (impaired relaxation). Normal left ventricular filling pressure. Right Ventricle: The right ventricular size is  normal. No increase in right ventricular wall thickness. Right ventricular systolic function is normal. Left Atrium: Left atrial size was normal in size. Right Atrium: Right atrial size was normal in size. Pericardium: There is no evidence of pericardial effusion. Mitral Valve: The mitral valve is normal in structure. There is moderate thickening of the mitral valve leaflet(s). There is moderate calcification of the mitral valve leaflet(s). Moderate mitral annular calcification. Trivial mitral valve regurgitation.  No evidence of mitral valve stenosis. Tricuspid Valve: The tricuspid valve is normal in structure. Tricuspid valve regurgitation is not demonstrated. No evidence of tricuspid stenosis. Aortic Valve: The aortic valve is abnormal. There is severe calcifcation of the aortic valve. There is severe thickening of the aortic valve. There is severe aortic valve annular calcification. Aortic valve regurgitation is moderate. Aortic regurgitation  PHT measures 225 msec. Moderate to severe aortic stenosis is present. Aortic valve mean gradient measures 32.7 mmHg. Aortic valve peak gradient measures 57.9 mmHg. Aortic valve area, by VTI measures 0.76 cm. Pulmonic Valve: The pulmonic valve was not well visualized. Pulmonic valve regurgitation is not visualized. No evidence of pulmonic stenosis. Aorta: The aortic root is normal in size and structure. Pulmonary Artery: Indeterminant PASP, inadequate TR jet. Venous: The inferior vena cava is normal in size with greater than 50% respiratory variability, suggesting right atrial pressure of 3 mmHg. IAS/Shunts: No atrial level shunt detected by color flow Doppler.  LEFT VENTRICLE PLAX 2D LVIDd:         4.50 cm  Diastology LVIDs:         3.00 cm  LV e' medial:    4.79 cm/s LV PW:         1.50 cm  LV E/e' medial:  10.6 LV IVS:        1.50 cm  LV e' lateral:   6.74 cm/s LVOT diam:     2.00 cm  LV E/e' lateral: 7.6 LV SV:         63 LV SV Index:   34 LVOT Area:     3.14 cm   RIGHT VENTRICLE            IVC RV Basal diam:  3.40 cm    IVC diam: 0.80 cm RV S prime:     9.14 cm/s TAPSE (M-mode): 2.1 cm LEFT ATRIUM           Index       RIGHT ATRIUM           Index LA diam:      2.40 cm 1.32 cm/m  RA Area:     16.40 cm LA Vol (A2C): 38.8  ml 21.34 ml/m RA Volume:   41.50 ml  22.82 ml/m LA Vol (A4C): 40.0 ml 22.00 ml/m  AORTIC VALVE AV Area (Vmax):    0.74 cm AV Area (Vmean):   0.71 cm AV Area (VTI):     0.76 cm AV Vmax:           380.52 cm/s AV Vmean:          269.609 cm/s AV VTI:            0.820 m AV Peak Grad:      57.9 mmHg AV Mean Grad:      32.7 mmHg LVOT Vmax:         89.10 cm/s LVOT Vmean:        60.750 cm/s LVOT VTI:          0.199 m LVOT/AV VTI ratio: 0.24 AI PHT:            225 msec  AORTA Ao Root diam: 3.70 cm MITRAL VALVE MV Area (PHT): 3.65 cm    SHUNTS MV Decel Time: 208 msec    Systemic VTI:  0.20 m MV E velocity: 50.90 cm/s  Systemic Diam: 2.00 cm MV A velocity: 89.20 cm/s MV E/A ratio:  0.57 Carlyle Dolly MD Electronically signed by Carlyle Dolly MD Signature Date/Time: 09/11/2020/9:32:47 AM    Final    CT ANGIO HEAD CODE STROKE  Result Date: 09/11/2020 CLINICAL DATA:  Aphasia EXAM: CT ANGIOGRAPHY HEAD AND NECK TECHNIQUE: Multidetector CT imaging of the head and neck was performed using the standard protocol during bolus administration of intravenous contrast. Multiplanar CT image reconstructions and MIPs were obtained to evaluate the vascular anatomy. Carotid stenosis measurements (when applicable) are obtained utilizing NASCET criteria, using the distal internal carotid diameter as the denominator. CONTRAST:  54m OMNIPAQUE IOHEXOL 350 MG/ML SOLN COMPARISON:  None. FINDINGS: CTA NECK FINDINGS SKELETON: There is no bony spinal canal stenosis. No lytic or blastic lesion. OTHER NECK: Normal pharynx, larynx and major salivary glands. No cervical lymphadenopathy. Unremarkable thyroid gland. UPPER CHEST: No pneumothorax or pleural effusion. No nodules or masses.  AORTIC ARCH: There is mild calcific atherosclerosis of the aortic arch. There is no aneurysm, dissection or hemodynamically significant stenosis of the visualized portion of the aorta. Conventional 3 vessel aortic branching pattern. The visualized proximal subclavian arteries are widely patent. RIGHT CAROTID SYSTEM: No dissection, occlusion or aneurysm. Mild atherosclerotic calcification at the carotid bifurcation without hemodynamically significant stenosis. LEFT CAROTID SYSTEM: No dissection, occlusion or aneurysm. Mild atherosclerotic calcification at the carotid bifurcation without hemodynamically significant stenosis. VERTEBRAL ARTERIES: Codominant configuration. Both origins are clearly patent. There is no dissection, occlusion or flow-limiting stenosis to the skull base (V1-V3 segments). CTA HEAD FINDINGS POSTERIOR CIRCULATION: --Vertebral arteries: Normal V4 segments. --Inferior cerebellar arteries: Normal. --Basilar artery: Normal. --Superior cerebellar arteries: Normal. --Posterior cerebral arteries (PCA): Normal. ANTERIOR CIRCULATION: --Intracranial internal carotid arteries: Atherosclerotic calcification of the internal carotid arteries at the skull base without hemodynamically significant stenosis. --Anterior cerebral arteries (ACA): Normal. Both A1 segments are present. Patent anterior communicating artery (a-comm). --Middle cerebral arteries (MCA): Normal. VENOUS SINUSES: As permitted by contrast timing, patent. ANATOMIC VARIANTS: None Review of the MIP images confirms the above findings. IMPRESSION: 1. No emergent large vessel occlusion or high-grade stenosis of the intracranial arteries. 2. Mild bilateral carotid bifurcation atherosclerosis without hemodynamically significant stenosis by NASCET criteria. Aortic Atherosclerosis (ICD10-I70.0). Electronically Signed   By: KUlyses JarredM.D.   On: 09/11/2020 00:30   CT ANGIO NECK CODE  STROKE  Result Date: 09/11/2020 CLINICAL DATA:  Aphasia EXAM: CT  ANGIOGRAPHY HEAD AND NECK TECHNIQUE: Multidetector CT imaging of the head and neck was performed using the standard protocol during bolus administration of intravenous contrast. Multiplanar CT image reconstructions and MIPs were obtained to evaluate the vascular anatomy. Carotid stenosis measurements (when applicable) are obtained utilizing NASCET criteria, using the distal internal carotid diameter as the denominator. CONTRAST:  34m OMNIPAQUE IOHEXOL 350 MG/ML SOLN COMPARISON:  None. FINDINGS: CTA NECK FINDINGS SKELETON: There is no bony spinal canal stenosis. No lytic or blastic lesion. OTHER NECK: Normal pharynx, larynx and major salivary glands. No cervical lymphadenopathy. Unremarkable thyroid gland. UPPER CHEST: No pneumothorax or pleural effusion. No nodules or masses. AORTIC ARCH: There is mild calcific atherosclerosis of the aortic arch. There is no aneurysm, dissection or hemodynamically significant stenosis of the visualized portion of the aorta. Conventional 3 vessel aortic branching pattern. The visualized proximal subclavian arteries are widely patent. RIGHT CAROTID SYSTEM: No dissection, occlusion or aneurysm. Mild atherosclerotic calcification at the carotid bifurcation without hemodynamically significant stenosis. LEFT CAROTID SYSTEM: No dissection, occlusion or aneurysm. Mild atherosclerotic calcification at the carotid bifurcation without hemodynamically significant stenosis. VERTEBRAL ARTERIES: Codominant configuration. Both origins are clearly patent. There is no dissection, occlusion or flow-limiting stenosis to the skull base (V1-V3 segments). CTA HEAD FINDINGS POSTERIOR CIRCULATION: --Vertebral arteries: Normal V4 segments. --Inferior cerebellar arteries: Normal. --Basilar artery: Normal. --Superior cerebellar arteries: Normal. --Posterior cerebral arteries (PCA): Normal. ANTERIOR CIRCULATION: --Intracranial internal carotid arteries: Atherosclerotic calcification of the internal carotid  arteries at the skull base without hemodynamically significant stenosis. --Anterior cerebral arteries (ACA): Normal. Both A1 segments are present. Patent anterior communicating artery (a-comm). --Middle cerebral arteries (MCA): Normal. VENOUS SINUSES: As permitted by contrast timing, patent. ANATOMIC VARIANTS: None Review of the MIP images confirms the above findings. IMPRESSION: 1. No emergent large vessel occlusion or high-grade stenosis of the intracranial arteries. 2. Mild bilateral carotid bifurcation atherosclerosis without hemodynamically significant stenosis by NASCET criteria. Aortic Atherosclerosis (ICD10-I70.0). Electronically Signed   By: KUlyses JarredM.D.   On: 09/11/2020 00:30   Assessment and Plan:  1.  Locally advanced gastric adenocarcinoma 2.  Acute CVA 3.  Anemia secondary to underlying GI malignancy/GI bleed 4.  Paroxysmal atrial fibrillation 5.  Hypertension 6.  Hyperlipidemia 7.  Hypothyroidism 8.  Stage III CKD 9.  Asthma 10.  Aortic stenosis  -The patient has a receiving palliative radiation for control of his GI bleed.  This has now been placed on hold secondary to hospitalization.  He is not a good candidate for chemotherapy due to poor performance status.  Herceptin had been considered following radiation and if performance status improves.  However, given acute CVA and poor performance status, I do not think he is a candidate for Herceptin. -I have talked with the patient's son at the bedside about ACLS measures and disposition plan.  We talked in detail regarding what ACLS would entail and I have recommended for the patient to change CODE STATUS to DNR.  The patient's son understands and is in agreement, but defers the final decision to the patient's wife.  Palliative care has been speaking to her about CODE STATUS but a final decision has not yet been made.  We also discussed hospice including home with hospice versus residential hospice.  Further discussion regarding  disposition will be ongoing. -Appreciate assistance from neurology.  They have ordered aspirin, but the patient cannot tolerate.  Okay to hold aspirin from our standpoint. -The patient's  hemoglobin has been stable overall this admission.  He has not required any blood transfusions this admission.  No active bleeding has been reported.  Recommend continued close monitoring for recurrent melena or hematochezia and transfuse for hemoglobin less than 8 pending further goals of care discussion.   Mikey Bussing, DNP, AGPCNP-BC, AOCNP  ADDENDUM: I saw and examined Mr. Ashton this morning.  He clearly has significant neurological deficits.  He really cannot move his right arm.  He seems to be favoring his left side little bit.,  Sure his vision is all that good anymore.  I think he is "declared himself."  He just is not a candidate now for any therapy for his gastric cancer.  I am sure that this CVA is somehow related to him having malignancy and probably being hypercoagulable.  He is not a candidate for anticoagulation because of the GI bleeding that he has had because of his malignancy.  I know he is starting some radiation treatments.  I am not sure how much he has had.  At this point, we really have to focus on his comfort.  I really do not see that we need to put him through any further radiation.  I clearly favor getting Hospice involved.  I really hope that he is able to go home.  I am not sure how much his wife is going be able to help him.  She is quite mature herself.  I am not sure he really understands the concepts of mechanical ventilation.  I tried to talk to him about this but I does not think that he was really cognizant of what I was saying.  I thought that we might be able to help him out with his cancer.  He did have HER-2 positivity with his cancer and typically these tumors respond pretty well.  However, his performance status (ECOG 3-4) is just not good enough for him to take any  therapy at this point.  Again, I will have to talk to his wife.  Hopefully, she will agree that he does not need to be kept alive artificially.  I think if he were to be put on life support, he would just suffer and would never improve.  I just feel bad for Mr. Aldea.  He is a nice guy.  He served our country.  He is a true American hero.  I know that he is getting great care from all staff up on [address].  Lattie Haw, MD  Philippians 4:4

## 2020-09-13 NOTE — Progress Notes (Signed)
Palliative Medicine RN Note: Pt is awake, RNs at bedside. Patient is oriented to person only. He denies physical pain, c/o "mental pain," but cannot describe. RN reports he took meds crushed but immediated vomited them up. Pt denies nausea.   Report to Mariana Kaufman, NP.  Marjie Skiff [place] Skal[place], RN, BSN, [facility] Palliative Medicine Team 09/13/2020 11:52 AM Office [phone]

## 2020-09-13 NOTE — Progress Notes (Signed)
Palliative-   Spoke again with Roy David- Inquired about MOST form and code status per outpatient Palliative note- Roy David states it was not completed- and while she did not think patient would want full code- he indicated in ED that he did when the doctor asked him - noted that patient may have been confused in ED due to altered mental status and stroke.  At conclusion of call Roy David stated she just wanted to take today to gather herself and not make any decisions. She agreed to follow-up call tomorrow.   Mariana Kaufman, AGNP-C Palliative Medicine  Please call Palliative Medicine team phone with any questions [phone]. For individual providers please see AMION.  Additional non face to face time: 35 MINS

## 2020-09-13 NOTE — Progress Notes (Signed)
PROGRESS NOTE    Roy David  BOF:[id number] DOB: October 01, 1930 DOA: 09/10/2020 PCP: [name], L.Marlou Sa, MD (Confirm with patient/family/NH records and if not entered, this HAS to be entered at [facility] point of entry. "No PCP" if truly none.)   Brief Narrative: (Start on day 1 of progress note - keep it brief and live)  Patient is a 86 year old Caucasian male with history of hypertension, hyperlipidemia, paroxysmal atrial fibrillation, recently diagnosed gastric cancer undergoing radiation treatment, hypothyroidism, anemia and ED stage III presented to ED for evaluation of altered mental status.  In ED patient was found to have multiple acute versus subacute strokes.MRI brain revealed bilateral extensive watershed infarcts in the cerebral and cerebellar hemisphere with generalized atrophy and chronic small vessel ischemic changes.  CT angiogram head and neck was negative for large vessel occlusion or stenosis.  Neurology started patient on aspirin and statin. Echocardiogram showed grade 2 severe aortic stenosis but cardiology  recommended outpatient follow-up and does not think that patient is a candidate for TAVR secondary to ongoing GI bleed and prognosis. Patient's wife is interested in palliative care.  Assessment & Plan:   Principal Problem:   Acute CVA (cerebrovascular accident) [facility]) Active Problems:   Essential hypertension   Hypercholesterolemia   Paroxysmal atrial fibrillation (HCC)   Anemia   Gastric cancer (HCC)   CVA (cerebral vascular accident) ([place])   Protein-calorie malnutrition, severe   Advanced care planning/counseling discussion   Palliative care by specialist  Acute CVA Patient is admitted for further stroke work-up according to neurology recommendations after was positive for acute/subacute infarct involving the right occipital lobe.  MRI brain revealed bilateral extensive watershed infarcts in the cerebral and cerebellar hemisphere with generalized atrophy and chronic small  vessel ischemic changes.  CT angiogram head and neck was negative for large vessel occlusion or stenosis.  Echocardiogram showed grade 2 severe aortic stenosis.  Patient was started on aspirin 81 mg with close monitoring because of recent GI bleed.  Continue statin.  Lipid panel showed low HDL while total cholesterol, triglycerides and VLDL within normal limits.  Hemoglobin A1c 4.6.  PT/OT is evaluating and treating the patient and recommended skilled nursing facility.  Patient's was present at the bedside and expressed interest in speaking with palliative care..  Palliative care consult placed.  Gastric cancer undergoing radiation treatment Patient is undergoing radiation therapy and completed 7 out of 15 treatments.  Patient's family is not interested in further treatments. Patient recently had GI bleeding s/p blood transfusions.  Continue Protonix and Carafate.  Continue to monitor for GI bleed because patient is recently started on aspirin secondary to stroke.  Paroxysmal atrial fibrillation not on anticoagulation Stable.  No anticoagulation because of recent GI bleed.  Patient is on aspirin.  Hypokalemia Potassium is 3.1 today.  Potassium supplementation ordered.  Dehydration most likely secondary to nausea and poor oral intake. Patient had an episode of vomiting today.  Patient looks very sick and dehydrated.  A bolus of 500 mL normal saline ordered.  Continue IV normal saline at a rate of 75 mL/h.  Megace ordered for poor appetite.  Anemia with recent GI bleeding Hemoglobin is 8.9 today .  Continue to monitor  Anorexia: Most likely secondary to underlying cancer. Megace 400mg  BID ordered  Hypertension Stable  Hyperlipidemia Continue Zetia and statin  Hypothyroidism Synthroid  Asthma Albuterol as needed  CKD stage III Continue to monitor creatinine level.  Aortic stenosis: Echocardiogram showed grade 2 severe aortic stenosis.  Patient case discussed with  cardiology who recommended outpatient follow-up and does not think that patient is a candidate for TAVR secondary to ongoing GI bleed and prognosis.  Palliative care consult : Palliative care consult placed yesterday on patient's wife request.  Patient seen by nurse practitioner from palliative care and according to her recommendations patient is hospice eligible and will be evaluated further to see if [facility] places possibility.   DVT prophylaxis: Knee SCDs Code Status: Full code Family Communication: Patient's son present at the bedside Disposition Plan: Yet to be decided   Consultants:   Neurology  Procedures: (Don't include imaging studies which can be auto populated. Include things that cannot be auto populated i.e. Echo, Carotid and venous dopplers, Foley, Bipap, HD, tubes/drains, wound vac, central lines etc)  Echocardiogram  Antimicrobials: (specify start and planned stop date. Auto populated tables are space occupying and do not give end dates)     Subjective: Patient is seen and evaluated at the bedside this morning.  Patient's nurse reported that he was declining to take his medications and was very upset this morning.  Patient had an episode of vomiting after taking his medications.  Patient is seen at the bedside this morning and patient communicates without opening his eyes.  Patient denies any symptoms at this time but looks weak, tired and dehydrated.  Objective: Vitals:   09/13/20 0349 09/13/20 0808 09/13/20 1224 09/13/20 1347  BP: 129/75 (!) 142/82 125/74   Pulse: 81 92 94   Resp: 16 20    Temp: 98.6 F (37 C) 99.7 F (37.6 C) (!) 100.8 F (38.2 C) 98.9 F (37.2 C)  TempSrc:  Oral Axillary Axillary  SpO2: 97% 98% 96%   Weight:      Height:        Intake/Output Summary (Last 24 hours) at 09/13/2020 1620 Last data filed at 09/13/2020 0430 Gross per 24 hour  Intake 750 ml  Output 400 ml  Net 350 ml   Filed Weights   09/10/20 1935  Weight: 68 kg     Examination:  General exam: Patient looks cachectic and sick. Respiratory system: Clear to auscultation. Respiratory effort normal. Cardiovascular system: S1 & S2 heard, RRR. No JVD, murmurs, rubs, gallops or clicks. No pedal edema. Gastrointestinal system: Abdomen is nondistended, soft and nontender. No organomegaly or masses felt. Normal bowel sounds heard. Central nervous system: Alert and oriented. No focal neurological deficits. Extremities: Symmetric 5 x 5 power. Skin: No rashes, lesions or ulcers Psychiatry: Judgement and insight appear normal.  Patient looks depressed    Data Reviewed: I have personally reviewed following labs and imaging studies  CBC: Recent Labs  Lab 09/10/20 2205 09/12/20 1144 09/13/20 0153  WBC 6.1 5.4 7.5  HGB 9.1* 8.7* 8.9*  HCT 29.3* 27.9* 28.4*  MCV 91.6 91.5 92.8  PLT 263 218 283   Basic Metabolic Panel: Recent Labs  Lab 09/10/20 2205 09/13/20 0153  NA 138 143  K 4.1 3.1*  CL 105 108  CO2 22 25  GLUCOSE 100* 129*  BUN 20 15  CREATININE 1.51* 1.41*  CALCIUM 9.2 8.9  MG  --  1.7   GFR: Estimated Creatinine Clearance: 34.8 mL/min (A) (by C-G formula based on SCr of 1.41 mg/dL (H)). Liver Function Tests: Recent Labs  Lab 09/10/20 2205  AST 39  ALT 13  ALKPHOS 57  BILITOT 1.1  PROT 5.8*  ALBUMIN 2.8*   No results for input(s): LIPASE, AMYLASE in the last 168 hours. No results for input(s): AMMONIA in the  last 168 hours. Coagulation Profile: Recent Labs  Lab 09/10/20 2205  INR 1.1   Cardiac Enzymes: No results for input(s): CKTOTAL, CKMB, CKMBINDEX, TROPONINI in the last 168 hours. BNP (last 3 results) No results for input(s): PROBNP in the last 8760 hours. HbA1C: Recent Labs    09/11/20 0448  HGBA1C 4.6*   CBG: Recent Labs  Lab 09/10/20 2212  GLUCAP 97   Lipid Profile: Recent Labs    09/11/20 0449  CHOL 134  HDL 34*  LDLCALC 78  TRIG 108  CHOLHDL 3.9   Thyroid Function Tests: No results for  input(s): TSH, T4TOTAL, FREET4, T3FREE, THYROIDAB in the last 72 hours. Anemia Panel: No results for input(s): VITAMINB12, FOLATE, FERRITIN, TIBC, IRON, RETICCTPCT in the last 72 hours. Sepsis Labs: No results for input(s): PROCALCITON, LATICACIDVEN in the last 168 hours.  Recent Results (from the past 240 hour(s))  Respiratory Panel by RT PCR (Flu A&B, Covid) - Nasopharyngeal Swab     Status: None   Collection Time: 09/10/20 11:51 PM   Specimen: Nasopharyngeal Swab  Result Value Ref Range Status   SARS Coronavirus 2 by RT PCR NEGATIVE NEGATIVE Final    Comment: (NOTE) SARS-CoV-2 target nucleic acids are NOT DETECTED.  The SARS-CoV-2 RNA is generally detectable in upper respiratoy specimens during the acute phase of infection. The lowest concentration of SARS-CoV-2 viral copies this assay can detect is 131 copies/mL. A negative result does not preclude SARS-Cov-2 infection and should not be used as the sole basis for treatment or other patient management decisions. A negative result may occur with  improper specimen collection/handling, submission of specimen other than nasopharyngeal swab, presence of viral mutation(s) within the areas targeted by this assay, and inadequate number of viral copies (<131 copies/mL). A negative result must be combined with clinical observations, patient history, and epidemiological information. The expected result is Negative.  Fact Sheet for Patients:  PinkCheek.be  Fact Sheet for Healthcare Providers:  GravelBags.it  This test is no t yet approved or cleared by the [place] FDA and  has been authorized for detection and/or diagnosis of SARS-CoV-2 by FDA under an Emergency Use Authorization (EUA). This EUA will remain  in effect (meaning this test can be used) for the duration of the COVID-19 declaration under Section 564(b)(1) of the Act, 21 U.S.C. section 360bbb-3(b)(1), unless the  authorization is terminated or revoked sooner.     Influenza A by PCR NEGATIVE NEGATIVE Final   Influenza B by PCR NEGATIVE NEGATIVE Final    Comment: (NOTE) The Xpert Xpress SARS-CoV-2/FLU/RSV assay is intended as an aid in  the diagnosis of influenza from Nasopharyngeal swab specimens and  should not be used as a sole basis for treatment. Nasal washings and  aspirates are unacceptable for Xpert Xpress SARS-CoV-2/FLU/RSV  testing.  Fact Sheet for Patients: PinkCheek.be  Fact Sheet for Healthcare Providers: GravelBags.it  This test is not yet approved or cleared by the [place] FDA and  has been authorized for detection and/or diagnosis of SARS-CoV-2 by  FDA under an Emergency Use Authorization (EUA). This EUA will remain  in effect (meaning this test can be used) for the duration of the  Covid-19 declaration under Section 564(b)(1) of the Act, 21  U.S.C. section 360bbb-3(b)(1), unless the authorization is  terminated or revoked. Performed at [facility], [address] [place], [place] [address]          Radiology Studies: EEG adult  Result Date: 10/01/2020 Lora Havens, MD  09/12/2020 12:16 PM Patient Name: Keddrick Wyne MRN: [id number] Epilepsy Attending: Lora Havens Referring Physician/Provider: Dr. Rosalin Hawking Date: 10/4/121 Duration: 25.25 minutes Patient history: 86 year old male who initially presented with lethargy, altered mental status, speech difficulties, left hemianopsia Sia and right upper extremity weakness.  MRI showed extensive bilateral watershed zone infarcts affecting both cerebral and cerebellar hemispheres.  EEG to evaluate for seizures. Level of alertness: Awake AEDs during EEG study: Gabapentin Technical aspects: This EEG study was done with scalp electrodes positioned according to the 10-20 International system of electrode placement. Electrical activity was acquired at a  sampling rate of 500Hz  and reviewed with a high frequency filter of 70Hz  and a low frequency filter of 1Hz . EEG data were recorded continuously and digitally stored. Description: No definite posterior dominant rhythm was seen.  EEG showed continuous generalized polymorphic 5 to 6 Hz theta slowing as well as intermittent 2 to 3 Hz delta slowing.  Hyperventilation and photic stimulation were not performed.   ABNORMALITY -Continuous slow, generalized IMPRESSION: This study is suggestive of moderate diffuse encephalopathy, nonspecific etiology.  No seizures or epileptiform discharges were seen throughout the recording. Priyanka Barbra Sarks        Scheduled Meds: . aspirin EC  81 mg Oral Daily  . ezetimibe  10 mg Oral Daily  . feeding supplement (ENSURE ENLIVE)  237 mL Oral TID BM  . gabapentin  300 mg Oral Daily  . levothyroxine  100 mcg Oral Q0600  . megestrol  400 mg Oral BID  . multivitamin with minerals  1 tablet Oral Daily  . pantoprazole  40 mg Oral BID  . simvastatin  20 mg Oral q1800  . sucralfate  1 g Oral TID  . tetrahydrozoline  1 drop Both Eyes BID   Continuous Infusions: . dextrose 5% lactated ringers 75 mL/hr at 09/13/20 0433     LOS: 2 days        Edmonia Lynch, MD [facility] Pager [phone]  If 7PM-7AM, please contact night-coverage [email or web address] Password  09/13/2020, 4:20 PM

## 2020-09-13 NOTE — Progress Notes (Signed)
Per pt son and wife, pt seemed more willing to take meds yesterday. But today is a change and he is more irritable and refusing some of his meds. Patient had vomiting episode 10 mins after meds were given as well. RN went to assess patient and patient stated that he feels "mentally unwell. DO Humphrey Rolls has been paged and made aware.

## 2020-09-13 NOTE — Consult Note (Signed)
Consultation Note Date: 09/13/2020   Patient Name: Roy David  DOB: 17-Aug-1930  MRN: [id number]  Age / Sex: 86 y.o., male  PCP: [name], L.Marlou Sa, MD Referring Physician: Edmonia Lynch, DO  Reason for Consultation: Establishing goals of care  HPI/Patient Profile: 86 y.o. male  with past medical history of asthma, aortic stenosis, TIA, retinal vein thrombus (on anticoag), locally advanced gastric cancer with ongoing significant GI bleeding- currently on radiation therapy with plans to start immunotherapy after,  admitted on 09/10/2020 with altered mental status (lethargy, speech difficulties, L hemianopsia, RUE weakness)-  Workup reveals extensive watershed infarcts, L PCA infarct (subacute, likely due to hypoperfusion). Palliative consulted per patient's spouse's request.   Clinical Assessment and Goals of Care: Received report from patient's nurse- patient is oriented to self only- his oral intake is poor. Requires total assistance for ADL's. SNF has been recommended- however, spouse has expressed she would prefer to take him home.  Patient has been seen by [facility] outpatient Palliative- most recent note indicates patient has completed MOST form with choices of - DNR, Comfort measures, trial IV fluids- no feeding tube, determine use of limitation of antibiotics at time. They are also interested in Hospice/comfort care when this is deemed appropriate.  Called patient's spouse for discussion- she notes feeling very overwhelmed- yesterday she was ready to commit to taking him home, however, today she is concerned as to if she can properly care for hi in the home.  She agrees that his time is likely limited- given his poor functional status, he's not eating, no plans to continue radiation treatments for his cancer.  I discussed with her role of Hospice and assistance that Hospice could provide in the home.  She  called Hospice last night on her own.  Prior to the conclusion of our discussion she received a phone call from someone she was waiting for and requested to continue discussion at a later time.    Primary Decision Maker NEXT OF KIN- spouse    SUMMARY OF RECOMMENDATIONS - Patient is hospice eligible- will eval further to see if [facility] is a possibility -Will confirm DNR and MOST form selections when I speak with spouse again     Code Status/Advance Care Planning:  Full code- however,  Likely DNR based on previous notes  Palliative Prophylaxis:   Delirium Protocol  Additional Recommendations (Limitations, Scope, Preferences):  No Artificial Feeding  Prognosis:    Unable to determine  Discharge Planning: To Be Determined  Primary Diagnoses: Present on Admission: . Acute CVA (cerebrovascular accident) ([place]) . Anemia . Essential hypertension . Gastric cancer ([place]) . Hypercholesterolemia . Paroxysmal atrial fibrillation ([facility]) . CVA (cerebral vascular accident) ([place])   I have reviewed the medical record, interviewed the patient and family, and examined the patient. The following aspects are pertinent.  Past Medical History:  Diagnosis Date  . Arthritis   . Asthma    followed at [facility], stable on Asmanex  . Diverticulosis   . Dysrhythmia   . Gastric cancer ([place]) 08/25/2020  .  Goals of care, counseling/discussion 08/25/2020  . H/O seasonal allergies    Dr [name]  . Hypercholesteremia   . Hypertension   . Hypochromic-microcytic anemia   . Microcytic anemia   . Paroxysmal atrial fibrillation ([place]) 03/06/2016   on Xarelto  . Prostate cancer [facility])    12/10- Dr Dahlstedt/Dr Valere Dross  . Thyroid disease    hypothyroidism   Social History   Socioeconomic History  . Marital status: Married    Spouse name: Not on file  . Number of children: Not on file  . Years of education: Not on file  . Highest education level: Not on file  Occupational History  . Occupation:  retired  Tobacco Use  . Smoking status: Former Smoker    Quit date: 12/09/1959    Years since quitting: 60.8  . Smokeless tobacco: Never Used  Vaping Use  . Vaping Use: Never used  Substance and Sexual Activity  . Alcohol use: Yes    Alcohol/week: 14.0 standard drinks    Types: 7 Shots of liquor, 7 Cans of beer per week  . Drug use: No  . Sexual activity: Not on file  Other Topics Concern  . Not on file  Social History Narrative   Tobacco use cigarettes : Former smoker, Quit in year 1955   Occupation : retired/  married   Social Determinants of Radio broadcast assistant Strain:   . Difficulty of Paying Living Expenses: Not on file  Food Insecurity:   . Worried About Charity fundraiser in the Last Year: Not on file  . Ran Out of Food in the Last Year: Not on file  Transportation Needs:   . Lack of Transportation (Medical): Not on file  . Lack of Transportation (Non-Medical): Not on file  Physical Activity:   . Days of Exercise per Week: Not on file  . Minutes of Exercise per Session: Not on file  Stress:   . Feeling of Stress : Not on file  Social Connections:   . Frequency of Communication with Friends and Family: Not on file  . Frequency of Social Gatherings with Friends and Family: Not on file  . Attends Religious Services: Not on file  . Active Member of Clubs or Organizations: Not on file  . Attends Archivist Meetings: Not on file  . Marital Status: Not on file   Scheduled Meds: . aspirin EC  81 mg Oral Daily  . ezetimibe  10 mg Oral Daily  . feeding supplement (ENSURE ENLIVE)  237 mL Oral TID BM  . gabapentin  300 mg Oral Daily  . levothyroxine  100 mcg Oral Q0600  . megestrol  400 mg Oral BID  . multivitamin with minerals  1 tablet Oral Daily  . pantoprazole  40 mg Oral BID  . simvastatin  20 mg Oral q1800  . sucralfate  1 g Oral TID  . tetrahydrozoline  1 drop Both Eyes BID   Continuous Infusions: . dextrose 5% lactated ringers 75 mL/hr at  09/13/20 0433   PRN Meds:.acetaminophen **OR** acetaminophen (TYLENOL) oral liquid 160 mg/5 mL **OR** acetaminophen, albuterol, senna-docusate Medications Prior to Admission:  Prior to Admission medications   Medication Sig Start Date End Date Taking? Authorizing Provider  doxycycline (MONODOX) 100 MG capsule Take 100 mg by mouth 2 (two) times daily as needed (to prevent infection during asthma flares).    Yes [provider]  EPINEPHrine 0.3 mg/0.3 mL IJ SOAJ injection Inject 0.3 mg into the muscle  once as needed for anaphylaxis (yellow jacket stings).    Yes [provider]  ezetimibe-simvastatin (VYTORIN) 10-20 MG per tablet Take 1 tablet by mouth every evening.    Yes [provider]  gabapentin (NEURONTIN) 300 MG capsule Take 300 mg by mouth daily.    Yes [provider]  levothyroxine (SYNTHROID, LEVOTHROID) 100 MCG tablet Take 100 mcg by mouth daily.  10/06/15  Yes [provider]  mometasone (ASMANEX 60 METERED DOSES) 220 MCG/INH inhaler Inhale 1 puff into the lungs daily as needed (asthma flare up).  10/18/15  Yes [provider]  ondansetron (ZOFRAN) 4 MG tablet Take 1 tablet (4 mg total) by mouth every 8 (eight) hours as needed for nausea or vomiting. 08/30/20  Yes Gery Pray, MD  pantoprazole (PROTONIX) 40 MG tablet Take 40 mg by mouth 2 (two) times daily. 09/01/20  Yes [provider]  predniSONE (DELTASONE) 20 MG tablet Take 20 mg by mouth 2 (two) times daily as needed (asthma flare up).  07/28/19  Yes [provider]  sucralfate (CARAFATE) 1 GM/10ML suspension Take 1 g by mouth 3 (three) times daily. 09/02/20  Yes [provider]  Tetrahydrozoline HCl (VISINE OP) Place 1 drop into both eyes 2 (two) times daily.   Yes [provider]  triamcinolone cream (KENALOG) 0.1 % Apply 1 application topically 2 (two) times daily as needed (itching).  10/01/19  Yes [provider]   Allergies    Allergen Reactions  . Bee Venom Anaphylaxis    Wasp and yellow jackets     Vital Signs: BP (!) 142/82 (BP Location: Left Arm)   Pulse 92   Temp 99.7 F (37.6 C) (Oral)   Resp 20   Ht 5' 8.5" (1.74 m)   Wt 68 kg   SpO2 98%   BMI 22.48 kg/m  Pain Scale: 0-10   Pain Score: 0-No pain   SpO2: SpO2: 98 % O2 Device:SpO2: 98 % O2 Flow Rate: .   IO: Intake/output summary:   Intake/Output Summary (Last 24 hours) at 09/13/2020 1104 Last data filed at 09/13/2020 0430 Gross per 24 hour  Intake 1980.9 ml  Output 500 ml  Net 1480.9 ml    LBM: Last BM Date: 08/22/20 Baseline Weight: Weight: 68 kg Most recent weight: Weight: 68 kg     Palliative Assessment/Data: PPS: 20%    The above conversation was completed via telephone due to the restrictions during the COVID-19 pandemic. Thorough chart review and discussion with necessary members of the care team was completed as part of assessment. All issues were discussed and addressed but no physical exam was performed.   Thank you for this consult. Palliative medicine will continue to follow and assist as needed.   Time Total:72 minutes Greater than 50%  of this time was spent counseling and coordinating care related to the above assessment and plan.  Signed by: Mariana Kaufman, AGNP-C Palliative Medicine    Please contact Palliative Medicine Team phone at [phone] for questions and concerns.  For individual provider: See Shea Evans

## 2020-09-13 NOTE — Telephone Encounter (Signed)
Patient's wife called to cancel his 10/6 appts due to him being hospitalized since Saturday @ [facility]. I also sent a secure chat to [facility] in [place] to make her aware of this as well since he has RAD ONC appointments scheduled for today and this week.

## 2020-09-14 ENCOUNTER — Inpatient Hospital Stay: Payer: Medicare Other | Admitting: Hematology & Oncology

## 2020-09-14 ENCOUNTER — Ambulatory Visit: Payer: Medicare Other

## 2020-09-14 ENCOUNTER — Telehealth: Payer: Self-pay | Admitting: Internal Medicine

## 2020-09-14 ENCOUNTER — Inpatient Hospital Stay: Payer: Medicare Other

## 2020-09-14 ENCOUNTER — Encounter: Payer: Self-pay | Admitting: *Deleted

## 2020-09-14 DIAGNOSIS — C168 Malignant neoplasm of overlapping sites of stomach: Secondary | ICD-10-CM | POA: Diagnosis not present

## 2020-09-14 DIAGNOSIS — I639 Cerebral infarction, unspecified: Secondary | ICD-10-CM

## 2020-09-14 DIAGNOSIS — I480 Paroxysmal atrial fibrillation: Secondary | ICD-10-CM | POA: Diagnosis not present

## 2020-09-14 DIAGNOSIS — Z7189 Other specified counseling: Secondary | ICD-10-CM | POA: Diagnosis not present

## 2020-09-14 DIAGNOSIS — Z515 Encounter for palliative care: Secondary | ICD-10-CM | POA: Diagnosis not present

## 2020-09-14 LAB — CBC
HCT: 26.8 % — ABNORMAL LOW (ref 39.0–52.0)
Hemoglobin: 8.3 g/dL — ABNORMAL LOW (ref 13.0–17.0)
MCH: 28.5 pg (ref 26.0–34.0)
MCHC: 31 g/dL (ref 30.0–36.0)
MCV: 92.1 fL (ref 80.0–100.0)
Platelets: 183 10*3/uL (ref 150–400)
RBC: 2.91 MIL/uL — ABNORMAL LOW (ref 4.22–5.81)
RDW: 15.2 % (ref 11.5–15.5)
WBC: 8.3 10*3/uL (ref 4.0–10.5)
nRBC: 0 % (ref 0.0–0.2)

## 2020-09-14 LAB — BASIC METABOLIC PANEL
Anion gap: 7 (ref 5–15)
BUN: 16 mg/dL (ref 8–23)
CO2: 24 mmol/L (ref 22–32)
Calcium: 8.8 mg/dL — ABNORMAL LOW (ref 8.9–10.3)
Chloride: 112 mmol/L — ABNORMAL HIGH (ref 98–111)
Creatinine, Ser: 1.37 mg/dL — ABNORMAL HIGH (ref 0.61–1.24)
GFR calc non Af Amer: 46 mL/min — ABNORMAL LOW (ref >60)
Glucose, Bld: 120 mg/dL — ABNORMAL HIGH (ref 70–99)
Potassium: 3.6 mmol/L (ref 3.5–5.1)
Sodium: 143 mmol/L (ref 135–145)

## 2020-09-14 MED ORDER — MORPHINE SULFATE (CONCENTRATE) 10 MG/0.5ML PO SOLN: 5.0000 mg | ORAL | Status: DC | PRN

## 2020-09-14 MED ORDER — LORAZEPAM 1 MG PO TABS: 1.0000 mg | ORAL_TABLET | ORAL | Status: DC | PRN

## 2020-09-14 MED ORDER — ACETAMINOPHEN 325 MG PO TABS: 650.0000 mg | ORAL_TABLET | Freq: Four times a day (QID) | ORAL | Status: DC | PRN

## 2020-09-14 MED ORDER — GLYCOPYRROLATE 0.2 MG/ML IJ SOLN: 0.2000 mg | INTRAMUSCULAR | Status: DC | PRN

## 2020-09-14 MED ORDER — HALOPERIDOL LACTATE 2 MG/ML PO CONC
0.5000 mg | ORAL | Status: DC | PRN
  Filled 2020-09-14: qty 0.3

## 2020-09-14 MED ORDER — LORAZEPAM 2 MG/ML PO CONC: 1.0000 mg | ORAL | Status: DC | PRN

## 2020-09-14 MED ORDER — ACETAMINOPHEN 650 MG RE SUPP: 650.0000 mg | Freq: Four times a day (QID) | RECTAL | Status: DC | PRN

## 2020-09-14 MED ORDER — LORAZEPAM 2 MG/ML IJ SOLN: 1.0000 mg | INTRAMUSCULAR | Status: DC | PRN

## 2020-09-14 MED ORDER — BIOTENE DRY MOUTH MT LIQD: 15.0000 mL | OROMUCOSAL | Status: DC | PRN

## 2020-09-14 MED ORDER — POLYVINYL ALCOHOL 1.4 % OP SOLN
1.0000 [drp] | Freq: Four times a day (QID) | OPHTHALMIC | Status: DC | PRN
  Administered 2020-09-18 (×2): 1 [drp] via OPHTHALMIC
  Filled 2020-09-14: qty 15

## 2020-09-14 MED ORDER — GLYCOPYRROLATE 1 MG PO TABS
1.0000 mg | ORAL_TABLET | ORAL | Status: DC | PRN
  Filled 2020-09-14: qty 1

## 2020-09-14 MED ORDER — HALOPERIDOL 0.5 MG PO TABS: 0.5000 mg | ORAL_TABLET | ORAL | Status: DC | PRN

## 2020-09-14 MED ORDER — ONDANSETRON HCL 4 MG/2ML IJ SOLN
4.0000 mg | Freq: Three times a day (TID) | INTRAMUSCULAR | Status: DC
  Administered 2020-09-14 – 2020-09-20 (×16): 4 mg via INTRAVENOUS
  Filled 2020-09-14 (×16): qty 2

## 2020-09-14 MED ORDER — HALOPERIDOL LACTATE 5 MG/ML IJ SOLN: 0.5000 mg | INTRAMUSCULAR | Status: DC | PRN

## 2020-09-14 NOTE — Progress Notes (Signed)
Occupational Therapy Treatment Patient Details Name: Roy David MRN: [id number] DOB: 1930/07/11 Today's Date: 09/14/2020    History of present illness Pt is an 86 y/o male admitted 09/10/20 secondary to AMS. Found to have watershed infarcts in bilateral cerebral and cerebellar hemispheres and R occiptal lobe. PMH includes a fib, CKD, gastric cancer, asthma, a fib, and GI bleed.    OT comments  Pt with increased lethargy and fatigue this session. Pt max A for ADL and max A +2 for partial sit<>stand for repositioning. Max A to maintain sitting EOB - once he got to EOB he did state "Oh this feels good". Pt not as participatory during vision testing this session, and today Pt did not track at all, with a tendency to close his R eye. Wife Roy David present throughout session and very helpful and kind. OT will continue to follow acutely - remains appropriate for SNF level care, but acknowledge and appreciate Palliative involvement and potential for the patient to go hospice route (which therapy respects).    Follow Up Recommendations  SNF;Supervision/Assistance - 24 hour    Equipment Recommendations  Wheelchair (measurements OT);Wheelchair cushion (measurements OT);Hospital bed    Recommendations for Other Services  (Palliative)    Precautions / Restrictions Precautions Precautions: Fall Precaution Comments: visual deficits       Mobility Bed Mobility Overal bed mobility: Needs Assistance Bed Mobility: Supine to Sit;Sit to Supine     Supine to sit: +2 for physical assistance;Total assist;HOB elevated Sit to supine: +2 for physical assistance;Total assist   General bed mobility comments: Two person total assist to come to EOB supporting bil LEs and trunk. Attempted to have pt initiate movement to EOB, but unable.    Transfers Overall transfer level: Needs assistance Equipment used: 2 person hand held assist Transfers: Sit to/from Stand Sit to Stand: +2 physical assistance;Max  assist;From elevated surface         General transfer comment: Two person max assist to stand with bil knees and feet blocked (R leg still shot out anteriorly with atempt to stand), flexed posture and unable to get fully upright to attempt to get to [facility].      Balance Overall balance assessment: Needs assistance Sitting-balance support: Feet supported;Bilateral upper extremity supported Sitting balance-Leahy Scale: Zero Sitting balance - Comments: max assist EOB with left lateral lean in sitting (also leaning left in bed when we came in).   Postural control: Left lateral lean Standing balance support: Bilateral upper extremity supported Standing balance-Leahy Scale: Zero Standing balance comment: max two person assist to half stand EOB                           ADL either performed or assessed with clinical judgement   ADL Overall ADL's : Needs assistance/impaired     Grooming: Moderate assistance;Sitting;Wash/dry face Grooming Details (indicate cue type and reason): difficult with visual deficits to locate grooming items                 Toilet Transfer: Moderate assistance;+2 for physical assistance;+2 for safety/equipment;Stand-pivot;BSC;Cueing for safety;Cueing for sequencing Toilet Transfer Details (indicate cue type and reason): watch LLE, started to slide even with blocking                 Vision   Additional Comments: today Pt was unable to track at all, he did not follow directions to look up or down or to the side.    Perception  Praxis      Cognition Arousal/Alertness: Lethargic;Awake/alert Behavior During Therapy: WFL for tasks assessed/performed Overall Cognitive Status: Impaired/Different from baseline Area of Impairment: Attention;Following commands;Awareness;Problem solving                   Current Attention Level: Sustained (brief, with structure and redirection) Memory: Decreased short-term memory Following Commands:  Follows one step commands with increased time;Follows one step commands inconsistently Safety/Judgement: Decreased awareness of safety;Decreased awareness of deficits Awareness: Intellectual Problem Solving: Slow processing;Difficulty sequencing;Requires verbal cues;Requires tactile cues;Decreased initiation General Comments: Pt slow to process, at times not initiating movement when asked and given extra time.  Does best with verbal and tactile cueing due to visual deficits.          Exercises     Shoulder Instructions       General Comments Pt wife Roy David present during session, and very sweet    Pertinent Vitals/ Pain       Pain Assessment: No/denies pain Faces Pain Scale: No hurt  Home Living                                          Prior Functioning/Environment              Frequency  Min 2X/week        Progress Toward Goals  OT Goals(current goals can now be found in the care plan section)  Progress towards OT goals: Not progressing toward goals - comment (more lethargic today)  Acute Rehab OT Goals Patient Stated Goal: none stated  Plan Discharge plan remains appropriate;Frequency remains appropriate;Other (comment) (Will respect family decision if they choose Hospice)    Co-evaluation    PT/OT/SLP Co-Evaluation/Treatment: Yes Reason for Co-Treatment: Complexity of the patient's impairments (multi-system involvement);Necessary to address cognition/behavior during functional activity;To address functional/ADL transfers;For patient/therapist safety;Other (comment) (Pt likely unable to tolerate 2 sessions) PT goals addressed during session: Mobility/safety with mobility;Balance;Strengthening/ROM OT goals addressed during session: ADL's and self-care;Strengthening/ROM      AM-PAC OT "6 Clicks" Daily Activity     Outcome Measure   Help from another person eating meals?: A Lot Help from another person taking care of personal grooming?: A  Lot Help from another person toileting, which includes using toliet, bedpan, or urinal?: A Lot Help from another person bathing (including washing, rinsing, drying)?: A Lot Help from another person to put on and taking off regular upper body clothing?: A Lot Help from another person to put on and taking off regular lower body clothing?: A Lot 6 Click Score: 12    End of Session Equipment Utilized During Treatment: Gait belt  OT Visit Diagnosis: Unsteadiness on feet (R26.81);Other abnormalities of gait and mobility (R26.89);Muscle weakness (generalized) (M62.81);Other symptoms and signs involving cognitive function;Hemiplegia and hemiparesis;Low vision, both eyes (H54.2) Hemiplegia - Right/Left: Right Hemiplegia - dominant/non-dominant: Dominant Hemiplegia - caused by: Cerebral infarction   Activity Tolerance Patient tolerated treatment well   Patient Left in bed;with call bell/phone within reach;with family/visitor present   Nurse Communication Mobility status        Time: 3662-9476 OT Time Calculation (min): 26 min  Charges: OT General Charges $OT Visit: 1 Visit OT Treatments $Self Care/Home Management : 8-22 mins  Jesse Sans OTR/L Acute Rehabilitation Services Pager: [phone] Office: [place] 09/14/2020, 2:09 PM

## 2020-09-14 NOTE — Progress Notes (Signed)
Oncology Nurse Navigator Documentation  Oncology Nurse Navigator Flowsheets 09/14/2020  Abnormal Finding Date -  Confirmed Diagnosis Date -  Diagnosis Status -  Planned Course of Treatment -  Phase of Treatment -  Radiation Actual Start Date: -  Radiation Expected End Date: -  Navigator Follow Up Date: -  Navigator Follow Up Reason: -  Navigation Complete Date: 09/14/2020  Post Navigation: Continue to Follow Patient? No  Reason Not Navigating Patient: Hospice/Death  Navigator Location [facility]  Referral Date to RadOnc/MedOnc -  Navigator Encounter Type Appt/Treatment Plan Review  Treatment Initiated Date -  Patient Visit Type -  Treatment Phase -  Barriers/Navigation Needs -  Education -  Interventions -  Acuity -  Education Method -  Support Groups/Services -  Time Spent with Patient 15

## 2020-09-14 NOTE — Progress Notes (Signed)
Manufacturing engineer [facility])  Referral received for [facility] for EOL care.  Left message with family.  Per PMT note, family may want to speak with Dr. Marin Olp one last time to get his input before deciding.   [place] does not have a bed today for Mr. Beem.  Thank you, Venia Carbon RN, BSN, [facility] Liaison

## 2020-09-14 NOTE — Progress Notes (Signed)
PROGRESS NOTE    Roy David  FFM:[id number] DOB: 14-Jun-1930 DOA: 09/10/2020 PCP: Roy David, Roy Sa, MD (Confirm with patient/family/NH records and if not entered, this HAS to be entered at [facility] point of entry. "No PCP" if truly none.)   Brief Narrative: (Start on day 1 of progress note - keep it brief and live)  Patient is a 86 year old Caucasian male with history of hypertension, hyperlipidemia, paroxysmal atrial fibrillation, recently diagnosed gastric cancer undergoing radiation treatment, hypothyroidism, anemia and ED stage III presented to ED for evaluation of altered mental status. In ED patient was found to have multiple acute versus subacute strokes.MRI brain revealed bilateral extensive watershed infarcts in the cerebral and cerebellar hemisphere with generalized atrophy and chronic small vessel ischemic changes. CT angiogram head and neck was negative for large vessel occlusion or stenosis.  Neurology started patient on aspirin and statin.Echocardiogram showed grade 2 severe aortic stenosis but cardiology  recommended outpatient follow-up and does not think that patient is a candidate for TAVR secondary to ongoing GI bleed and prognosis. Patient's  condition continue to worsen and today I had a long discussion with patient's wife and she decided to make patient DNR.  Patient's wife also states that she is waiting for the nurse practitioner from palliative care today and want to know all the options.  Patient's wife clearly mentioned that she does not want any active management at this time for his medical problems. Assessment & Plan:   Principal Problem:   Acute CVA (cerebrovascular accident) [facility]) Active Problems:   Essential hypertension   Hypercholesterolemia   Paroxysmal atrial fibrillation (HCC)   Anemia   Gastric cancer (HCC)   CVA (cerebral vascular accident) (Roy David)   Protein-calorie malnutrition, severe   Advanced care planning/counseling discussion   Palliative care by  specialist   Acute CVA Gastric cancer undergoing radiation treatment Paroxysmal atrial fibrillation not on anticoagulation Hypokalemia Dehydration most likely secondary to nausea and poor oral intake. Anemia with recent GI bleeding Hypertension Hyperlipidemia Hypothyroidism Asthma CKD stage III Aortic stenosis: Palliative care consult :  PLAN Wife decided to make him comfort care only. All the other medications are discontinued except the pain medications, Haldol for agitation, Robinul for secretions, Ativan for anxiety and agitation and albuterol inhaler for breathing difficulty.   DVT prophylaxis: None Code Status: DNR Family Communication: Patient's wife present at the bedside Disposition Plan: Patient will most probably discharge to hospice care center   Consultants:   Neurology  Procedures: (Don't include imaging studies which can be auto populated. Include things that cannot be auto populated i.e. Echo, Carotid and venous dopplers, Foley, Bipap, HD, tubes/drains, wound vac, central lines etc) Echocardiogram  Antimicrobials: (specify start and planned stop date. Auto populated tables are space occupying and do not give end dates)     Subjective: Patient seen and evaluated at the bedside today. Patient looks very weak and tired and did not want to communicate. Patient's wife is present at the bedside. I had a long discussion with patient's wife about his poor prognosis and she decided to make her DNR at that time and later on she decided to go for comfort care only.  Objective: Vitals:   09/14/20 0420 09/14/20 0720 09/14/20 1140 09/14/20 1537  BP: 121/74 119/78 109/68 116/86  Pulse: 84 82 89 78  Resp: 18   18  Temp: 98.2 F (36.8 C) 98.8 F (37.1 C) 99.2 F (37.3 C) 98.7 F (37.1 C)  TempSrc: Oral Axillary Oral Oral  SpO2: 98% 98% 97%  98%  Weight:      Height:        Intake/Output Summary (Last 24 hours) at 09/14/2020 1711 Last data filed at 09/14/2020  1004 Gross per 24 hour  Intake 40 ml  Output 450 ml  Net -410 ml   Filed Weights   09/10/20 1935  Weight: 68 kg    Examination:  General exam: Appears cachectic and sick but not in acute distress. Respiratory system: Clear to auscultation. Respiratory effort normal. Cardiovascular system: S1 & S2 heard, RRR. No JVD, murmurs, rubs, gallops or clicks. No pedal edema. Gastrointestinal system: Abdomen is nondistended, soft and nontender. No organomegaly or masses felt. Normal bowel sounds heard. Central nervous system: Alert and oriented. No focal neurological deficits. Extremities: Symmetric 5 x 5 power. Skin: No rashes, lesions or ulcers Psychiatry: Patient did not want to communicate. Patient did not show any signs of anxiety and agitation.    Data Reviewed: I have personally reviewed following labs and imaging studies  CBC: Recent Labs  Lab 09/10/20 2205 09/12/20 1144 09/13/20 0153 09/14/20 0347  WBC 6.1 5.4 7.5 8.3  HGB 9.1* 8.7* 8.9* 8.3*  HCT 29.3* 27.9* 28.4* 26.8*  MCV 91.6 91.5 92.8 92.1  PLT 263 218 238 979   Basic Metabolic Panel: Recent Labs  Lab 09/10/20 2205 09/13/20 0153 09/14/20 0347  NA 138 143 143  K 4.1 3.1* 3.6  CL 105 108 112*  CO2 22 25 24   GLUCOSE 100* 129* 120*  BUN 20 15 16   CREATININE 1.51* 1.41* 1.37*  CALCIUM 9.2 8.9 8.8*  MG  --  1.7  --    GFR: Estimated Creatinine Clearance: 35.8 mL/min (A) (by C-G formula based on SCr of 1.37 mg/dL (H)). Liver Function Tests: Recent Labs  Lab 09/10/20 2205  AST 39  ALT 13  ALKPHOS 57  BILITOT 1.1  PROT 5.8*  ALBUMIN 2.8*   No results for input(s): LIPASE, AMYLASE in the last 168 hours. No results for input(s): AMMONIA in the last 168 hours. Coagulation Profile: Recent Labs  Lab 09/10/20 2205  INR 1.1   Cardiac Enzymes: No results for input(s): CKTOTAL, CKMB, CKMBINDEX, TROPONINI in the last 168 hours. BNP (last 3 results) No results for input(s): PROBNP in the last 8760  hours. HbA1C: No results for input(s): HGBA1C in the last 72 hours. CBG: Recent Labs  Lab 09/10/20 2212  GLUCAP 97   Lipid Profile: No results for input(s): CHOL, HDL, LDLCALC, TRIG, CHOLHDL, LDLDIRECT in the last 72 hours. Thyroid Function Tests: No results for input(s): TSH, T4TOTAL, FREET4, T3FREE, THYROIDAB in the last 72 hours. Anemia Panel: No results for input(s): VITAMINB12, FOLATE, FERRITIN, TIBC, IRON, RETICCTPCT in the last 72 hours. Sepsis Labs: No results for input(s): PROCALCITON, LATICACIDVEN in the last 168 hours.  Recent Results (from the past 240 hour(s))  Respiratory Panel by RT PCR (Flu A&B, Covid) - Nasopharyngeal Swab     Status: None   Collection Time: 09/10/20 11:51 PM   Specimen: Nasopharyngeal Swab  Result Value Ref Range Status   SARS Coronavirus 2 by RT PCR NEGATIVE NEGATIVE Final    Comment: (NOTE) SARS-CoV-2 target nucleic acids are NOT DETECTED.  The SARS-CoV-2 RNA is generally detectable in upper respiratoy specimens during the acute phase of infection. The lowest concentration of SARS-CoV-2 viral copies this assay can detect is 131 copies/mL. A negative result does not preclude SARS-Cov-2 infection and should not be used as the sole basis for treatment or other patient management decisions. A  negative result may occur with  improper specimen collection/handling, submission of specimen other than nasopharyngeal swab, presence of viral mutation(s) within the areas targeted by this assay, and inadequate number of viral copies (<131 copies/mL). A negative result must be combined with clinical observations, patient history, and epidemiological information. The expected result is Negative.  Fact Sheet for Patients:  PinkCheek.be  Fact Sheet for Healthcare Providers:  GravelBags.it  This test is no t yet approved or cleared by the [place] FDA and  has been authorized for detection  and/or diagnosis of SARS-CoV-2 by FDA under an Emergency Use Authorization (EUA). This EUA will remain  in effect (meaning this test can be used) for the duration of the COVID-19 declaration under Section 564(b)(1) of the Act, 21 U.S.C. section 360bbb-3(b)(1), unless the authorization is terminated or revoked sooner.     Influenza A by PCR NEGATIVE NEGATIVE Final   Influenza B by PCR NEGATIVE NEGATIVE Final    Comment: (NOTE) The Xpert Xpress SARS-CoV-2/FLU/RSV assay is intended as an aid in  the diagnosis of influenza from Nasopharyngeal swab specimens and  should not be used as a sole basis for treatment. Nasal washings and  aspirates are unacceptable for Xpert Xpress SARS-CoV-2/FLU/RSV  testing.  Fact Sheet for Patients: PinkCheek.be  Fact Sheet for Healthcare Providers: GravelBags.it  This test is not yet approved or cleared by the [place] FDA and  has been authorized for detection and/or diagnosis of SARS-CoV-2 by  FDA under an Emergency Use Authorization (EUA). This EUA will remain  in effect (meaning this test can be used) for the duration of the  Covid-19 declaration under Section 564(b)(1) of the Act, 21  U.S.C. section 360bbb-3(b)(1), unless the authorization is  terminated or revoked. Performed at [facility], [place] [address] [place], [place] [address]          Radiology Studies: No results found.      Scheduled Meds: . feeding supplement (ENSURE ENLIVE)  237 mL Oral TID BM  . gabapentin  300 mg Oral Daily  . ondansetron (ZOFRAN) IV  4 mg Intravenous Q8H  . pantoprazole  40 mg Oral BID  . sucralfate  1 g Oral TID  . tetrahydrozoline  1 drop Both Eyes BID   Continuous Infusions: . dextrose 5% lactated ringers 75 mL/hr at 09/14/20 0935     LOS: 3 days    Time spent:     Edmonia Lynch, MD [facility] Pager [phone]  If 7PM-7AM, please contact  night-coverage [email or web address] Password [email or web address] 09/14/2020, 5:11 PM

## 2020-09-14 NOTE — TOC Progression Note (Signed)
Transition of Care [facility]) - Progression Note    Patient Details  Name: Roy David MRN: [id number] Date of Birth: 1930-11-19  Transition of Care [facility]) CM/SW [place], [place] Phone Number: 09/14/2020, 3:34 PM  Clinical Narrative:   CSW notified by PMT NP that patient's family has decided to pursue hospice placement, preference for [facility]. CSW notified [facility] liaison Anderson Malta that they could reach out to family to discuss. No bed available today, will follow for bed availability tomorrow.    Expected Discharge Plan: [place] Barriers to Discharge: Hospice Bed not available  Expected Discharge Plan and Services Expected Discharge Plan: [place]       Living arrangements for the past 2 months: Single Family Home                                       Social Determinants of Health (SDOH) Interventions    Readmission Risk Interventions No flowsheet data found.

## 2020-09-14 NOTE — Progress Notes (Addendum)
Daily Progress Note   Patient Name: Roy David       Date: 09/14/2020 DOB: March 04, 1930  Age: 86 y.o. MRN#: [id number] Attending Physician: Edmonia Lynch, DO Primary Care Physician: Aurea Graff.Marlou Sa, MD Admit Date: 09/10/2020  Reason for Consultation/Follow-up: Establishing goals of care  Subjective: Per report patient more lethargic. Not eating.  Note from Dr. Marin Olp indicates he believes patient is "declaring himself" and is at end of life- recommends Hospice.  Noted patient made DNR yesterday per primary team.  Spoke with spouse via phone.  She is interested in [facility] and I believe patient is eligible due to gastric cancer, recent CVA with deficits, no po intake.  I discussed the philosophy of care at [facility] and what comfort measures entails.  [name] would like to discuss this option with her son and daughter and make final decision tomorrow.  She is also interested in speaking with Dr. Marin Olp.    Length of Stay: 3  Current Medications: Scheduled Meds:  . aspirin EC  81 mg Oral Daily  . ezetimibe  10 mg Oral Daily  . feeding supplement (ENSURE ENLIVE)  237 mL Oral TID BM  . gabapentin  300 mg Oral Daily  . levothyroxine  100 mcg Oral Q0600  . megestrol  400 mg Oral BID  . multivitamin with minerals  1 tablet Oral Daily  . pantoprazole  40 mg Oral BID  . simvastatin  20 mg Oral q1800  . sucralfate  1 g Oral TID  . tetrahydrozoline  1 drop Both Eyes BID    Continuous Infusions: . dextrose 5% lactated ringers 75 mL/hr at 09/14/20 0935    PRN Meds: acetaminophen **OR** acetaminophen (TYLENOL) oral liquid 160 mg/5 mL **OR** acetaminophen, albuterol, senna-docusate         Vital Signs: BP 109/68 (BP Location: Left Arm)   Pulse 89   Temp 99.2 F (37.3 C)  (Oral)   Resp 18   Ht 5' 8.5" (1.74 m)   Wt 68 kg   SpO2 97%   BMI 22.48 kg/m  SpO2: SpO2: 97 % O2 Device: O2 Device: Room Air O2 Flow Rate:    Intake/output summary:   Intake/Output Summary (Last 24 hours) at 09/14/2020 1434 Last data filed at 09/14/2020 1004 Gross per 24 hour  Intake 40 ml  Output  450 ml  Net -410 ml   LBM: Last BM Date: 08/22/20 Baseline Weight: Weight: 68 kg Most recent weight: Weight: 68 kg       Palliative Assessment/Data: PPS: 10%     Patient Active Problem List   Diagnosis Date Noted  . Advanced care planning/counseling discussion   . Palliative care by specialist   . Protein-calorie malnutrition, severe 09/12/2020  . CVA (cerebral vascular accident) ([place]) 09/11/2020  . Acute CVA (cerebrovascular accident) ([place]) 09/10/2020  . Gastric cancer ([place]) 08/25/2020  . Goals of care, counseling/discussion 08/25/2020  . Iron deficiency anemia due to chronic blood loss 01/18/2019  . Status post placement of implantable loop recorder 03/05/2018  . Anemia   . Hypochromic-microcytic anemia   . Symptomatic anemia 01/15/2018  . Paroxysmal atrial fibrillation ([place]) 03/06/2016  . Central retinal artery occlusion of right eye 11/23/2015  . Aortic valve stenosis, nonrheumatic 11/23/2015  . Essential hypertension 11/23/2015  . Hypercholesterolemia 11/23/2015    Palliative Care Assessment & Plan   Patient Profile: 86 y.o. male  with past medical history of asthma, aortic stenosis, TIA, retinal vein thrombus (on anticoag), locally advanced gastric cancer with ongoing significant GI bleeding- currently on radiation therapy with plans to start immunotherapy after,  admitted on 09/10/2020 with altered mental status (lethargy, speech difficulties, L hemianopsia, RUE weakness)-  Workup reveals extensive watershed infarcts, L PCA infarct (subacute, likely due to hypoperfusion). Palliative consulted per patient's spouse's request.    Assessment/Recommendations/Plan   Patient appears to be dying- not able to eat or drink, per Oncology- not candidate for further treatments of gastric cancer  Recommend transition to full comfort measures and seek residential Hospice placement- discussed with spouse- she is interested in [facility] but would like to discuss with her son and daughter first and she is also interested in hearing recommendations from Dr. Marin Olp- ( I reviewed his note with her- but understandably she is very close to him and would take his recommendations to heart)  Given gastric cancer and ongoing nausea/vomiting- will start scheduled antiemetic- also recommend prn lorazepam for agitation, anxiety as this also has antiemetic effect  1530 addendum- received call back from [facility]- she would like to proceed with requesting bed at [facility]- I have made TOC referral and d/c'd some of patient's maintenance medications and entered comfort care orders  Code Status:  DNR  Prognosis:   < 2 weeks  Discharge Planning:  To Be Determined- likely to request [facility] plan was discussed with patient and care team.   Thank you for allowing the Palliative Medicine Team to assist in the care of this patient.  The above conversation was completed via telephone due to the restrictions during the COVID-19 pandemic. Thorough chart review and discussion with necessary members of the care team was completed as part of assessment. All issues were discussed and addressed but no physical exam was performed.       Total Time 42 minutes Prolonged Time Billed no      Greater than 50%  of this time was spent counseling and coordinating care related to the above assessment and plan.  Mariana Kaufman, AGNP-C Palliative Medicine   Please contact Palliative Medicine Team phone at [phone] for questions and concerns.

## 2020-09-14 NOTE — Telephone Encounter (Signed)
See attached note.  Ellon Marasco, NP-C 

## 2020-09-14 NOTE — Progress Notes (Signed)
Physical Therapy Treatment Patient Details Name: Roy David MRN: [id number] DOB: 07/21/1930 Today's Date: 09/14/2020    History of Present Illness Pt is an 86 y/o male admitted 09/10/20 secondary to AMS. Found to have watershed infarcts in bilateral cerebral and cerebellar hemispheres and R occiptal lobe. PMH includes a fib, CKD, gastric cancer, asthma, a fib, and GI bleed.     PT Comments    Wife in room throughout PT/OT co session. Pt remains max to total assist for mobility, attempted partial stand to get higher in the bed also with max assist.  Pt recognizes wife, but remains significantly cognitively impaired.  He remains appropriate for SNF level rehab at discharge.  Try sara stedy next session.  PT will continue to follow acutely for safe mobility progression.   Follow Up Recommendations  SNF     Equipment Recommendations  Wheelchair (measurements PT);Wheelchair cushion (measurements PT);Hospital bed;3in1 (PT)    Recommendations for Other Services       Precautions / Restrictions Precautions Precautions: Fall Precaution Comments: visual deficits    Mobility  Bed Mobility Overal bed mobility: Needs Assistance Bed Mobility: Supine to Sit;Sit to Supine     Supine to sit: +2 for physical assistance;Total assist;HOB elevated Sit to supine: +2 for physical assistance;Total assist   General bed mobility comments: Two person total assist to come to EOB supporting bil LEs and trunk. Attempted to have pt initiate movement to EOB, but unable.    Transfers Overall transfer level: Needs assistance Equipment used: 2 person hand held assist Transfers: Sit to/from Stand Sit to Stand: +2 physical assistance;Max assist;From elevated surface         General transfer comment: Two person max assist to stand with bil knees and feet blocked (R leg still shot out anteriorly with atempt to stand), flexed posture and unable to get fully upright to attempt to get to [facility].     Ambulation/Gait             General Gait Details: unable at this time and pt preferred back to bed to rest to recliner chair.    Stairs             Wheelchair Mobility    Modified Rankin (Stroke Patients Only) Modified Rankin (Stroke Patients Only) Pre-Morbid Rankin Score: Moderate disability Modified Rankin: Severe disability     Balance Overall balance assessment: Needs assistance Sitting-balance support: Feet supported;Bilateral upper extremity supported Sitting balance-Leahy Scale: Zero Sitting balance - Comments: max assist EOB with left lateral lean in sitting (also leaning left in bed when we came in).   Postural control: Left lateral lean Standing balance support: Bilateral upper extremity supported Standing balance-Leahy Scale: Zero Standing balance comment: max two person assist to half stand EOB                            Cognition Arousal/Alertness: Lethargic;Awake/alert Behavior During Therapy: WFL for tasks assessed/performed Overall Cognitive Status: Impaired/Different from baseline Area of Impairment: Attention;Following commands;Awareness;Problem solving                   Current Attention Level: Sustained (brief, with structure and redirection) Memory: Decreased short-term memory Following Commands: Follows one step commands with increased time;Follows one step commands inconsistently Safety/Judgement: Decreased awareness of safety;Decreased awareness of deficits Awareness: Intellectual Problem Solving: Slow processing;Difficulty sequencing;Requires verbal cues;Requires tactile cues;Decreased initiation General Comments: Pt slow to process, at times not initiating movement when asked and given extra  time.  Does best with verbal and tactile cueing due to visual deficits.        Exercises      General Comments        Pertinent Vitals/Pain Pain Assessment: No/denies pain Faces Pain Scale: No hurt    Home Living                       Prior Function            PT Goals (current goals can now be found in the care plan section) Acute Rehab PT Goals Patient Stated Goal: none stated Progress towards PT goals: Progressing toward goals    Frequency    Min 3X/week      PT Plan Current plan remains appropriate    Co-evaluation PT/OT/SLP Co-Evaluation/Treatment: Yes Reason for Co-Treatment: Complexity of the patient's impairments (multi-system involvement);Necessary to address cognition/behavior during functional activity;To address functional/ADL transfers;For patient/therapist safety;Other (comment) (pt likely would not tolerate both in one day) PT goals addressed during session: Mobility/safety with mobility;Balance;Strengthening/ROM        AM-PAC PT "6 Clicks" Mobility   Outcome Measure  Help needed turning from your back to your side while in a flat bed without using bedrails?: Total Help needed moving from lying on your back to sitting on the side of a flat bed without using bedrails?: Total Help needed moving to and from a bed to a chair (including a wheelchair)?: Total Help needed standing up from a chair using your arms (e.g., wheelchair or bedside chair)?: Total Help needed to walk in hospital room?: Total Help needed climbing 3-5 steps with a railing? : Total 6 Click Score: 6    End of Session Equipment Utilized During Treatment: Gait belt Activity Tolerance: Patient limited by fatigue Patient left: in bed;with call bell/phone within reach;with bed alarm set;with family/visitor present (wife [name] was in room)   PT Visit Diagnosis: Muscle weakness (generalized) (M62.81);Unsteadiness on feet (R26.81);Difficulty in walking, not elsewhere classified (R26.2);History of falling (Z91.81);Other symptoms and signs involving the nervous system (Z61.096)     Time: 0454-0981 PT Time Calculation (min) (ACUTE ONLY): 23 min  Charges:  $Therapeutic Activity: 8-22 mins                      Verdene Lennert, PT, DPT  Acute Rehabilitation [phone] pager [phone]) [phone] office

## 2020-09-15 ENCOUNTER — Ambulatory Visit: Payer: Medicare Other

## 2020-09-15 DIAGNOSIS — I480 Paroxysmal atrial fibrillation: Secondary | ICD-10-CM | POA: Diagnosis not present

## 2020-09-15 NOTE — Progress Notes (Signed)
PROGRESS NOTE    Roy David  TZG:[id number] DOB: 1930/10/17 DOA: 09/10/2020 PCP: [name], L.Marlou Sa, MD (Confirm with patient/family/NH records and if not entered, this HAS to be entered at [facility] point of entry. "No PCP" if truly none.)   Brief Narrative: (Start on day 1 of progress note - keep it brief and live) Patient is a 86 year old Caucasian male with history of hypertension, hyperlipidemia, paroxysmal atrial fibrillation, recently diagnosed gastric cancer undergoing radiation treatment, hypothyroidism, anemia and ED stage III presented to ED for evaluation of altered mental status. In ED patient was found to have multiple acute versus subacute strokes.MRI brain revealed bilateral extensive watershed infarcts in the cerebral and cerebellar hemisphere with generalized atrophy and chronic small vessel ischemic changes. CT angiogram head and neck was negative for large vessel occlusion or stenosis.Neurology started patient on aspirin and statin.Echocardiogram showed grade 2 severe aortic stenosisbut cardiology recommended outpatient follow-up and does not think that patient is a candidate for TAVR secondary to ongoing GI bleed and prognosis.Patient's condition continue to worsen and yesterday I had a long discussion with patient's wife and she decided to make patient DNR.  Patient's wife also states that she is waiting for the nurse practitioner from palliative care and decided to make him comfort care only. Assessment & Plan:   Principal Problem:   Acute CVA (cerebrovascular accident) [facility]) Active Problems:   Essential hypertension   Hypercholesterolemia   Paroxysmal atrial fibrillation (HCC)   Anemia   Gastric cancer (HCC)   CVA (cerebral vascular accident) ([place])   Protein-calorie malnutrition, severe   Advanced care planning/counseling discussion   Palliative care by specialist   Acute CVA Gastric cancer undergoing radiation treatment Paroxysmal atrial fibrillation not on  anticoagulation Hypokalemia Dehydration most likely secondary to nausea and poor oral intake. Anemia with recent GI bleeding Hypertension Hyperlipidemia Hypothyroidism Asthma CKD stage III Aortic stenosis: Palliative care consult:  PLAN Wife decided to make him comfort care only. All the other medications are discontinued except the pain medications, Haldol for agitation, Robinul for secretions, Ativan for anxiety and agitation and albuterol inhaler for breathing difficulty.    DVT prophylaxis: None Code Status: DNR/comfort care Family Communication: Patient's wife and daughter present at the bedside Disposition Plan: Palliative care center.   Consultants:   Neurology  Procedures: (Don't include imaging studies which can be auto populated. Include things that cannot be auto populated i.e. Echo, Carotid and venous dopplers, Foley, Bipap, HD, tubes/drains, wound vac, central lines etc)  Echocardiogram  Antimicrobials: (specify start and planned stop date. Auto populated tables are space occupying and do not give end dates)     Subjective: Patient is seen and evaluated at the bedside today.  Patient's wife and daughter present at the bedside.  Patient looks weak and sick but was able to communicate today.  Patient complaining of generalized weakness but denies any other complaints.  Objective: Vitals:   09/15/20 0336 09/15/20 0754 09/15/20 1113 09/15/20 1611  BP: 129/73 128/81 115/71 122/71  Pulse: 74 78 80 76  Resp: 18 20 18 18   Temp: 98 F (36.7 C) 98.7 F (37.1 C) 98.7 F (37.1 C) 98.5 F (36.9 C)  TempSrc:  Axillary Oral Axillary  SpO2: 99% 97% 98% 98%  Weight:      Height:        Intake/Output Summary (Last 24 hours) at 09/15/2020 1617 Last data filed at 09/15/2020 0916 Gross per 24 hour  Intake 390 ml  Output 400 ml  Net -10 ml  Filed Weights   09/10/20 1935  Weight: 68 kg    Examination:  General exam: 86 year old cachectic Caucasian male in  no acute distress. Respiratory system: Clear to auscultation. Respiratory effort normal. Cardiovascular system: S1 & S2 heard, RRR. No JVD, murmurs, rubs, gallops or clicks. No pedal edema. Gastrointestinal system: Abdomen is nondistended, soft and nontender. No organomegaly or masses felt. Normal bowel sounds heard. Central nervous system: Alert and oriented. No focal neurological deficits. Extremities: Symmetric  Skin: No rashes, lesions or ulcers Psychiatry: Patient has a much better mood today as compared to yesterday.    Data Reviewed: I have personally reviewed following labs and imaging studies  CBC: Recent Labs  Lab 09/10/20 2205 09/12/20 1144 09/13/20 0153 09/14/20 0347  WBC 6.1 5.4 7.5 8.3  HGB 9.1* 8.7* 8.9* 8.3*  HCT 29.3* 27.9* 28.4* 26.8*  MCV 91.6 91.5 92.8 92.1  PLT 263 218 238 270   Basic Metabolic Panel: Recent Labs  Lab 09/10/20 2205 09/13/20 0153 09/14/20 0347  NA 138 143 143  K 4.1 3.1* 3.6  CL 105 108 112*  CO2 22 25 24   GLUCOSE 100* 129* 120*  BUN 20 15 16   CREATININE 1.51* 1.41* 1.37*  CALCIUM 9.2 8.9 8.8*  MG  --  1.7  --    GFR: Estimated Creatinine Clearance: 35.8 mL/min (A) (by C-G formula based on SCr of 1.37 mg/dL (H)). Liver Function Tests: Recent Labs  Lab 09/10/20 2205  AST 39  ALT 13  ALKPHOS 57  BILITOT 1.1  PROT 5.8*  ALBUMIN 2.8*   No results for input(s): LIPASE, AMYLASE in the last 168 hours. No results for input(s): AMMONIA in the last 168 hours. Coagulation Profile: Recent Labs  Lab 09/10/20 2205  INR 1.1   Cardiac Enzymes: No results for input(s): CKTOTAL, CKMB, CKMBINDEX, TROPONINI in the last 168 hours. BNP (last 3 results) No results for input(s): PROBNP in the last 8760 hours. HbA1C: No results for input(s): HGBA1C in the last 72 hours. CBG: Recent Labs  Lab 09/10/20 2212  GLUCAP 97   Lipid Profile: No results for input(s): CHOL, HDL, LDLCALC, TRIG, CHOLHDL, LDLDIRECT in the last 72  hours. Thyroid Function Tests: No results for input(s): TSH, T4TOTAL, FREET4, T3FREE, THYROIDAB in the last 72 hours. Anemia Panel: No results for input(s): VITAMINB12, FOLATE, FERRITIN, TIBC, IRON, RETICCTPCT in the last 72 hours. Sepsis Labs: No results for input(s): PROCALCITON, LATICACIDVEN in the last 168 hours.  Recent Results (from the past 240 hour(s))  Respiratory Panel by RT PCR (Flu A&B, Covid) - Nasopharyngeal Swab     Status: None   Collection Time: 09/10/20 11:51 PM   Specimen: Nasopharyngeal Swab  Result Value Ref Range Status   SARS Coronavirus 2 by RT PCR NEGATIVE NEGATIVE Final    Comment: (NOTE) SARS-CoV-2 target nucleic acids are NOT DETECTED.  The SARS-CoV-2 RNA is generally detectable in upper respiratoy specimens during the acute phase of infection. The lowest concentration of SARS-CoV-2 viral copies this assay can detect is 131 copies/mL. A negative result does not preclude SARS-Cov-2 infection and should not be used as the sole basis for treatment or other patient management decisions. A negative result may occur with  improper specimen collection/handling, submission of specimen other than nasopharyngeal swab, presence of viral mutation(s) within the areas targeted by this assay, and inadequate number of viral copies (<131 copies/mL). A negative result must be combined with clinical observations, patient history, and epidemiological information. The expected result is Negative.  Fact Sheet  for Patients:  PinkCheek.be  Fact Sheet for Healthcare Providers:  GravelBags.it  This test is no t yet approved or cleared by the [place] FDA and  has been authorized for detection and/or diagnosis of SARS-CoV-2 by FDA under an Emergency Use Authorization (EUA). This EUA will remain  in effect (meaning this test can be used) for the duration of the COVID-19 declaration under Section 564(b)(1) of the  Act, 21 U.S.C. section 360bbb-3(b)(1), unless the authorization is terminated or revoked sooner.     Influenza A by PCR NEGATIVE NEGATIVE Final   Influenza B by PCR NEGATIVE NEGATIVE Final    Comment: (NOTE) The Xpert Xpress SARS-CoV-2/FLU/RSV assay is intended as an aid in  the diagnosis of influenza from Nasopharyngeal swab specimens and  should not be used as a sole basis for treatment. Nasal washings and  aspirates are unacceptable for Xpert Xpress SARS-CoV-2/FLU/RSV  testing.  Fact Sheet for Patients: PinkCheek.be  Fact Sheet for Healthcare Providers: GravelBags.it  This test is not yet approved or cleared by the [place] FDA and  has been authorized for detection and/or diagnosis of SARS-CoV-2 by  FDA under an Emergency Use Authorization (EUA). This EUA will remain  in effect (meaning this test can be used) for the duration of the  Covid-19 declaration under Section 564(b)(1) of the Act, 21  U.S.C. section 360bbb-3(b)(1), unless the authorization is  terminated or revoked. Performed at [facility], [place] [address], [place], [place] [address]          Radiology Studies: No results found.      Scheduled Meds: . feeding supplement (ENSURE ENLIVE)  237 mL Oral TID BM  . gabapentin  300 mg Oral Daily  . ondansetron (ZOFRAN) IV  4 mg Intravenous Q8H  . pantoprazole  40 mg Oral BID  . sucralfate  1 g Oral TID  . tetrahydrozoline  1 drop Both Eyes BID   Continuous Infusions:   LOS: 4 days    Time spent:     Edmonia Lynch, MD [facility] Pager [phone]  If 7PM-7AM, please contact night-coverage [email or web address] Password  09/15/2020, 4:17 PM

## 2020-09-15 NOTE — Care Management Important Message (Signed)
Important Message  Patient Details  Name: Roy David MRN: [id number] Date of Birth: [birth year]   Medicare Important Message Given:  Yes  Signed IM mailed to the patient home address.     Shada Nienaber 09/15/2020, 12:43 PM

## 2020-09-15 NOTE — Progress Notes (Signed)
Nutrition Brief Note  Chart reviewed. Pt now transitioning to comfort care.  No further nutrition interventions warranted at this time.  Please re-consult as needed.   Almond Fitzgibbon, MS, RD, LDN RD pager number and weekend/on-call pager number located in Amion.    

## 2020-09-15 NOTE — TOC Progression Note (Signed)
Transition of Care [facility]) - Progression Note    Patient Details  Name: Roy David MRN: [id number] Date of Birth: 1930/10/21  Transition of Care [facility]) CM/SW Contact  Pollie Friar, RN Phone Number: 09/15/2020, 11:59 AM  Clinical Narrative:    No bed availability at [facility] today. CM has updated the patients family.   Expected Discharge Plan: [place] Barriers to Discharge: Hospice Bed not available  Expected Discharge Plan and Services Expected Discharge Plan: [place]       Living arrangements for the past 2 months: Single Family Home                                       Social Determinants of Health (SDOH) Interventions    Readmission Risk Interventions No flowsheet data found.

## 2020-09-15 NOTE — Progress Notes (Signed)
[employer]ufacturing [facility]) Hospital Liaison Note:  Unfortunately [facility] is not able to offer a room today.  TOC made aware.  [facility] will follow up with [facility] and family tomorrow or sooner if room becomes available.   Please do not hesitate to call with questions.  Thank you.   Gar Ponto, RN [facility] Liaison  [place] are on AMION

## 2020-09-16 ENCOUNTER — Ambulatory Visit: Payer: Medicare Other

## 2020-09-16 DIAGNOSIS — I639 Cerebral infarction, unspecified: Secondary | ICD-10-CM | POA: Diagnosis not present

## 2020-09-16 DIAGNOSIS — I480 Paroxysmal atrial fibrillation: Secondary | ICD-10-CM | POA: Diagnosis not present

## 2020-09-16 NOTE — Consult Note (Signed)
   [facility] CM Inpatient Consult   09/16/2020  [name] 1930-03-14 [id number]   [facility] Organization [ACO] Patient:  Roy David [facility]   Reviewed for less than 30 days readmission. Chart reviewed and reveals the patient is currently transitioning to comfort care.   Natividad Brood, RN BSN [facility] Liaison  [phone] business mobile phone Toll free office [phone]  Fax number: [phone] [email or web address]

## 2020-09-16 NOTE — Progress Notes (Signed)
OT Cancellation Note and Discharge from OT services  Patient Details Name: Rhyker Silversmith MRN: [id number] DOB: 1930/04/04   Cancelled Treatment:    Reason Eval/Treat Not Completed: Other (Pt POC changed and will be going comfort care)  It has been a privilege to be a part of [place] care team. Please re-consult if anything changes. Thank you.   [place] 09/16/2020, 12:29 PM   Jesse Sans OTR/L Acute Rehabilitation Services Pager: [phone] Office: [phone]

## 2020-09-16 NOTE — Progress Notes (Signed)
[facility] ([facility]) Hospital Liaison note.  Unfortunately,  [facility] is unable to offer a room today. Hospital Liaison will follow up tomorrow or sooner if a room becomes available.   Please do not hesitate to call with questions.    Thank you for the opportunity to participate in this patient's care.  Chrislyn King, BSN, RN [facility] Hospital Liaison (listed on AMION under Hospice/[facility])    [phone]    (24h on call) 

## 2020-09-16 NOTE — Progress Notes (Signed)
PROGRESS NOTE    Roy David  PYK:[id number] DOB: 1930-09-08 DOA: 09/10/2020 PCP: [name], L.Marlou Sa, MD (Confirm with patient/family/NH records and if not entered, this HAS to be entered at [facility] point of entry. "No PCP" if truly none.)   Brief Narrative: (Start on day 1 of progress note - keep it brief and live)  Patient is a 86 year old Caucasian male with history of hypertension, hyperlipidemia, paroxysmal atrial fibrillation, recently diagnosed gastric cancer undergoing radiation treatment, hypothyroidism, anemia and ED stage III presented to ED for evaluation of altered mental status. In ED patient was found to have multiple acute versus subacute strokes.MRI brain revealed bilateral extensive watershed infarcts in the cerebral and cerebellar hemisphere with generalized atrophy and chronic small vessel ischemic changes. CT angiogram head and neck was negative for large vessel occlusion or stenosis.Neurology started patient on aspirin and statin.Echocardiogram showed grade 2 severe aortic stenosisbut cardiology recommended outpatient follow-up and does not think that patient is a candidate for TAVR secondary to ongoing GI bleed and prognosis.Patient'scondition continue to worsen and yesterday I had a long discussion with patient's wife and she decided to make patient DNR. Patient's wife also states that she is waiting for the nurse practitioner from palliative care and decided to make him comfort care only.  Assessment & Plan:   Principal Problem:   Acute CVA (cerebrovascular accident) [facility]) Active Problems:   Essential hypertension   Hypercholesterolemia   Paroxysmal atrial fibrillation (HCC)   Anemia   Gastric cancer (HCC)   CVA (cerebral vascular accident) ([place])   Protein-calorie malnutrition, severe   Advanced care planning/counseling discussion   Palliative care by specialist   Acute CVA Gastric cancer undergoing radiation treatment Paroxysmal atrial fibrillation not on  anticoagulation Hypokalemia Dehydration most likely secondary to nausea and poor oral intake. Anemia with recent GI bleeding Hypertension Hyperlipidemia Hypothyroidism Asthma CKD stage III Aortic stenosis: Palliative care consult:  PLAN Wife decided to make him comfort care only. All the other medications are discontinued except the pain medications, Haldol for agitation, Robinul for secretions, Ativan for anxiety and agitation and albuterol inhaler for breathing difficulty.   DVT prophylaxis: None Code Status: DNR/comfort care Family Communication: Wife present at the bedside Disposition Plan: Palliative care facility   Consultants:   Neurology  Procedures: (Don't include imaging studies which can be auto populated. Include things that cannot be auto populated i.e. Echo, Carotid and venous dopplers, Foley, Bipap, HD, tubes/drains, wound vac, central lines etc)  Echocardiogram  Antimicrobials: (specify start and planned stop date. Auto populated tables are space occupying and do not give end dates)     Subjective: Patient is seen and evaluated at the bedside this morning.  Wife is present at the bedside.  Patient is more awake and alert today and feeling more comfortable.  Patient denies any complaints at this time but he still looks very weak and cachectic.  Patient is waiting for placement and palliative care center  Objective: Vitals:   09/15/20 2015 09/15/20 2343 09/16/20 0349 09/16/20 1053  BP: (!) 141/81 120/74 120/78 124/69  Pulse: 82 71 72 68  Resp: 20 18 19 18   Temp: 99.1 F (37.3 C) 98.7 F (37.1 C) 98.7 F (37.1 C) 98.7 F (37.1 C)  TempSrc: Oral Oral Oral Oral  SpO2: 97% 97% 97% 98%  Weight:      Height:        Intake/Output Summary (Last 24 hours) at 09/16/2020 1440 Last data filed at 09/16/2020 1058 Gross per 24 hour  Intake 0  ml  Output 375 ml  Net -375 ml   Filed Weights   09/10/20 1935  Weight: 68 kg    Examination:  General exam:  Appears calm and comfortable  Respiratory system: Clear to auscultation. Respiratory effort normal. Cardiovascular system: S1 & S2 heard, RRR. No JVD, murmurs, rubs, gallops or clicks. No pedal edema. Gastrointestinal system: Abdomen is nondistended, soft and nontender. No organomegaly or masses felt. Normal bowel sounds heard. Central nervous system: Alert and oriented. No focal neurological deficits. Extremities: Symmetric  Skin: No rashes, lesions or ulcers Psychiatry: Judgement and insight appear normal. Mood & affect appropriate and better today    Data Reviewed: I have personally reviewed following labs and imaging studies  CBC: Recent Labs  Lab 09/10/20 2205 09/12/20 1144 09/13/20 0153 09/14/20 0347  WBC 6.1 5.4 7.5 8.3  HGB 9.1* 8.7* 8.9* 8.3*  HCT 29.3* 27.9* 28.4* 26.8*  MCV 91.6 91.5 92.8 92.1  PLT 263 218 238 408   Basic Metabolic Panel: Recent Labs  Lab 09/10/20 2205 09/13/20 0153 09/14/20 0347  NA 138 143 143  K 4.1 3.1* 3.6  CL 105 108 112*  CO2 22 25 24   GLUCOSE 100* 129* 120*  BUN 20 15 16   CREATININE 1.51* 1.41* 1.37*  CALCIUM 9.2 8.9 8.8*  MG  --  1.7  --    GFR: Estimated Creatinine Clearance: 35.8 mL/min (A) (by C-G formula based on SCr of 1.37 mg/dL (H)). Liver Function Tests: Recent Labs  Lab 09/10/20 2205  AST 39  ALT 13  ALKPHOS 57  BILITOT 1.1  PROT 5.8*  ALBUMIN 2.8*   No results for input(s): LIPASE, AMYLASE in the last 168 hours. No results for input(s): AMMONIA in the last 168 hours. Coagulation Profile: Recent Labs  Lab 09/10/20 2205  INR 1.1   Cardiac Enzymes: No results for input(s): CKTOTAL, CKMB, CKMBINDEX, TROPONINI in the last 168 hours. BNP (last 3 results) No results for input(s): PROBNP in the last 8760 hours. HbA1C: No results for input(s): HGBA1C in the last 72 hours. CBG: Recent Labs  Lab 09/10/20 2212  GLUCAP 97   Lipid Profile: No results for input(s): CHOL, HDL, LDLCALC, TRIG, CHOLHDL, LDLDIRECT  in the last 72 hours. Thyroid Function Tests: No results for input(s): TSH, T4TOTAL, FREET4, T3FREE, THYROIDAB in the last 72 hours. Anemia Panel: No results for input(s): VITAMINB12, FOLATE, FERRITIN, TIBC, IRON, RETICCTPCT in the last 72 hours. Sepsis Labs: No results for input(s): PROCALCITON, LATICACIDVEN in the last 168 hours.  Recent Results (from the past 240 hour(s))  Respiratory Panel by RT PCR (Flu A&B, Covid) - Nasopharyngeal Swab     Status: None   Collection Time: 09/10/20 11:51 PM   Specimen: Nasopharyngeal Swab  Result Value Ref Range Status   SARS Coronavirus 2 by RT PCR NEGATIVE NEGATIVE Final    Comment: (NOTE) SARS-CoV-2 target nucleic acids are NOT DETECTED.  The SARS-CoV-2 RNA is generally detectable in upper respiratoy specimens during the acute phase of infection. The lowest concentration of SARS-CoV-2 viral copies this assay can detect is 131 copies/mL. A negative result does not preclude SARS-Cov-2 infection and should not be used as the sole basis for treatment or other patient management decisions. A negative result may occur with  improper specimen collection/handling, submission of specimen other than nasopharyngeal swab, presence of viral mutation(s) within the areas targeted by this assay, and inadequate number of viral copies (<131 copies/mL). A negative result must be combined with clinical observations, patient history, and epidemiological  information. The expected result is Negative.  Fact Sheet for Patients:  PinkCheek.be  Fact Sheet for Healthcare Providers:  GravelBags.it  This test is no t yet approved or cleared by the [place] FDA and  has been authorized for detection and/or diagnosis of SARS-CoV-2 by FDA under an Emergency Use Authorization (EUA). This EUA will remain  in effect (meaning this test can be used) for the duration of the COVID-19 declaration under Section  564(b)(1) of the Act, 21 U.S.C. section 360bbb-3(b)(1), unless the authorization is terminated or revoked sooner.     Influenza A by PCR NEGATIVE NEGATIVE Final   Influenza B by PCR NEGATIVE NEGATIVE Final    Comment: (NOTE) The Xpert Xpress SARS-CoV-2/FLU/RSV assay is intended as an aid in  the diagnosis of influenza from Nasopharyngeal swab specimens and  should not be used as a sole basis for treatment. Nasal washings and  aspirates are unacceptable for Xpert Xpress SARS-CoV-2/FLU/RSV  testing.  Fact Sheet for Patients: PinkCheek.be  Fact Sheet for Healthcare Providers: GravelBags.it  This test is not yet approved or cleared by the [place] FDA and  has been authorized for detection and/or diagnosis of SARS-CoV-2 by  FDA under an Emergency Use Authorization (EUA). This EUA will remain  in effect (meaning this test can be used) for the duration of the  Covid-19 declaration under Section 564(b)(1) of the Act, 21  U.S.C. section 360bbb-3(b)(1), unless the authorization is  terminated or revoked. Performed at [facility], [place] [address] [place], [place] [address]          Radiology Studies: No results found.      Scheduled Meds: . feeding supplement (ENSURE ENLIVE)  237 mL Oral TID BM  . gabapentin  300 mg Oral Daily  . ondansetron (ZOFRAN) IV  4 mg Intravenous Q8H  . pantoprazole  40 mg Oral BID  . sucralfate  1 g Oral TID  . tetrahydrozoline  1 drop Both Eyes BID   Continuous Infusions:   LOS: 5 days    Time spent:     Edmonia Lynch, MD [facility] Pager [phone]  If 7PM-7AM, please contact night-coverage [email or web address] Password  09/16/2020, 2:40 PM

## 2020-09-16 NOTE — Progress Notes (Signed)
Roy David really is about the same.  He may be a little bit more alert.  He does seem to be comfortable.  He really does not have much using the right arm.  He is going to be going to [employer].  I talked to his wife yesterday.  She understands what is going on.  She wants to make sure that he is comfortable.  There is no way that she can take care of him at home.  I am unsure he is really eating that much.  I would hope that he is not having any issues with aspiration.  He has had no bleeding.  His last hemoglobin was 8.3 couple days ago.  His vital signs all look pretty good.  His temperature is 98.7.  Pulse 72.  Blood pressure 120/78.  His card exam does show the 2/6 systolic ejection murmur.  His heart is regular.  Has an occasional extra beat.  Lungs are clear bilaterally.  Abdomen is soft.  He has no guarding.  Bowel sounds are slightly decreased.  Again, our focus now is comfort.  Again it sounds like [employer] is where he will go.  I know that he will get wonderful end-of-life care.  It will certainly help his wife out that he is at [facility].  The staff on [address] have certainly done a great job with taking care of Roy David.  Lattie Haw, MD  [place] [phone]

## 2020-09-16 NOTE — Plan of Care (Signed)
  Problem: Education: Goal: Knowledge of secondary prevention will improve Outcome: Progressing Goal: Knowledge of patient specific risk factors addressed and post discharge goals established will improve Outcome: Progressing   Problem: Coping: Goal: Will verbalize positive feelings about self Outcome: Progressing Goal: Will identify appropriate support needs Outcome: Progressing   Problem: Health Behavior/Discharge Planning: Goal: Ability to manage health-related needs will improve Outcome: Progressing   Problem: Self-Care: Goal: Ability to participate in self-care as condition permits will improve Outcome: Progressing Goal: Verbalization of feelings and concerns over difficulty with self-care will improve Outcome: Progressing Goal: Ability to communicate needs accurately will improve Outcome: Progressing   Problem: Nutrition: Goal: Risk of aspiration will decrease Outcome: Progressing   Problem: Intracerebral Hemorrhage Tissue Perfusion: Goal: Complications of Intracerebral Hemorrhage will be minimized Outcome: Progressing   Problem: Ischemic Stroke/TIA Tissue Perfusion: Goal: Complications of ischemic stroke/TIA will be minimized Outcome: Progressing   Problem: Spontaneous Subarachnoid Hemorrhage Tissue Perfusion: Goal: Complications of Spontaneous Subarachnoid Hemorrhage will be minimized Outcome: Progressing   Problem: Education: Goal: Knowledge of General Education information will improve Description: Including pain rating scale, medication(s)/side effects and non-pharmacologic comfort measures Outcome: Progressing   Problem: Health Behavior/Discharge Planning: Goal: Ability to manage health-related needs will improve Outcome: Progressing   Problem: Clinical Measurements: Goal: Ability to maintain clinical measurements within normal limits will improve Outcome: Progressing Goal: Will remain free from infection Outcome: Progressing Goal: Diagnostic test  results will improve Outcome: Progressing Goal: Respiratory complications will improve Outcome: Progressing Goal: Cardiovascular complication will be avoided Outcome: Progressing   Problem: Activity: Goal: Risk for activity intolerance will decrease Outcome: Progressing   Problem: Nutrition: Goal: Adequate nutrition will be maintained Outcome: Progressing   Problem: Coping: Goal: Level of anxiety will decrease Outcome: Progressing   Problem: Elimination: Goal: Will not experience complications related to bowel motility Outcome: Progressing Goal: Will not experience complications related to urinary retention Outcome: Progressing   Problem: Pain Managment: Goal: General experience of comfort will improve Outcome: Progressing   Problem: Safety: Goal: Ability to remain free from injury will improve Outcome: Progressing   Problem: Skin Integrity: Goal: Risk for impaired skin integrity will decrease Outcome: Progressing   Philomena Course, RN 09/16/20 5:08 AM   5:07 AM

## 2020-09-16 NOTE — Progress Notes (Signed)
Palliative Medicine RN Note: Symptom check earlier this afternoon. No family was at bedside.  Patient was relaxed, lying with eyes closed. No response to gentle name calling, but I chose not to roughly stimulate him. Breathing is even and unlabored, mouth slightly open. PAINAD 0.  I will have our weekend coverage check his symptoms. Dr Marin Olp is always so caring and involved in his patients' care that I am not worried that Roy David will have out of control symptoms before we see him again.  Marjie Skiff Bless Lisenby, RN, BSN, [facility] Palliative Medicine Team 09/16/2020 3:30 PM Office [phone]

## 2020-09-16 NOTE — Progress Notes (Signed)
PT Cancellation Note  Patient Details Name: Roy David MRN: [id number] DOB: 20-Nov-1930   Cancelled Treatment:    Reason Eval/Treat Not Completed: Other (comment)PT will be signing off as pt is now comfort care, awaiting bed at [facility]. Please re consult if anything changes. Thanks.   Marguarite Arbour A Thai Hemrick 09/16/2020, 7:04 AM Marisa Severin, PT, DPT Acute Rehabilitation Services Pager [phone] Office [phone]

## 2020-09-17 DIAGNOSIS — Z515 Encounter for palliative care: Secondary | ICD-10-CM

## 2020-09-17 DIAGNOSIS — Z66 Do not resuscitate: Secondary | ICD-10-CM

## 2020-09-17 DIAGNOSIS — I480 Paroxysmal atrial fibrillation: Secondary | ICD-10-CM | POA: Diagnosis not present

## 2020-09-17 MED ORDER — BISACODYL 10 MG RE SUPP
10.0000 mg | Freq: Once | RECTAL | Status: AC
  Administered 2020-09-17: 10 mg via RECTAL
  Filled 2020-09-17: qty 1

## 2020-09-17 MED ORDER — DOCUSATE SODIUM 100 MG PO CAPS
200.0000 mg | ORAL_CAPSULE | Freq: Two times a day (BID) | ORAL | Status: DC
  Administered 2020-09-17 – 2020-09-19 (×5): 200 mg via ORAL
  Filled 2020-09-17 (×6): qty 2

## 2020-09-17 MED ORDER — SENNOSIDES-DOCUSATE SODIUM 8.6-50 MG PO TABS: 1.0000 | ORAL_TABLET | Freq: Two times a day (BID) | ORAL | Status: DC

## 2020-09-17 NOTE — Progress Notes (Signed)
   Palliative Medicine Inpatient Follow Up Note   Reason for Consultation: Establishing goals of care  HPI/Patient Profile: 86 y.o. male  with past medical history of asthma, aortic stenosis, TIA, retinal vein thrombus (on anticoag), locally advanced gastric cancer with ongoing significant GI bleeding- currently on radiation therapy with plans to start immunotherapy after,  admitted on 09/10/2020 with altered mental status (lethargy, speech difficulties, L hemianopsia, RUE weakness)-  Workup reveals extensive watershed infarcts, L PCA infarct (subacute, likely due to hypoperfusion). Palliative consulted per patient's spouse's request.   Today's Discussion (09/17/2020): Chart reviewed.  Per [facility] review patient has not required any additional as needed medications.  It does appear per flow sheet review that he has not had a bowel movement in a number of days, therefore I will order a suppository.  I met with Gwyndolyn Saxon at bedside this morning.  He shares with me that at the present time he is not experiencing any pain, shortness of breath, or nausea.  I noted that his breakfast was in front of him and ask if he was feeling hungry.  He shared that at the moment he is not but he would like it kept in front of him in case he develops hunger.  I asked Feras if there was anything I could help him with this morning or if he had any concerns, he denied these.  At this juncture we are still awaiting a [facility] bed, there may be a delay given the amount of patients awaiting a bed.  Discussed the importance of continued conversation with family and their  medical providers regarding overall plan of care and treatment options, ensuring decisions are within the context of the patients values and GOCs.  Objective Assessment: Vital Signs Vitals:   09/17/20 0035 09/17/20 0320  BP: 100/65 (!) 143/73  Pulse: 69 74  Resp: 20 19  Temp: 98.1 F (36.7 C) 97.8 F (36.6 C)  SpO2: 95% 98%    Intake/Output  Summary (Last 24 hours) at 09/17/2020 0804 Last data filed at 09/17/2020 0500 Gross per 24 hour  Intake 15 ml  Output 775 ml  Net -760 ml   Last Weight  Most recent update: 09/10/2020  7:36 PM   Weight  68 kg (150 lb)           Gen: Elderly M in NAD HEENT: moist mucous membranes CV: Regular rate and rhythm, no murmurs rubs or gallops PULM: On RA clear to auscultation bilaterally. No wheezes/rales/rhonchi  ABD: soft/nontender/nondistended/normal bowel sounds  EXT: No edema  Neuro: Alert and oriented x2  SUMMARY OF RECOMMENDATIONS   DNAR/DNI  Comfort Care  Awaiting a [facility] Bed  Time Spent: 25 Greater than 50% of the time was spent in counseling and coordination of care ______________________________________________________________________________________ [place] Team Team Cell Phone: [phone] Please utilize secure chat with additional questions, if there is no response within 30 minutes please call the above phone number  Palliative Medicine Team providers are available by phone from 7am to 7pm daily and can be reached through the team cell phone.  Should this patient require assistance outside of these hours, please call the patient's attending physician.

## 2020-09-17 NOTE — Progress Notes (Signed)
Patient is on comfort measures awaiting for [facility], he appear comfortable, denies pain, he is not oriented to time but to place and person. Wife at bedside reports patient is being constipated, last bm was Friday a week ago, add on colace in addition to dulcolax suppository.

## 2020-09-17 NOTE — Progress Notes (Signed)
[facility] ([facility]) Hospital Liaison note.  Unfortunately,  [facility] is unable to offer a room today. Hospital Liaison will follow up tomorrow or sooner if a room becomes available.   Please do not hesitate to call with questions.    Thank you for the opportunity to participate in this patient's care.  Chrislyn King, BSN, RN [facility] Hospital Liaison (listed on AMION under Hospice/[facility])    [phone]    (24h on call) 

## 2020-09-18 DIAGNOSIS — I480 Paroxysmal atrial fibrillation: Secondary | ICD-10-CM | POA: Diagnosis not present

## 2020-09-18 NOTE — Progress Notes (Addendum)
Palliative Medicine Inpatient Follow Up Note   Reason for Consultation: Establishing goals of care  HPI/Patient Profile: 86 y.o. male  with past medical history of asthma, aortic stenosis, TIA, retinal vein thrombus (on anticoag), locally advanced gastric cancer with ongoing significant GI bleeding- currently on radiation therapy with plans to start immunotherapy after,  admitted on 09/10/2020 with altered mental status (lethargy, speech difficulties, L hemianopsia, RUE weakness)-  Workup reveals extensive watershed infarcts, L PCA infarct (subacute, likely due to hypoperfusion). Palliative consulted per patient's spouse's request.   Today's Discussion (09/18/2020): Chart reviewed.  Per [facility] review patient has not required any additional as needed medications.    Patient is in no distress this morning.  He remains to be alert and oriented.  Met with nursing staff at bedside, patient remains to have no appetite despite offered foods.  [facility]waiting [facility] bed, there may be a delay given the amount of patients awaiting a bed.  Discussed the importance of continued conversation with family and their  medical providers regarding overall plan of care and treatment options, ensuring decisions are within the context of the patients values and GOCs.  Objective Assessment: Vital Signs Vitals:   09/17/20 1932 09/18/20 0725  BP: (!) 139/93 137/90  Pulse: 78 73  Resp: 18 18  Temp: 98.4 F (36.9 C) 98 F (36.7 C)  SpO2: 96% 96%    Intake/Output Summary (Last 24 hours) at 09/18/2020 0826 Last data filed at 09/17/2020 2334 Gross per 24 hour  Intake 30 ml  Output 300 ml  Net -270 ml   Last Weight  Most recent update: 09/10/2020  7:36 PM   Weight  68 kg (150 lb)           Gen: Elderly M in NAD HEENT: moist mucous membranes CV: Regular rate and rhythm, no murmurs rubs or gallops PULM: On RA clear to auscultation bilaterally. No wheezes/rales/rhonchi  ABD:  soft/nontender/nondistended/normal bowel sounds  EXT: No edema  Neuro: Alert and oriented x2-3  SUMMARY OF RECOMMENDATIONS   DNAR/DNI  Comfort Care  Awaiting a [facility] Bed  Time Spent: 15 Greater than 50% of the time was spent in counseling and coordination of care ______________________________________________________________________________________ [place] Team Team Cell Phone: [phone] Please utilize secure chat with additional questions, if there is no response within 30 minutes please call the above phone number  Palliative Medicine Team providers are available by phone from 7am to 7pm daily and can be reached through the team cell phone.  Should this patient require assistance outside of these hours, please call the patient's attending physician.

## 2020-09-18 NOTE — Progress Notes (Signed)
Manufacturing engineer [facility])  [facility] does not have any open beds for Mr. Claiborne today.  [facility] will update as soon as one is open.  Venia Carbon RN, BSN, [facility] Liaison

## 2020-09-18 NOTE — Progress Notes (Signed)
Patient is on comfort measures ,awaiting for [facility], he appears comfortable, awake and alert , interactive, he denies pain, he is not oriented to time but to place and person. Family is not at bedside this am. Plan: [facility] to [facility] when there is a bed.

## 2020-09-19 ENCOUNTER — Ambulatory Visit: Payer: Medicare Other

## 2020-09-19 DIAGNOSIS — Z515 Encounter for palliative care: Secondary | ICD-10-CM | POA: Diagnosis not present

## 2020-09-19 DIAGNOSIS — Z7189 Other specified counseling: Secondary | ICD-10-CM | POA: Diagnosis not present

## 2020-09-19 DIAGNOSIS — C168 Malignant neoplasm of overlapping sites of stomach: Secondary | ICD-10-CM | POA: Diagnosis not present

## 2020-09-19 DIAGNOSIS — Z66 Do not resuscitate: Secondary | ICD-10-CM | POA: Diagnosis not present

## 2020-09-19 NOTE — Progress Notes (Signed)
PROGRESS NOTE    Roy David  DJS:[id number] DOB: [birth year] DOA: 09/10/2020 PCP: [name], L.Marlou Sa, MD    Brief Narrative:  86 year old gentleman with history of asthma, aortic stenosis, TIA, retinal vein thrombosis on anticoagulation, locally advanced gastric cancer with ongoing significant GI bleeding was admitted to the hospital with altered mental status with lethargy, speech difficulties, left hemianopsia and right upper extremity weakness.  His work-up revealed extensive watershed infarct, left PCA infarct.  Palliative care was consulted and patient is started on comfort care measures.   Assessment & Plan:   Principal Problem:   Acute CVA (cerebrovascular accident) [facility]) Active Problems:   Essential hypertension   Hypercholesterolemia   Paroxysmal atrial fibrillation (HCC)   Anemia   Gastric cancer (HCC)   CVA (cerebral vascular accident) ([place])   Protein-calorie malnutrition, severe   Advanced care planning/counseling discussion   Palliative care by specialist   DNR (do not resuscitate)   Terminal care  Acute ischemic stroke Paroxysmal A. Fib Locally advanced gastric cancer Severe protein calorie malnutrition End-of-life care  Plan: All symptom control medications available.  Patient is currently comfortable and provided end-of-life care in the hospital. He is a stable to transfer to inpatient hospice whenever bed is available.   DVT prophylaxis: Comfort care   Code Status: Comfort care Family Communication: Wife and daughter at bedside Disposition Plan: Status is: Inpatient  Remains inpatient appropriate because:Unsafe d/c plan   Dispo: The patient is from: Home              Anticipated d/c is to: Hospice home               Anticipated d/c date is: 1 day              Patient currently is medically stable to d/c. Patient is only medically stable to transfer to hospice level of care.    Consultants:   Palliative medicine  Procedures:    None  Antimicrobials:   None   Subjective: Patient seen and examined.  Remains in the hospital.  He was comfortable with family surrounding him.  He denied any complaints.  Wife reported that he looks comfortable, however he has not have any interest in eating.  Objective: Vitals:   09/18/20 0725 09/18/20 2039 09/19/20 0739 09/19/20 1159  BP: 137/90 120/83 116/61 113/70  Pulse: 73 (!) 105 80 82  Resp: 18 16 (!) 21 18  Temp: 98 F (36.7 C) 98.7 F (37.1 C) 98.9 F (37.2 C) 99.2 F (37.3 C)  TempSrc: Oral Oral Oral Oral  SpO2: 96% 95% 96% 96%  Weight:      Height:       No intake or output data in the 24 hours ending 09/19/20 1459 Filed Weights   09/10/20 1935  Weight: 68 kg    Examination:  General exam: Appears calm and comfortable , chronically sick looking, alert and awake, oriented to place and person. Thin and frail looking.  On room air.    Data Reviewed: I have personally reviewed following labs and imaging studies  CBC: Recent Labs  Lab 09/13/20 0153 09/14/20 0347  WBC 7.5 8.3  HGB 8.9* 8.3*  HCT 28.4* 26.8*  MCV 92.8 92.1  PLT 238 885   Basic Metabolic Panel: Recent Labs  Lab 09/13/20 0153 09/14/20 0347  NA 143 143  K 3.1* 3.6  CL 108 112*  CO2 25 24  GLUCOSE 129* 120*  BUN 15 16  CREATININE 1.41* 1.37*  CALCIUM 8.9 8.8*  MG 1.7  --    GFR: Estimated Creatinine Clearance: 35.8 mL/min (A) (by C-G formula based on SCr of 1.37 mg/dL (H)). Liver Function Tests: No results for input(s): AST, ALT, ALKPHOS, BILITOT, PROT, ALBUMIN in the last 168 hours. No results for input(s): LIPASE, AMYLASE in the last 168 hours. No results for input(s): AMMONIA in the last 168 hours. Coagulation Profile: No results for input(s): INR, PROTIME in the last 168 hours. Cardiac Enzymes: No results for input(s): CKTOTAL, CKMB, CKMBINDEX, TROPONINI in the last 168 hours. BNP (last 3 results) No results for input(s): PROBNP in the last 8760  hours. HbA1C: No results for input(s): HGBA1C in the last 72 hours. CBG: No results for input(s): GLUCAP in the last 168 hours. Lipid Profile: No results for input(s): CHOL, HDL, LDLCALC, TRIG, CHOLHDL, LDLDIRECT in the last 72 hours. Thyroid Function Tests: No results for input(s): TSH, T4TOTAL, FREET4, T3FREE, THYROIDAB in the last 72 hours. Anemia Panel: No results for input(s): VITAMINB12, FOLATE, FERRITIN, TIBC, IRON, RETICCTPCT in the last 72 hours. Sepsis Labs: No results for input(s): PROCALCITON, LATICACIDVEN in the last 168 hours.  Recent Results (from the past 240 hour(s))  Respiratory Panel by RT PCR (Flu A&B, Covid) - Nasopharyngeal Swab     Status: None   Collection Time: 09/10/20 11:51 PM   Specimen: Nasopharyngeal Swab  Result Value Ref Range Status   SARS Coronavirus 2 by RT PCR NEGATIVE NEGATIVE Final    Comment: (NOTE) SARS-CoV-2 target nucleic acids are NOT DETECTED.  The SARS-CoV-2 RNA is generally detectable in upper respiratoy specimens during the acute phase of infection. The lowest concentration of SARS-CoV-2 viral copies this assay can detect is 131 copies/mL. A negative result does not preclude SARS-Cov-2 infection and should not be used as the sole basis for treatment or other patient management decisions. A negative result may occur with  improper specimen collection/handling, submission of specimen other than nasopharyngeal swab, presence of viral mutation(s) within the areas targeted by this assay, and inadequate number of viral copies (<131 copies/mL). A negative result must be combined with clinical observations, patient history, and epidemiological information. The expected result is Negative.  Fact Sheet for Patients:  PinkCheek.be  Fact Sheet for Healthcare Providers:  GravelBags.it  This test is no t yet approved or cleared by the [place] FDA and  has been authorized for  detection and/or diagnosis of SARS-CoV-2 by FDA under an Emergency Use Authorization (EUA). This EUA will remain  in effect (meaning this test can be used) for the duration of the COVID-19 declaration under Section 564(b)(1) of the Act, 21 U.S.C. section 360bbb-3(b)(1), unless the authorization is terminated or revoked sooner.     Influenza A by PCR NEGATIVE NEGATIVE Final   Influenza B by PCR NEGATIVE NEGATIVE Final    Comment: (NOTE) The Xpert Xpress SARS-CoV-2/FLU/RSV assay is intended as an aid in  the diagnosis of influenza from Nasopharyngeal swab specimens and  should not be used as a sole basis for treatment. Nasal washings and  aspirates are unacceptable for Xpert Xpress SARS-CoV-2/FLU/RSV  testing.  Fact Sheet for Patients: PinkCheek.be  Fact Sheet for Healthcare Providers: GravelBags.it  This test is not yet approved or cleared by the [place] FDA and  has been authorized for detection and/or diagnosis of SARS-CoV-2 by  FDA under an Emergency Use Authorization (EUA). This EUA will remain  in effect (meaning this test can be used) for the duration of the  Covid-19 declaration under Section 564(b)(1) of the  Act, 21  U.S.C. section 360bbb-3(b)(1), unless the authorization is  terminated or revoked. Performed at [facility], [place] [address], [place], [place] [address]          Radiology Studies: No results found.      Scheduled Meds: . docusate sodium  200 mg Oral BID  . feeding supplement (ENSURE ENLIVE)  237 mL Oral TID BM  . gabapentin  300 mg Oral Daily  . ondansetron (ZOFRAN) IV  4 mg Intravenous Q8H  . pantoprazole  40 mg Oral BID  . sucralfate  1 g Oral TID  . tetrahydrozoline  1 drop Both Eyes BID   Continuous Infusions:   LOS: 8 days    Time spent: 20 minutes    Barb Merino, MD [facility] Pager [phone]

## 2020-09-19 NOTE — Progress Notes (Signed)
Daily Progress Note   Patient Name: Roy David       Date: 09/19/2020 DOB: 1930/03/31  Age: 86 y.o. MRN#: [id number] Attending Physician: Barb Merino, MD Primary Care Physician: Aurea Graff.Marlou Sa, MD Admit Date: 09/10/2020  Reason for Consultation/Follow-up: Establishing goals of care and Terminal Care  Subjective: Evaluated patient for terminal symptom check. Patient awake, but minimally responsive. Is not eating or drinking. Appears comfortable.  Spouse is at bedside.  Review of Systems  Unable to perform ROS: Acuity of condition    Length of Stay: 8  Current Medications: Scheduled Meds:  . docusate sodium  200 mg Oral BID  . feeding supplement (ENSURE ENLIVE)  237 mL Oral TID BM  . gabapentin  300 mg Oral Daily  . ondansetron (ZOFRAN) IV  4 mg Intravenous Q8H  . pantoprazole  40 mg Oral BID  . sucralfate  1 g Oral TID  . tetrahydrozoline  1 drop Both Eyes BID    Continuous Infusions:   PRN Meds: [DISCONTINUED] acetaminophen **OR** acetaminophen (TYLENOL) oral liquid 160 mg/5 mL **OR** acetaminophen, albuterol, antiseptic oral rinse, glycopyrrolate **OR** glycopyrrolate **OR** glycopyrrolate, haloperidol **OR** haloperidol **OR** haloperidol lactate, LORazepam **OR** LORazepam **OR** LORazepam, morphine CONCENTRATE **OR** morphine CONCENTRATE, polyvinyl alcohol, senna-docusate  Physical Exam Vitals and nursing note reviewed.  Constitutional:      Appearance: He is ill-appearing.     Comments: frail  Pulmonary:     Effort: Pulmonary effort is normal.             Vital Signs: BP 113/70 (BP Location: Left Arm)   Pulse 82   Temp 99.2 F (37.3 C) (Oral)   Resp 18   Ht 5' 8.5" (1.74 m)   Wt 68 kg   SpO2 96%   BMI 22.48 kg/m  SpO2: SpO2: 96 % O2 Device: O2  Device: Room Air O2 Flow Rate:    Intake/output summary: No intake or output data in the 24 hours ending 09/19/20 1337 LBM: Last BM Date: 08/22/20 Baseline Weight: Weight: 68 kg Most recent weight: Weight: 68 kg       Palliative Assessment/Data: PPS: 10%    Flowsheet Rows     Most Recent Value  Intake Tab  Referral Department Hospitalist  Unit at Time of Referral Intermediate Care Unit  Palliative Care Primary Diagnosis Cancer  Date  Notified 09/12/20  Palliative Care Type New Palliative care  Reason for referral Clarify Goals of Care  Date of Admission 09/10/20  Date first seen by Palliative Care 09/13/20  # of days Palliative referral response time 1 Day(s)  # of days IP prior to Palliative referral 2  Clinical Assessment  Psychosocial & Spiritual Assessment  Palliative Care Outcomes      Patient Active Problem List   Diagnosis Date Noted  . DNR (do not resuscitate)   . Terminal care   . Advanced care planning/counseling discussion   . Palliative care by specialist   . Protein-calorie malnutrition, severe 09/12/2020  . CVA (cerebral vascular accident) ([place]) 09/11/2020  . Acute CVA (cerebrovascular accident) ([place]) 09/10/2020  . Gastric cancer ([place]) 08/25/2020  . Goals of care, counseling/discussion 08/25/2020  . Iron deficiency anemia due to chronic blood loss 01/18/2019  . Status post placement of implantable loop recorder 03/05/2018  . Anemia   . Hypochromic-microcytic anemia   . Symptomatic anemia 01/15/2018  . Paroxysmal atrial fibrillation ([place]) 03/06/2016  . Central retinal artery occlusion of right eye 11/23/2015  . Aortic valve stenosis, nonrheumatic 11/23/2015  . Essential hypertension 11/23/2015  . Hypercholesterolemia 11/23/2015    Palliative Care Assessment & Plan   Patient Profile: 86 y.o.malewith past medical history of asthma, aortic stenosis, TIA, retinal vein thrombus (on anticoag), locally advanced gastric cancer with ongoing significant  GI bleeding- currently on radiation therapy with plans to start immunotherapy after,admitted on 10/2/2021with altered mental status (lethargy, speech difficulties, L hemianopsia, RUE weakness)- Workup reveals extensive watershed infarcts, L PCA infarct (subacute, likely due to hypoperfusion). Palliative consulted per patient's spouse's request.   Assessment/Recommendations/Plan   Appears comfortable with no complaints  Continue current comfort measures as ordered  Awaiting bed at [facility] and Additional Recommendations:  Limitations on Scope of Treatment: Full Comfort Care  Code Status:  DNR  Prognosis:   < 2 weeks  Discharge Planning:  Hospice facility  Care plan was discussed with patient's spouse.   Thank you for allowing the Palliative Medicine Team to assist in the care of this patient.    Total time: 27 mins  Greater than 50%  of this time was spent counseling and coordinating care related to the above assessment and plan.  Mariana Kaufman, AGNP-C Palliative Medicine   Please contact Palliative Medicine Team phone at [phone] for questions and concerns.

## 2020-09-19 NOTE — TOC Progression Note (Signed)
Transition of Care [facility]) - Progression Note    Patient Details  Name: Saadiq Poche MRN: [id number] Date of Birth: Jan 08, 1930  Transition of Care [facility]) CM/SW [place], LCSW Phone Number: 09/19/2020, 10:24 AM  Clinical Narrative:    Per Bing Ree, no beds available as of yet. CSW spoke with patient's spouse at bedside and offered [place] as a second option. She stated she does not want patient to go anywhere but [facility], though she reported the patient was receiving excellent care here.     Expected Discharge Plan: [place] Barriers to Discharge: Hospice Bed not available  Expected Discharge Plan and Services Expected Discharge Plan: [place]       Living arrangements for the past 2 months: Single Family Home                                       Social Determinants of Health (SDOH) Interventions    Readmission Risk Interventions No flowsheet data found.

## 2020-09-19 NOTE — Progress Notes (Signed)
Manufacturing engineer [facility])  [facility] does not have any beds to offer today.  [facility] will update TOC and family once a bed is available.  Venia Carbon RN, BSN, [facility] Liaison

## 2020-09-20 ENCOUNTER — Ambulatory Visit: Payer: Medicare Other

## 2020-09-20 NOTE — TOC Transition Note (Signed)
Transition of Care [facility]) - CM/SW Discharge Note   Patient Details  Name: Roy David MRN: [id number] Date of Birth: 1930-07-04  Transition of Care [facility]) CM/SW Contact:  Geralynn Ochs, LCSW Phone Number: 09/20/2020, 1:41 PM   Clinical Narrative:   Nurse to call report to [phone].    Final next level of care: [place] Barriers to Discharge: Barriers Resolved   Patient Goals and CMS Choice Patient states their goals for this hospitalization and ongoing recovery are:: patient unable to participate in goal setting CMS Medicare.gov Compare Post Acute Care list provided to:: Patient Represenative (must comment) Choice offered to / list presented to : Spouse  Discharge Placement                Patient to be transferred to facility by: [place] Name of family member notified: Voicemail for [facility] Patient and family notified of of transfer: 09/20/20  Discharge Plan and Services                                     Social Determinants of Health (SDOH) Interventions     Readmission Risk Interventions No flowsheet data found.

## 2020-09-20 NOTE — Progress Notes (Signed)
PROGRESS NOTE    Clebert Wenger  WPY:[id number] DOB: 04/27/1930 DOA: 09/10/2020 PCP: [name], L.Marlou Sa, MD    Brief Narrative:  86 year old gentleman with history of asthma, aortic stenosis, TIA, retinal vein thrombosis on anticoagulation, locally advanced gastric cancer with ongoing significant GI bleeding was admitted to the hospital with altered mental status with lethargy, speech difficulties, left hemianopsia and right upper extremity weakness.  His work-up revealed extensive watershed infarct, left PCA infarct.  Palliative care was consulted and patient is started on comfort care measures.   Assessment & Plan:   Principal Problem:   Acute CVA (cerebrovascular accident) [facility]) Active Problems:   Essential hypertension   Hypercholesterolemia   Paroxysmal atrial fibrillation (HCC)   Anemia   Gastric cancer (HCC)   CVA (cerebral vascular accident) ([place])   Protein-calorie malnutrition, severe   Advanced care planning/counseling discussion   Palliative care by specialist   DNR (do not resuscitate)   Terminal care  Acute ischemic stroke Paroxysmal A. Fib Locally advanced gastric cancer Severe protein calorie malnutrition End-of-life care  Plan: All symptom control medications available.  Patient is currently comfortable and provided with end-of-life care in the hospital. He is stable to transfer to inpatient hospice whenever bed is available.   DVT prophylaxis: Comfort care   Code Status: Comfort care Family Communication: No family at bedside today. Disposition Plan: Status is: Inpatient  Remains inpatient appropriate because:Unsafe d/c plan   Dispo: The patient is from: Home              Anticipated d/c is to: Hospice home               Anticipated d/c date is: Whenever bed is available.              Patient currently is medically stable to d/c. Patient is only medically stable to transfer to hospice level of care.    Consultants:   Palliative  medicine  Procedures:   None  Antimicrobials:   None   Subjective: Seen and examined.  Overnight no events.  Remains comfortable.  On my evaluation, he was mostly sleepy and unable to keep up with a conversation.  Looks comfortable on room air.  Objective: Vitals:   09/19/20 1159 09/19/20 1659 09/19/20 2035 09/20/20 0829  BP: 113/70 123/80 122/76 110/79  Pulse: 82 91 88 (!) 121  Resp: 18 18 18 18   Temp: 99.2 F (37.3 C) 98.5 F (36.9 C) 98 F (36.7 C) 99.1 F (37.3 C)  TempSrc: Oral Axillary Oral Oral  SpO2: 96% 96% 98% 94%  Weight:      Height:        Intake/Output Summary (Last 24 hours) at 09/20/2020 1051 Last data filed at 09/20/2020 0325 Gross per 24 hour  Intake 20 ml  Output 800 ml  Net -780 ml   Filed Weights   09/10/20 1935  Weight: 68 kg    Examination:  General exam: Appears calm and comfortable , chronically sick looking, sleepy.  On room air.  Not in any obvious distress.   Data Reviewed: I have personally reviewed following labs and imaging studies  CBC: Recent Labs  Lab 09/14/20 0347  WBC 8.3  HGB 8.3*  HCT 26.8*  MCV 92.1  PLT 053   Basic Metabolic Panel: Recent Labs  Lab 09/14/20 0347  NA 143  K 3.6  CL 112*  CO2 24  GLUCOSE 120*  BUN 16  CREATININE 1.37*  CALCIUM 8.8*   GFR: Estimated Creatinine Clearance: 35.8  mL/min (A) (by C-G formula based on SCr of 1.37 mg/dL (H)). Liver Function Tests: No results for input(s): AST, ALT, ALKPHOS, BILITOT, PROT, ALBUMIN in the last 168 hours. No results for input(s): LIPASE, AMYLASE in the last 168 hours. No results for input(s): AMMONIA in the last 168 hours. Coagulation Profile: No results for input(s): INR, PROTIME in the last 168 hours. Cardiac Enzymes: No results for input(s): CKTOTAL, CKMB, CKMBINDEX, TROPONINI in the last 168 hours. BNP (last 3 results) No results for input(s): PROBNP in the last 8760 hours. HbA1C: No results for input(s): HGBA1C in the last 72  hours. CBG: No results for input(s): GLUCAP in the last 168 hours. Lipid Profile: No results for input(s): CHOL, HDL, LDLCALC, TRIG, CHOLHDL, LDLDIRECT in the last 72 hours. Thyroid Function Tests: No results for input(s): TSH, T4TOTAL, FREET4, T3FREE, THYROIDAB in the last 72 hours. Anemia Panel: No results for input(s): VITAMINB12, FOLATE, FERRITIN, TIBC, IRON, RETICCTPCT in the last 72 hours. Sepsis Labs: No results for input(s): PROCALCITON, LATICACIDVEN in the last 168 hours.  Recent Results (from the past 240 hour(s))  Respiratory Panel by RT PCR (Flu A&B, Covid) - Nasopharyngeal Swab     Status: None   Collection Time: 09/10/20 11:51 PM   Specimen: Nasopharyngeal Swab  Result Value Ref Range Status   SARS Coronavirus 2 by RT PCR NEGATIVE NEGATIVE Final    Comment: (NOTE) SARS-CoV-2 target nucleic acids are NOT DETECTED.  The SARS-CoV-2 RNA is generally detectable in upper respiratoy specimens during the acute phase of infection. The lowest concentration of SARS-CoV-2 viral copies this assay can detect is 131 copies/mL. A negative result does not preclude SARS-Cov-2 infection and should not be used as the sole basis for treatment or other patient management decisions. A negative result may occur with  improper specimen collection/handling, submission of specimen other than nasopharyngeal swab, presence of viral mutation(s) within the areas targeted by this assay, and inadequate number of viral copies (<131 copies/mL). A negative result must be combined with clinical observations, patient history, and epidemiological information. The expected result is Negative.  Fact Sheet for Patients:  PinkCheek.be  Fact Sheet for Healthcare Providers:  GravelBags.it  This test is no t yet approved or cleared by the [place] FDA and  has been authorized for detection and/or diagnosis of SARS-CoV-2 by FDA under an Emergency  Use Authorization (EUA). This EUA will remain  in effect (meaning this test can be used) for the duration of the COVID-19 declaration under Section 564(b)(1) of the Act, 21 U.S.C. section 360bbb-3(b)(1), unless the authorization is terminated or revoked sooner.     Influenza A by PCR NEGATIVE NEGATIVE Final   Influenza B by PCR NEGATIVE NEGATIVE Final    Comment: (NOTE) The Xpert Xpress SARS-CoV-2/FLU/RSV assay is intended as an aid in  the diagnosis of influenza from Nasopharyngeal swab specimens and  should not be used as a sole basis for treatment. Nasal washings and  aspirates are unacceptable for Xpert Xpress SARS-CoV-2/FLU/RSV  testing.  Fact Sheet for Patients: PinkCheek.be  Fact Sheet for Healthcare Providers: GravelBags.it  This test is not yet approved or cleared by the [place] FDA and  has been authorized for detection and/or diagnosis of SARS-CoV-2 by  FDA under an Emergency Use Authorization (EUA). This EUA will remain  in effect (meaning this test can be used) for the duration of the  Covid-19 declaration under Section 564(b)(1) of the Act, 21  U.S.C. section 360bbb-3(b)(1), unless the authorization is  terminated  or revoked. Performed at [facility], [place] [address] [place], [place] [address]          Radiology Studies: No results found.      Scheduled Meds: . docusate sodium  200 mg Oral BID  . feeding supplement (ENSURE ENLIVE)  237 mL Oral TID BM  . gabapentin  300 mg Oral Daily  . ondansetron (ZOFRAN) IV  4 mg Intravenous Q8H  . pantoprazole  40 mg Oral BID  . sucralfate  1 g Oral TID  . tetrahydrozoline  1 drop Both Eyes BID   Continuous Infusions:   LOS: 9 days    Time spent: 20 minutes    Barb Merino, MD [facility] Pager [phone]

## 2020-09-20 NOTE — Discharge Summary (Signed)
Physician Discharge Summary  Roy David MCN:[id number] DOB: May 22, 1930 DOA: 09/10/2020  PCP: [name], L.Marlou Sa, MD  Admit date: 09/10/2020 Discharge date: 09/20/2020  Admitted From: home  Disposition:  Hospice home   Recommendations for Outpatient Follow-up:  As per hospice    Discharge Condition: Poorcritical  CODE STATUS:comfort care  Diet recommendation: comfort food   Discharge Summary: 86 year old gentleman with history of asthma, aortic stenosis, TIA, retinal vein thrombosis on anticoagulation, locally advanced gastric cancer with ongoing significant GI bleeding was admitted to the hospital with altered mental status with lethargy, speech difficulties, left hemianopsia and right upper extremity weakness.  His work-up revealed extensive watershed infarct, left PCA infarct.  Palliative care was consulted and patient is started on comfort care measures.  Acute ischemic stroke Paroxysmal A. Fib Locally advanced gastric cancer Severe protein calorie malnutrition Failure to thrive Advanced physical debility End-of-life care  Plan: All symptom control medications available.  Patient is currently comfortable and provided with end-of-life care in the hospital. Transfer to inpatient hospice today to provide end-of-life care. Medicate for comfort before transferring. Patient can continue PPI as much as he can do.  Discharge Diagnoses:  Principal Problem:   Acute CVA (cerebrovascular accident) [facility]) Active Problems:   Essential hypertension   Hypercholesterolemia   Paroxysmal atrial fibrillation (HCC)   Anemia   Gastric cancer (HCC)   CVA (cerebral vascular accident) ([place])   Protein-calorie malnutrition, severe   Advanced care planning/counseling discussion   Palliative care by specialist   DNR (do not resuscitate)   Terminal care    Discharge Instructions  Discharge Instructions    Diet general   Complete by: As directed      Allergies as of 09/20/2020       Reactions   Bee Venom Anaphylaxis   Wasp and yellow jackets      Medication List    STOP taking these medications   doxycycline 100 MG capsule Commonly known as: MONODOX   EPINEPHrine 0.3 mg/0.3 mL Soaj injection Commonly known as: EPI-PEN   ezetimibe-simvastatin 10-20 MG tablet Commonly known as: VYTORIN   levothyroxine 100 MCG tablet Commonly known as: SYNTHROID   ondansetron 4 MG tablet Commonly known as: Zofran   predniSONE 20 MG tablet Commonly known as: DELTASONE   triamcinolone cream 0.1 % Commonly known as: KENALOG     TAKE these medications   Asmanex (60 Metered Doses) 220 MCG/INH inhaler Generic drug: mometasone Inhale 1 puff into the lungs daily as needed (asthma flare up).   gabapentin 300 MG capsule Commonly known as: NEURONTIN Take 300 mg by mouth daily.   pantoprazole 40 MG tablet Commonly known as: PROTONIX Take 40 mg by mouth 2 (two) times daily.   sucralfate 1 GM/10ML suspension Commonly known as: CARAFATE Take 1 g by mouth 3 (three) times daily.   VISINE OP Place 1 drop into both eyes 2 (two) times daily.       Allergies  Allergen Reactions  . Bee Venom Anaphylaxis    Wasp and yellow jackets    Consultations:  Palliative and hospice   Procedures/Studies: CT Head Wo Contrast  Result Date: 09/10/2020 CLINICAL DATA:  Mental status change EXAM: CT HEAD WITHOUT CONTRAST TECHNIQUE: Contiguous axial images were obtained from the base of the skull through the vertex without intravenous contrast. COMPARISON:  August 21, 2020 FINDINGS: Brain: There is an area of hypodensity seen along the posterior right occipital lobe with loss of gray-white differentiation. No mass effect or midline shift. Prior lacunar infarct seen  within the right basal ganglia. There is dilatation the ventricles and sulci consistent with age-related atrophy. Low-attenuation changes in the deep white matter consistent with small vessel ischemia. Vascular: No  hyperdense vessel or unexpected calcification. Skull: The skull is intact. No fracture or focal lesion identified. Sinuses/Orbits: The visualized paranasal sinuses and mastoid air cells are clear. The orbits and globes intact. Other: None IMPRESSION: Acute/subacute infarct involving the right occipital lobe. Findings consistent with age related atrophy and chronic small vessel ischemia Electronically Signed   By: Prudencio Pair M.D.   On: 09/10/2020 23:27   MR BRAIN WO CONTRAST  Result Date: 09/11/2020 CLINICAL DATA:  Stroke follow-up EXAM: MRI HEAD WITHOUT CONTRAST TECHNIQUE: Multiplanar, multiecho pulse sequences of the brain and surrounding structures were obtained without intravenous contrast. COMPARISON:  CTA head neck 09/11/2020 FINDINGS: Brain: There are bilateral watershed zone infarcts affecting both cerebral and cerebellar hemispheres. Largest confluent area of ischemia is in the right occipital lobe. Multifocal hyperintense T2-weighted signal within the white matter. There is generalized atrophy without lobar predilection. Multiple foci of chronic hemorrhage in the lateral right cerebellum. Normal midline structures. Vascular: Normal flow voids. Skull and upper cervical spine: Normal marrow signal. Sinuses/Orbits: Negative.  Bilateral scleral bands. Other: None IMPRESSION: 1. 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 Largest confluent area of ischemia is in the right occipital lobe. 2. No acute hemorrhage or mass effect. 3. Generalized atrophy and chronic small vessel ischemic changes. Electronically Signed   By: Ulyses Jarred M.D.   On: 09/11/2020 02:37   CT CHEST ABDOMEN PELVIS W CONTRAST  Result Date: 08/21/2020 CLINICAL DATA:  Gastric mass in GI bleeding EXAM: CT CHEST, ABDOMEN, AND PELVIS WITH CONTRAST TECHNIQUE: Multidetector CT imaging of the chest, abdomen and pelvis was performed following the standard protocol during bolus  administration of intravenous contrast. CONTRAST:  140mL OMNIPAQUE IOHEXOL 300 MG/ML  SOLN COMPARISON:  12/31/2019 FINDINGS: CT CHEST FINDINGS Cardiovascular: Calcified atheromatous plaque in the thoracic aorta. 4.2 cm ascending aortic dilation, no comparison of this area. Central pulmonary vasculature, unremarkable on venous phase assessment. Calcifications of the aortic valve and mitral annular calcification. Calcified coronary artery disease.  No pericardial fluid. Mediastinum/Nodes: No adenopathy in the chest. Esophagus mildly patulous. Thickening of the gastroesophageal junction. Lungs/Pleura: Background pulmonary emphysema. Mild subpleural reticulation. Scattered tree-in-bud opacities, for instance on image 47 of series 4 in the posterior RIGHT upper lobe. Biapical pleural and parenchymal scarring. Calcified nodule in the RIGHT mid chest is compatible with granuloma. Airways are patent. No consolidation. No pleural effusion. Musculoskeletal: No acute musculoskeletal process. Spinal degenerative changes in the thoracic spine. CT ABDOMEN PELVIS FINDINGS Hepatobiliary: Low-density hepatic lesion in the RIGHT hepatic lobe is stable compared to the prior study, well-circumscribed and showing water density compatible with small cyst. Liver contours are smooth. No suspicious hepatic lesion. Portal vein is patent. Hepatic veins are patent. No pericholecystic stranding.  No biliary duct distension. Pancreas: No peripancreatic stranding. Cystic area in the pancreatic body 2.1 x 1.7 cm, previously 2.1 x 1.6 cm for Spleen: Areas of low attenuation in the peripheral spleen with wedge-shaped characteristics suggesting small splenic infarcts. More geographic area of low attenuation seen along the lateral margin of the spleen measuring approximately 12 mm. Adrenals/Urinary Tract: Adrenal glands are normal. Symmetric renal enhancement. Cortical scarring of bilateral kidneys. Mild fullness of the proximal RIGHT ureter is  stable, no hydronephrosis. Stomach/Bowel: Stomach is under distended limiting assessment. No discrete mass is noted. There is diffuse gastric  thickening with mural stratification. Significance uncertain though certainly could be seen in the setting of diffuse gastric neoplasm or with gastritis. Mild perigastric stranding may reflect changes of recent intervention Signs of colonic diverticulosis.  No sign of diverticulitis. Vascular/Lymphatic: Calcified and noncalcified atheromatous plaque of the abdominal aorta, extending into the iliac vessels. Small lymph nodes throughout the hepatic gastric ligament without pathologic enlargement in the 4-5 mm range. However, in the coronal plane there is diffuse nodular and hazy soft tissue stranding throughout the hepatic gastric ligament. Early local tumor infiltration is considered. No retroperitoneal adenopathy. No pelvic lymphadenopathy. Reproductive: Prostate with brachytherapy seeds in place. Other: No ascites. Musculoskeletal: No acute musculoskeletal finding. No destructive bone process. IMPRESSION: 1. Diffuse gastric thickening with mural stratification in this patient with known gastric neoplasm. Superimposed gastritis could accentuate this appearance. 2. Coronal images display soft tissue infiltrating the hepatic gastric recess best seen on image 30 of series 94 where there is a focus extending from the wall of the stomach approximately 12 x 8 mm. Findings are concerning for early tumor infiltration beyond the wall the stomach. Hazy nodularity can be seen throughout the hepatic gastric ligament best seen on image 24 of series 6. Also some nodularity along short gastrics along the greater curvature of the stomach, also suspicious for extra gastric involvement. These nodules track along the gastrosplenic ligament there is general hazy nodularity in this area. 3. Stable cystic area in the pancreas, attention on follow-up. Dedicated MRI/MRCP could be considered as  clinically indicated for further assessment in 6 months. 4. Areas of low attenuation in the peripheral spleen with wedge-shaped characteristics suggesting small splenic infarcts. More geographic area of low attenuation along the lateral margin of the spleen measuring approximately 12 mm. These may represent a small infarct. Consider close follow-up to exclude the possibility of small splenic lesion/metastasis though this would be unusual. MRI could be potentially helpful in differentiating these considerations. 5. Scattered tree-in-bud opacities in the posterior RIGHT upper lobe may reflect an infectious or inflammatory process. Recommend attention on follow-up. 6. 4.2 cm ascending aortic dilation, compatible with mild aneurysmal dilation. Recommend annual imaging followup by CTA or MRA. This recommendation follows 2010 ACCF/AHA/AATS/ACR/ASA/SCA/SCAI/SIR/STS/SVM Guidelines for the Diagnosis and Management of Patients with Thoracic Aortic Disease. Circulation. 2010; 121: Z610-R604. Aortic aneurysm NOS (ICD10-I71.9) 7. Prostate with brachytherapy seeds in place. 8. Emphysema and aortic atherosclerosis. Aortic Atherosclerosis (ICD10-I70.0) and Emphysema (ICD10-J43.9). Electronically Signed   By: Zetta Bills M.D.   On: 08/21/2020 17:52   EEG adult  Result Date: 09/12/2020 Lora Havens, MD     09/12/2020 12:16 PM Patient Name: Roy David MRN: [id number] Epilepsy Attending: Lora Havens Referring Physician/Provider: Dr. Rosalin Hawking Date: 10/4/121 Duration: 25.25 minutes Patient history: 86 year old male who initially presented with lethargy, altered mental status, speech difficulties, left hemianopsia Sia and right upper extremity weakness.  MRI showed extensive bilateral watershed zone infarcts affecting both cerebral and cerebellar hemispheres.  EEG to evaluate for seizures. Level of alertness: Awake AEDs during EEG study: Gabapentin Technical aspects: This EEG study was done with scalp electrodes  positioned according to the 10-20 International system of electrode placement. Electrical activity was acquired at a sampling rate of 500Hz  and reviewed with a high frequency filter of 70Hz  and a low frequency filter of 1Hz . EEG data were recorded continuously and digitally stored. Description: No definite posterior dominant rhythm was seen.  EEG showed continuous generalized polymorphic 5 to 6 Hz theta slowing as well as intermittent 2 to 3  Hz delta slowing.  Hyperventilation and photic stimulation were not performed.   ABNORMALITY -Continuous slow, generalized IMPRESSION: This study is suggestive of moderate diffuse encephalopathy, nonspecific etiology.  No seizures or epileptiform discharges were seen throughout the recording. Lora Havens   ECHOCARDIOGRAM COMPLETE  Result Date: 09/11/2020    ECHOCARDIOGRAM REPORT   Patient Name:   Roy David Date of Exam: 09/11/2020 Medical Rec #:  [id number]    Height:       68.5 in Accession #:    [id number]   Weight:       150.0 lb Date of Birth:  1930-10-11     BSA:          1.818 m Patient Age:    75 years     BP:           140/71 mmHg Patient Gender: M            HR:           80 bpm. Exam Location:  Inpatient Procedure: 2D Echo, Cardiac Doppler and Color Doppler Indications:    Stroke  History:        Patient has prior history of Echocardiogram examinations, most                 recent 02/29/2020. Aortic Valve Disease; Risk                 Factors:Dyslipidemia. CKD.  Sonographer:    Clayton Lefort RDCS (AE) Referring Phys: [id number] Cleaster Corin PATEL  Sonographer Comments: Technically challenging study due to patient movement. IMPRESSIONS  1. Left ventricular ejection fraction, by estimation, is 60 to 65%. The left ventricle has normal function. The left ventricle has no regional wall motion abnormalities. There is severe left ventricular hypertrophy. Left ventricular diastolic parameters  are consistent with Grade I diastolic dysfunction (impaired relaxation).  2. Right  ventricular systolic function is normal. The right ventricular size is normal.  3. The mitral valve is normal in structure. Trivial mitral valve regurgitation. No evidence of mitral stenosis. Moderate mitral annular calcification.  4. The aortic valve is abnormal. There is severe calcifcation of the aortic valve. There is severe thickening of the aortic valve. Aortic valve regurgitation is moderate. Moderate to severe aortic valve stenosis. Aortic valve mean gradient measures 32.7  mmHg. Aortic valve peak gradient measures 57.9 mmHg. Aortic valve area, by VTI measures 0.76 cm.  5. The inferior vena cava is normal in size with greater than 50% respiratory variability, suggesting right atrial pressure of 3 mmHg. FINDINGS  Left Ventricle: Left ventricular ejection fraction, by estimation, is 60 to 65%. The left ventricle has normal function. The left ventricle has no regional wall motion abnormalities. The left ventricular internal cavity size was normal in size. There is  severe left ventricular hypertrophy. Left ventricular diastolic parameters are consistent with Grade I diastolic dysfunction (impaired relaxation). Normal left ventricular filling pressure. Right Ventricle: The right ventricular size is normal. No increase in right ventricular wall thickness. Right ventricular systolic function is normal. Left Atrium: Left atrial size was normal in size. Right Atrium: Right atrial size was normal in size. Pericardium: There is no evidence of pericardial effusion. Mitral Valve: The mitral valve is normal in structure. There is moderate thickening of the mitral valve leaflet(s). There is moderate calcification of the mitral valve leaflet(s). Moderate mitral annular calcification. Trivial mitral valve regurgitation.  No evidence of mitral valve stenosis. Tricuspid Valve: The tricuspid valve is normal in structure. Tricuspid  valve regurgitation is not demonstrated. No evidence of tricuspid stenosis. Aortic Valve: The  aortic valve is abnormal. There is severe calcifcation of the aortic valve. There is severe thickening of the aortic valve. There is severe aortic valve annular calcification. Aortic valve regurgitation is moderate. Aortic regurgitation  PHT measures 225 msec. Moderate to severe aortic stenosis is present. Aortic valve mean gradient measures 32.7 mmHg. Aortic valve peak gradient measures 57.9 mmHg. Aortic valve area, by VTI measures 0.76 cm. Pulmonic Valve: The pulmonic valve was not well visualized. Pulmonic valve regurgitation is not visualized. No evidence of pulmonic stenosis. Aorta: The aortic root is normal in size and structure. Pulmonary Artery: Indeterminant PASP, inadequate TR jet. Venous: The inferior vena cava is normal in size with greater than 50% respiratory variability, suggesting right atrial pressure of 3 mmHg. IAS/Shunts: No atrial level shunt detected by color flow Doppler.  LEFT VENTRICLE PLAX 2D LVIDd:         4.50 cm  Diastology LVIDs:         3.00 cm  LV e' medial:    4.79 cm/s LV PW:         1.50 cm  LV E/e' medial:  10.6 LV IVS:        1.50 cm  LV e' lateral:   6.74 cm/s LVOT diam:     2.00 cm  LV E/e' lateral: 7.6 LV SV:         63 LV SV Index:   34 LVOT Area:     3.14 cm  RIGHT VENTRICLE            IVC RV Basal diam:  3.40 cm    IVC diam: 0.80 cm RV S prime:     9.14 cm/s TAPSE (M-mode): 2.1 cm LEFT ATRIUM           Index       RIGHT ATRIUM           Index LA diam:      2.40 cm 1.32 cm/m  RA Area:     16.40 cm LA Vol (A2C): 38.8 ml 21.34 ml/m RA Volume:   41.50 ml  22.82 ml/m LA Vol (A4C): 40.0 ml 22.00 ml/m  AORTIC VALVE AV Area (Vmax):    0.74 cm AV Area (Vmean):   0.71 cm AV Area (VTI):     0.76 cm AV Vmax:           380.52 cm/s AV Vmean:          269.609 cm/s AV VTI:            0.820 m AV Peak Grad:      57.9 mmHg AV Mean Grad:      32.7 mmHg LVOT Vmax:         89.10 cm/s LVOT Vmean:        60.750 cm/s LVOT VTI:          0.199 m LVOT/AV VTI ratio: 0.24 AI PHT:            225  msec  AORTA Ao Root diam: 3.70 cm MITRAL VALVE MV Area (PHT): 3.65 cm    SHUNTS MV Decel Time: 208 msec    Systemic VTI:  0.20 m MV E velocity: 50.90 cm/s  Systemic Diam: 2.00 cm MV A velocity: 89.20 cm/s MV E/A ratio:  0.57 Carlyle Dolly MD Electronically signed by Carlyle Dolly MD Signature Date/Time: 09/11/2020/9:32:47 AM    Final    CT ANGIO HEAD CODE  STROKE  Result Date: 09/11/2020 CLINICAL DATA:  Aphasia EXAM: CT ANGIOGRAPHY HEAD AND NECK TECHNIQUE: Multidetector CT imaging of the head and neck was performed using the standard protocol during bolus administration of intravenous contrast. Multiplanar CT image reconstructions and MIPs were obtained to evaluate the vascular anatomy. Carotid stenosis measurements (when applicable) are obtained utilizing NASCET criteria, using the distal internal carotid diameter as the denominator. CONTRAST:  67mL OMNIPAQUE IOHEXOL 350 MG/ML SOLN COMPARISON:  None. FINDINGS: CTA NECK FINDINGS SKELETON: There is no bony spinal canal stenosis. No lytic or blastic lesion. OTHER NECK: Normal pharynx, larynx and major salivary glands. No cervical lymphadenopathy. Unremarkable thyroid gland. UPPER CHEST: No pneumothorax or pleural effusion. No nodules or masses. AORTIC ARCH: There is mild calcific atherosclerosis of the aortic arch. There is no aneurysm, dissection or hemodynamically significant stenosis of the visualized portion of the aorta. Conventional 3 vessel aortic branching pattern. The visualized proximal subclavian arteries are widely patent. RIGHT CAROTID SYSTEM: No dissection, occlusion or aneurysm. Mild atherosclerotic calcification at the carotid bifurcation without hemodynamically significant stenosis. LEFT CAROTID SYSTEM: No dissection, occlusion or aneurysm. Mild atherosclerotic calcification at the carotid bifurcation without hemodynamically significant stenosis. VERTEBRAL ARTERIES: Codominant configuration. Both origins are clearly patent. There is no  dissection, occlusion or flow-limiting stenosis to the skull base (V1-V3 segments). CTA HEAD FINDINGS POSTERIOR CIRCULATION: --Vertebral arteries: Normal V4 segments. --Inferior cerebellar arteries: Normal. --Basilar artery: Normal. --Superior cerebellar arteries: Normal. --Posterior cerebral arteries (PCA): Normal. ANTERIOR CIRCULATION: --Intracranial internal carotid arteries: Atherosclerotic calcification of the internal carotid arteries at the skull base without hemodynamically significant stenosis. --Anterior cerebral arteries (ACA): Normal. Both A1 segments are present. Patent anterior communicating artery (a-comm). --Middle cerebral arteries (MCA): Normal. VENOUS SINUSES: As permitted by contrast timing, patent. ANATOMIC VARIANTS: None Review of the MIP images confirms the above findings. IMPRESSION: 1. No emergent large vessel occlusion or high-grade stenosis of the intracranial arteries. 2. Mild bilateral carotid bifurcation atherosclerosis without hemodynamically significant stenosis by NASCET criteria. Aortic Atherosclerosis (ICD10-I70.0). Electronically Signed   By: Ulyses Jarred M.D.   On: 09/11/2020 00:30   CT ANGIO NECK CODE STROKE  Result Date: 09/11/2020 CLINICAL DATA:  Aphasia EXAM: CT ANGIOGRAPHY HEAD AND NECK TECHNIQUE: Multidetector CT imaging of the head and neck was performed using the standard protocol during bolus administration of intravenous contrast. Multiplanar CT image reconstructions and MIPs were obtained to evaluate the vascular anatomy. Carotid stenosis measurements (when applicable) are obtained utilizing NASCET criteria, using the distal internal carotid diameter as the denominator. CONTRAST:  92mL OMNIPAQUE IOHEXOL 350 MG/ML SOLN COMPARISON:  None. FINDINGS: CTA NECK FINDINGS SKELETON: There is no bony spinal canal stenosis. No lytic or blastic lesion. OTHER NECK: Normal pharynx, larynx and major salivary glands. No cervical lymphadenopathy. Unremarkable thyroid gland. UPPER  CHEST: No pneumothorax or pleural effusion. No nodules or masses. AORTIC ARCH: There is mild calcific atherosclerosis of the aortic arch. There is no aneurysm, dissection or hemodynamically significant stenosis of the visualized portion of the aorta. Conventional 3 vessel aortic branching pattern. The visualized proximal subclavian arteries are widely patent. RIGHT CAROTID SYSTEM: No dissection, occlusion or aneurysm. Mild atherosclerotic calcification at the carotid bifurcation without hemodynamically significant stenosis. LEFT CAROTID SYSTEM: No dissection, occlusion or aneurysm. Mild atherosclerotic calcification at the carotid bifurcation without hemodynamically significant stenosis. VERTEBRAL ARTERIES: Codominant configuration. Both origins are clearly patent. There is no dissection, occlusion or flow-limiting stenosis to the skull base (V1-V3 segments). CTA HEAD FINDINGS POSTERIOR CIRCULATION: --Vertebral arteries: Normal V4 segments. --Inferior cerebellar arteries:  Normal. --Basilar artery: Normal. --Superior cerebellar arteries: Normal. --Posterior cerebral arteries (PCA): Normal. ANTERIOR CIRCULATION: --Intracranial internal carotid arteries: Atherosclerotic calcification of the internal carotid arteries at the skull base without hemodynamically significant stenosis. --Anterior cerebral arteries (ACA): Normal. Both A1 segments are present. Patent anterior communicating artery (a-comm). --Middle cerebral arteries (MCA): Normal. VENOUS SINUSES: As permitted by contrast timing, patent. ANATOMIC VARIANTS: None Review of the MIP images confirms the above findings. IMPRESSION: 1. No emergent large vessel occlusion or high-grade stenosis of the intracranial arteries. 2. Mild bilateral carotid bifurcation atherosclerosis without hemodynamically significant stenosis by NASCET criteria. Aortic Atherosclerosis (ICD10-I70.0). Electronically Signed   By: Ulyses Jarred M.D.   On: 09/11/2020 00:30    (Echo, Carotid,  EGD, Colonoscopy, ERCP)    Subjective: Patient seen and examined.  No overnight events.  Remains comfortable.  Remains with eyes closed and sleepy.   Discharge Exam: Vitals:   09/19/20 2035 09/20/20 0829  BP: 122/76 110/79  Pulse: 88 (!) 121  Resp: 18 18  Temp: 98 F (36.7 C) 99.1 F (37.3 C)  SpO2: 98% 94%   Vitals:   09/19/20 1159 09/19/20 1659 09/19/20 2035 09/20/20 0829  BP: 113/70 123/80 122/76 110/79  Pulse: 82 91 88 (!) 121  Resp: 18 18 18 18   Temp: 99.2 F (37.3 C) 98.5 F (36.9 C) 98 F (36.7 C) 99.1 F (37.3 C)  TempSrc: Oral Axillary Oral Oral  SpO2: 96% 96% 98% 94%  Weight:      Height:        General: Patient looks comfortable, on room air, tired and sleepy.  Not keen to conversation. Cardiovascular: RRR, S1/S2 +, no rubs, no gallops Respiratory: No added sounds. Abdominal: Soft, NT, ND, bowel sounds +    The results of significant diagnostics from this hospitalization (including imaging, microbiology, ancillary and laboratory) are listed below for reference.     Microbiology: Recent Results (from the past 240 hour(s))  Respiratory Panel by RT PCR (Flu A&B, Covid) - Nasopharyngeal Swab     Status: None   Collection Time: 09/10/20 11:51 PM   Specimen: Nasopharyngeal Swab  Result Value Ref Range Status   SARS Coronavirus 2 by RT PCR NEGATIVE NEGATIVE Final    Comment: (NOTE) SARS-CoV-2 target nucleic acids are NOT DETECTED.  The SARS-CoV-2 RNA is generally detectable in upper respiratoy specimens during the acute phase of infection. The lowest concentration of SARS-CoV-2 viral copies this assay can detect is 131 copies/mL. A negative result does not preclude SARS-Cov-2 infection and should not be used as the sole basis for treatment or other patient management decisions. A negative result may occur with  improper specimen collection/handling, submission of specimen other than nasopharyngeal swab, presence of viral mutation(s) within the areas  targeted by this assay, and inadequate number of viral copies (<131 copies/mL). A negative result must be combined with clinical observations, patient history, and epidemiological information. The expected result is Negative.  Fact Sheet for Patients:  PinkCheek.be  Fact Sheet for Healthcare Providers:  GravelBags.it  This test is no t yet approved or cleared by the [place] FDA and  has been authorized for detection and/or diagnosis of SARS-CoV-2 by FDA under an Emergency Use Authorization (EUA). This EUA will remain  in effect (meaning this test can be used) for the duration of the COVID-19 declaration under Section 564(b)(1) of the Act, 21 U.S.C. section 360bbb-3(b)(1), unless the authorization is terminated or revoked sooner.     Influenza A by PCR NEGATIVE NEGATIVE Final  Influenza B by PCR NEGATIVE NEGATIVE Final    Comment: (NOTE) The Xpert Xpress SARS-CoV-2/FLU/RSV assay is intended as an aid in  the diagnosis of influenza from Nasopharyngeal swab specimens and  should not be used as a sole basis for treatment. Nasal washings and  aspirates are unacceptable for Xpert Xpress SARS-CoV-2/FLU/RSV  testing.  Fact Sheet for Patients: PinkCheek.be  Fact Sheet for Healthcare Providers: GravelBags.it  This test is not yet approved or cleared by the [place] FDA and  has been authorized for detection and/or diagnosis of SARS-CoV-2 by  FDA under an Emergency Use Authorization (EUA). This EUA will remain  in effect (meaning this test can be used) for the duration of the  Covid-19 declaration under Section 564(b)(1) of the Act, 21  U.S.C. section 360bbb-3(b)(1), unless the authorization is  terminated or revoked. Performed at [facility], [place] [address], [place], [place] [address]      Labs: BNP (last 3 results) No results for input(s): BNP in  the last 8760 hours. Basic Metabolic Panel: Recent Labs  Lab 09/14/20 0347  NA 143  K 3.6  CL 112*  CO2 24  GLUCOSE 120*  BUN 16  CREATININE 1.37*  CALCIUM 8.8*   Liver Function Tests: No results for input(s): AST, ALT, ALKPHOS, BILITOT, PROT, ALBUMIN in the last 168 hours. No results for input(s): LIPASE, AMYLASE in the last 168 hours. No results for input(s): AMMONIA in the last 168 hours. CBC: Recent Labs  Lab 09/14/20 0347  WBC 8.3  HGB 8.3*  HCT 26.8*  MCV 92.1  PLT 183   Cardiac Enzymes: No results for input(s): CKTOTAL, CKMB, CKMBINDEX, TROPONINI in the last 168 hours. BNP: Invalid input(s): POCBNP CBG: No results for input(s): GLUCAP in the last 168 hours. D-Dimer No results for input(s): DDIMER in the last 72 hours. Hgb A1c No results for input(s): HGBA1C in the last 72 hours. Lipid Profile No results for input(s): CHOL, HDL, LDLCALC, TRIG, CHOLHDL, LDLDIRECT in the last 72 hours. Thyroid function studies No results for input(s): TSH, T4TOTAL, T3FREE, THYROIDAB in the last 72 hours.  Invalid input(s): FREET3 Anemia work up No results for input(s): VITAMINB12, FOLATE, FERRITIN, TIBC, IRON, RETICCTPCT in the last 72 hours. Urinalysis    Component Value Date/Time   COLORURINE AMBER (A) 09/12/2020 1400   APPEARANCEUR CLOUDY (A) 09/12/2020 1400   LABSPEC 1.034 (H) 09/12/2020 1400   PHURINE 5.0 09/12/2020 1400   GLUCOSEU NEGATIVE 09/12/2020 1400   HGBUR NEGATIVE 09/12/2020 1400   BILIRUBINUR NEGATIVE 09/12/2020 1400   KETONESUR 5 (A) 09/12/2020 1400   PROTEINUR NEGATIVE 09/12/2020 1400   NITRITE NEGATIVE 09/12/2020 1400   LEUKOCYTESUR NEGATIVE 09/12/2020 1400   Sepsis Labs Invalid input(s): PROCALCITONIN,  WBC,  LACTICIDVEN Microbiology Recent Results (from the past 240 hour(s))  Respiratory Panel by RT PCR (Flu A&B, Covid) - Nasopharyngeal Swab     Status: None   Collection Time: 09/10/20 11:51 PM   Specimen: Nasopharyngeal Swab  Result Value  Ref Range Status   SARS Coronavirus 2 by RT PCR NEGATIVE NEGATIVE Final    Comment: (NOTE) SARS-CoV-2 target nucleic acids are NOT DETECTED.  The SARS-CoV-2 RNA is generally detectable in upper respiratoy specimens during the acute phase of infection. The lowest concentration of SARS-CoV-2 viral copies this assay can detect is 131 copies/mL. A negative result does not preclude SARS-Cov-2 infection and should not be used as the sole basis for treatment or other patient management decisions. A negative result may occur  with  improper specimen collection/handling, submission of specimen other than nasopharyngeal swab, presence of viral mutation(s) within the areas targeted by this assay, and inadequate number of viral copies (<131 copies/mL). A negative result must be combined with clinical observations, patient history, and epidemiological information. The expected result is Negative.  Fact Sheet for Patients:  PinkCheek.be  Fact Sheet for Healthcare Providers:  GravelBags.it  This test is no t yet approved or cleared by the [place] FDA and  has been authorized for detection and/or diagnosis of SARS-CoV-2 by FDA under an Emergency Use Authorization (EUA). This EUA will remain  in effect (meaning this test can be used) for the duration of the COVID-19 declaration under Section 564(b)(1) of the Act, 21 U.S.C. section 360bbb-3(b)(1), unless the authorization is terminated or revoked sooner.     Influenza A by PCR NEGATIVE NEGATIVE Final   Influenza B by PCR NEGATIVE NEGATIVE Final    Comment: (NOTE) The Xpert Xpress SARS-CoV-2/FLU/RSV assay is intended as an aid in  the diagnosis of influenza from Nasopharyngeal swab specimens and  should not be used as a sole basis for treatment. Nasal washings and  aspirates are unacceptable for Xpert Xpress SARS-CoV-2/FLU/RSV  testing.  Fact Sheet for  Patients: PinkCheek.be  Fact Sheet for Healthcare Providers: GravelBags.it  This test is not yet approved or cleared by the [place] FDA and  has been authorized for detection and/or diagnosis of SARS-CoV-2 by  FDA under an Emergency Use Authorization (EUA). This EUA will remain  in effect (meaning this test can be used) for the duration of the  Covid-19 declaration under Section 564(b)(1) of the Act, 21  U.S.C. section 360bbb-3(b)(1), unless the authorization is  terminated or revoked. Performed at [facility], [place] [address] [place], [place] [address]      Time coordinating discharge:  25 minutes  SIGNED:   Barb Merino, MD  [facility] 09/20/2020, 11:15 AM

## 2020-09-20 NOTE — Progress Notes (Signed)
Called report to [place] at [facility] facility. Discharge paperwork placed in packet to be taken to facility. Patient dressed and has all belongings. Patient is being transported by [facility] and is about to leave the unit.

## 2020-09-20 NOTE — Progress Notes (Signed)
[facility] Liaison note.   Received request from TOC manager for family interest in [facility] with request for transfer today. Chart reviewed and eligibility confirmed.  Spoke to patient and family to confirm interest and explain services. Family agreeable to transfer today. CSW aware.  Registration paperwork will need to be completed. Dr. Donald Hertweck to assume care per family.    RN please call report to [phone] once consents are signed for [facility] and please arrange transport for patient.  Thank you,   Melissa O'Bryant, BSN, RN    [facility] Liaison (listed on [facility] under [facility])   [phone]      

## 2020-09-21 ENCOUNTER — Ambulatory Visit: Payer: Medicare Other

## 2020-09-28 NOTE — Telephone Encounter (Signed)
Error

## 2020-10-10 DEATH — deceased

## 2022-08-19 IMAGING — MR MR HEAD W/O CM
12 of 13 series · 44 of 48 positions shown · non-contrast
Comparison: CTA head neck 09/11/2020

CLINICAL DATA: Stroke follow-up

EXAM:
MRI HEAD WITHOUT CONTRAST
TECHNIQUE: Multiplanar, multiecho pulse sequences of the brain and surrounding
structures were obtained without intravenous contrast.

[Series 11: DWI · axial · 3.0mm · 0.88mm/px · z∈[-19,+115]mm · 8 of 102 slices shown (1 of 4)]
[im 1/102]
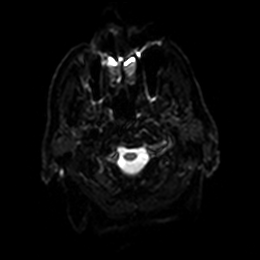
[im 15/102]
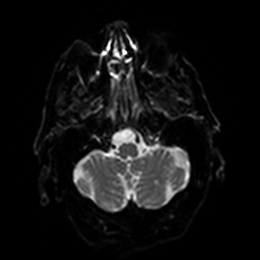
[im 29/102]
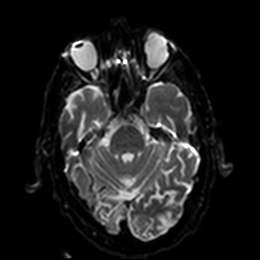
[im 44/102]
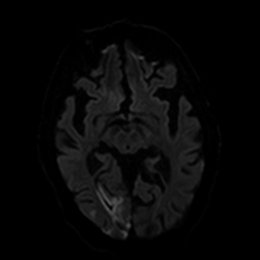
[im 58/102]
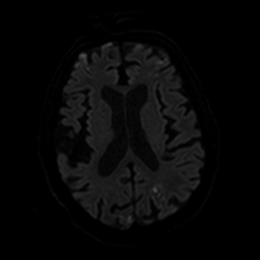
[im 73/102]
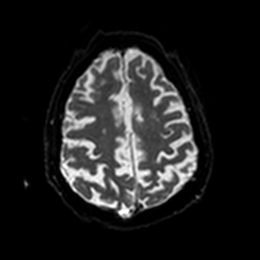
[im 87/102]
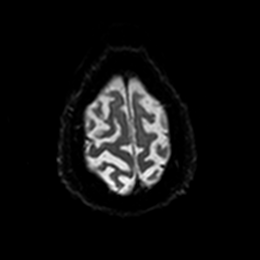
[im 102/102]
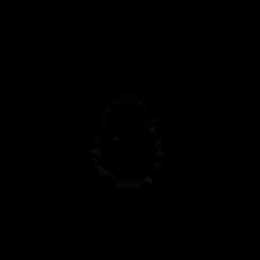

[Series 12: DWI · axial · 3.0mm · 0.88mm/px · z∈[-19,+115]mm · 4 of 51 slices shown (2 of 4)]
[im 1/51]
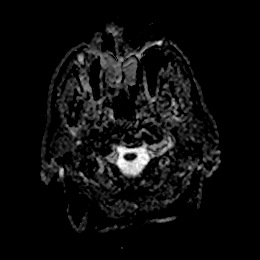
[im 17/51]
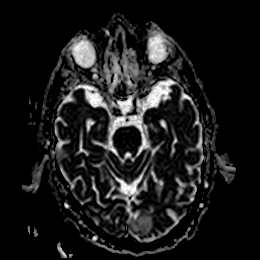
[im 34/51]
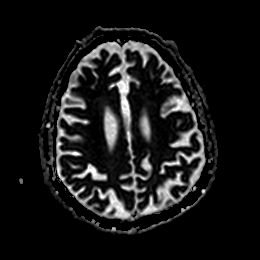
[im 51/51]
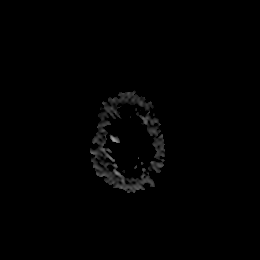

[Series 13: DWI · coronal · 4.0mm · 0.88mm/px · 5 of 74 slices shown (3 of 4)]
[im 1/74]
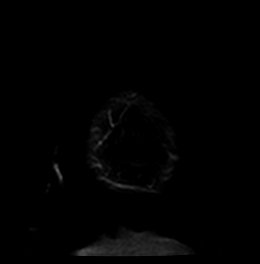
[im 19/74]
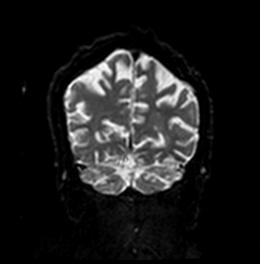
[im 37/74]
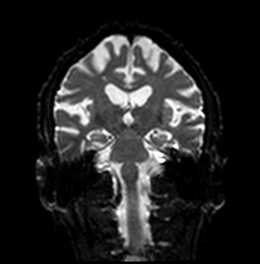
[im 55/74]
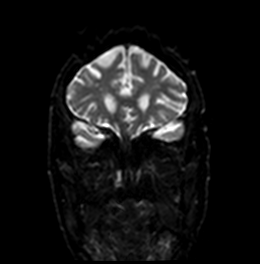
[im 74/74]
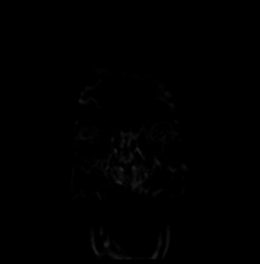

[Series 14: DWI · coronal · 4.0mm · 0.88mm/px · 3 of 37 slices shown (4 of 4)]
[im 1/37]
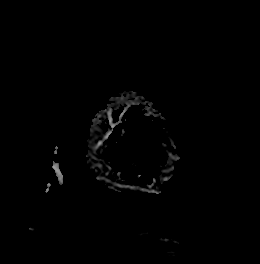
[im 19/37]
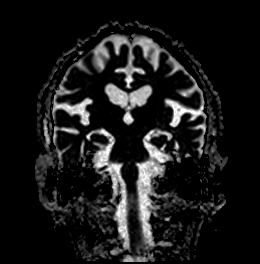
[im 37/37]
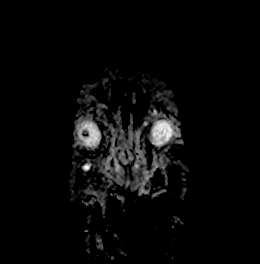

[Series 15: T1 · sagittal · 5.0mm · 0.75mm/px · 2 of 25 slices shown]
[im 1/25]
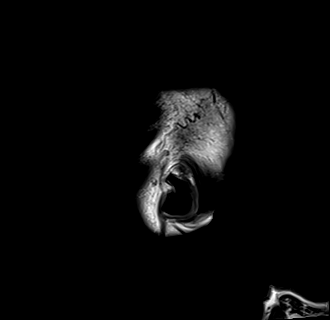
[im 25/25]
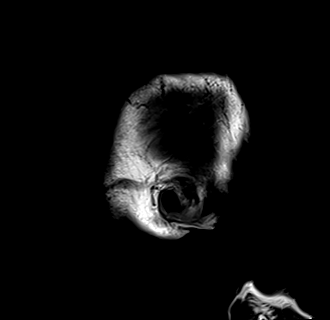

[Series 16: FLAIR · axial · 5.0mm · 0.90mm/px · z∈[-24,+114]mm · 2 of 27 slices shown]
[im 1/27]
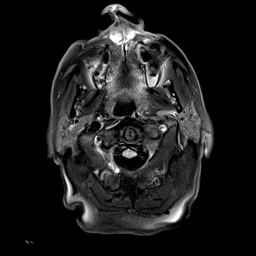
[im 27/27]
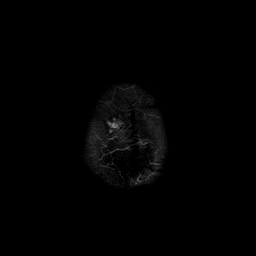

[Series 17: T2 · axial · 5.0mm · 0.72mm/px · z∈[-24,+114]mm · 2 of 27 slices shown (1 of 2)]
[im 1/27]
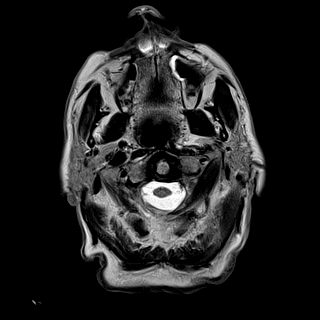
[im 27/27]
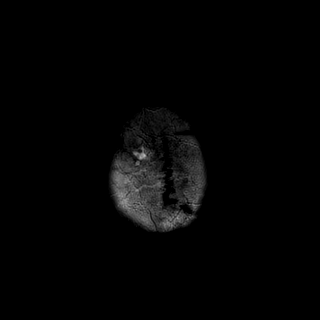

[Series 18: mag_images · axial · 3.0mm · 0.90mm/px · z∈[-29,+117]mm · 4 of 56 slices shown]
[im 1/56]
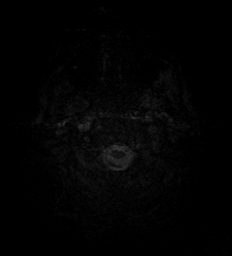
[im 19/56]
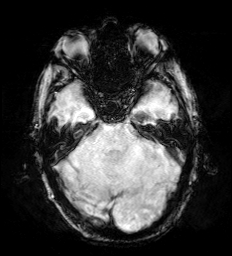
[im 37/56]
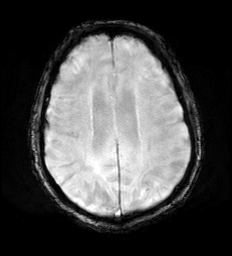
[im 56/56]
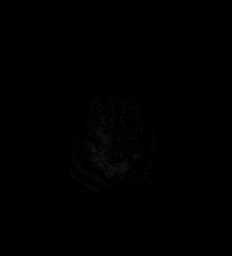

[Series 19: pha_images · axial · 3.0mm · 0.90mm/px · z∈[-29,+114]mm · 4 of 55 slices shown]
[im 1/55]
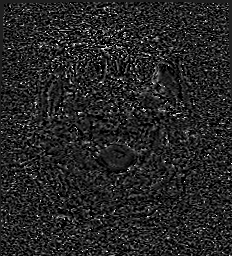
[im 19/55]
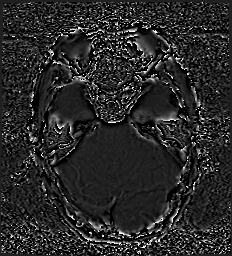
[im 37/55]
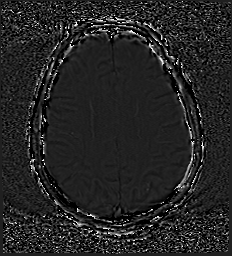
[im 55/55]
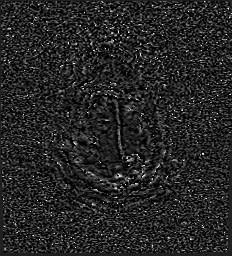

[Series 20: swi_images · axial · 3.0mm · 0.90mm/px · z∈[-29,+117]mm · 4 of 56 slices shown]
[im 1/56]
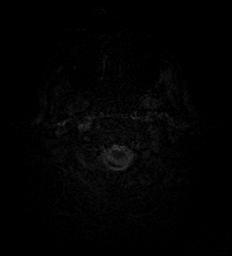
[im 19/56]
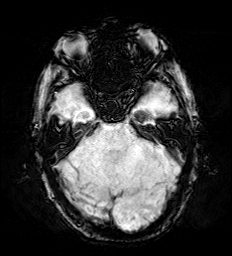
[im 37/56]
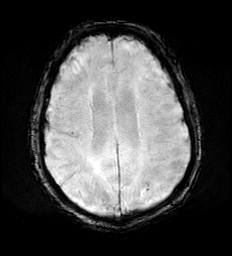
[im 56/56]
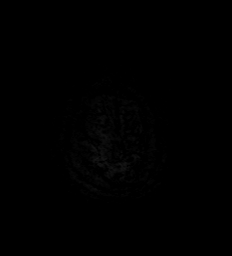

[Series 21: mip_images(sw) · axial · 24.0mm · 0.90mm/px · z∈[-19,+108]mm · 4 of 49 slices shown]
[im 1/49]
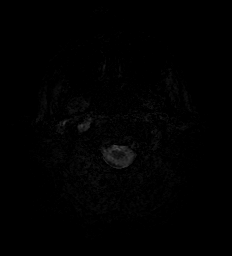
[im 17/49]
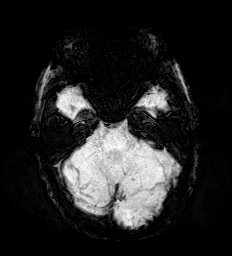
[im 33/49]
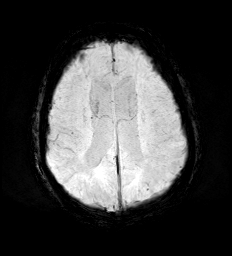
[im 49/49]
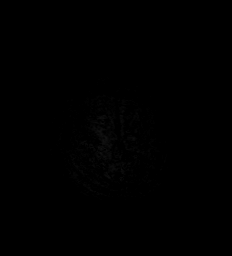

[Series 23: T2 · coronal · 5.0mm · 0.72mm/px · 2 of 32 slices shown (2 of 2)]
[im 1/32]
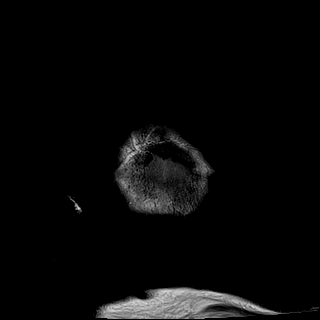
[im 32/32]
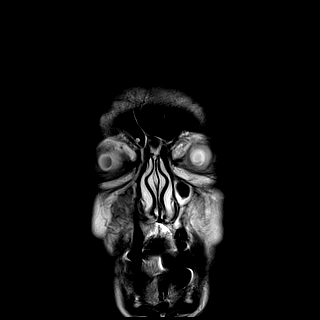

[44 of 48 positions shown; findings below may reference images not displayed]

FINDINGS: Brain: There are bilateral watershed zone infarcts affecting both
cerebral and cerebellar hemispheres. Largest confluent area of
ischemia is in the right occipital lobe. Multifocal hyperintense
T2-weighted signal within the white matter. There is generalized
atrophy without lobar predilection. Multiple foci of chronic
hemorrhage in the lateral right cerebellum. Normal midline
structures.

Vascular: Normal flow voids.

Skull and upper cervical spine: Normal marrow signal.

Sinuses/Orbits: Negative.  Bilateral scleral bands.

Other: None
IMPRESSION: 1. Extensive bilateral watershed zone infarcts affecting both
cerebral and cerebellar hemispheres. Largest confluent area of
ischemia is in the right occipital lobe.
2. No acute hemorrhage or mass effect.
3. Generalized atrophy and chronic small vessel ischemic changes.
